# Patient Record
Sex: Male | Born: 1960
Health system: Southern US, Community
[De-identification: ages and names within clinical notes are randomized; demographics above are authoritative.]

## PROBLEM LIST (undated history)

## (undated) DIAGNOSIS — G4730 Sleep apnea, unspecified: Secondary | ICD-10-CM

## (undated) DIAGNOSIS — Z951 Presence of aortocoronary bypass graft: Secondary | ICD-10-CM

## (undated) DIAGNOSIS — I2510 Atherosclerotic heart disease of native coronary artery without angina pectoris: Secondary | ICD-10-CM

## (undated) DIAGNOSIS — E785 Hyperlipidemia, unspecified: Secondary | ICD-10-CM

## (undated) DIAGNOSIS — I10 Essential (primary) hypertension: Secondary | ICD-10-CM

## (undated) DIAGNOSIS — K4090 Unilateral inguinal hernia, without obstruction or gangrene, not specified as recurrent: Secondary | ICD-10-CM

## (undated) DIAGNOSIS — T7840XA Allergy, unspecified, initial encounter: Secondary | ICD-10-CM

## (undated) HISTORY — DX: Allergy, unspecified, initial encounter: T78.40XA

## (undated) HISTORY — DX: Presence of aortocoronary bypass graft: Z95.1

## (undated) HISTORY — DX: Essential (primary) hypertension: I10

## (undated) HISTORY — PX: OTHER SURGICAL HISTORY: SHX169

## (undated) HISTORY — DX: Sleep apnea, unspecified: G47.30

## (undated) HISTORY — DX: Unilateral inguinal hernia, without obstruction or gangrene, not specified as recurrent: K40.90

## (undated) HISTORY — PX: HERNIA REPAIR: SHX51

## (undated) HISTORY — DX: Atherosclerotic heart disease of native coronary artery without angina pectoris: I25.10

## (undated) HISTORY — PX: FRACTURE SURGERY: SHX138

## (undated) HISTORY — DX: Hyperlipidemia, unspecified: E78.5

---

## 1990-11-02 ENCOUNTER — Encounter: Payer: Self-pay | Admitting: Pulmonary Disease

## 1990-12-10 ENCOUNTER — Encounter: Payer: Self-pay | Admitting: Pulmonary Disease

## 1999-02-26 DIAGNOSIS — C4492 Squamous cell carcinoma of skin, unspecified: Secondary | ICD-10-CM

## 1999-02-26 HISTORY — DX: Squamous cell carcinoma of skin, unspecified: C44.92

## 2001-02-02 ENCOUNTER — Ambulatory Visit (HOSPITAL_BASED_OUTPATIENT_CLINIC_OR_DEPARTMENT_OTHER): Admission: RE | Admit: 2001-02-02 | Discharge: 2001-02-02 | Payer: Self-pay | Admitting: Otolaryngology

## 2004-06-12 ENCOUNTER — Encounter: Admission: RE | Admit: 2004-06-12 | Discharge: 2004-09-10 | Payer: Self-pay | Admitting: Family Medicine

## 2007-02-07 ENCOUNTER — Encounter: Payer: Self-pay | Admitting: Vascular Surgery

## 2007-02-07 ENCOUNTER — Inpatient Hospital Stay (HOSPITAL_COMMUNITY): Admission: EM | Admit: 2007-02-07 | Discharge: 2007-02-09 | Payer: Self-pay | Admitting: *Deleted

## 2007-02-07 ENCOUNTER — Ambulatory Visit: Payer: Self-pay | Admitting: Vascular Surgery

## 2007-06-29 ENCOUNTER — Ambulatory Visit: Payer: Self-pay | Admitting: Thoracic Surgery (Cardiothoracic Vascular Surgery)

## 2007-06-29 ENCOUNTER — Encounter: Payer: Self-pay | Admitting: Thoracic Surgery (Cardiothoracic Vascular Surgery)

## 2007-06-29 ENCOUNTER — Inpatient Hospital Stay (HOSPITAL_COMMUNITY): Admission: AD | Admit: 2007-06-29 | Discharge: 2007-07-06 | Payer: Self-pay | Admitting: Cardiovascular Disease

## 2007-06-30 ENCOUNTER — Encounter: Payer: Self-pay | Admitting: Thoracic Surgery (Cardiothoracic Vascular Surgery)

## 2007-07-08 ENCOUNTER — Ambulatory Visit: Payer: Self-pay | Admitting: Thoracic Surgery (Cardiothoracic Vascular Surgery)

## 2007-07-13 ENCOUNTER — Ambulatory Visit: Payer: Self-pay | Admitting: Cardiothoracic Surgery

## 2007-07-23 ENCOUNTER — Encounter (HOSPITAL_COMMUNITY): Admission: RE | Admit: 2007-07-23 | Discharge: 2007-10-07 | Payer: Self-pay | Admitting: Cardiovascular Disease

## 2007-07-31 ENCOUNTER — Ambulatory Visit: Payer: Self-pay | Admitting: Cardiothoracic Surgery

## 2007-07-31 ENCOUNTER — Encounter
Admission: RE | Admit: 2007-07-31 | Discharge: 2007-07-31 | Payer: Self-pay | Admitting: Thoracic Surgery (Cardiothoracic Vascular Surgery)

## 2007-10-08 ENCOUNTER — Encounter (HOSPITAL_COMMUNITY): Admission: RE | Admit: 2007-10-08 | Discharge: 2007-10-23 | Payer: Self-pay | Admitting: Cardiovascular Disease

## 2008-03-28 ENCOUNTER — Encounter: Admission: RE | Admit: 2008-03-28 | Discharge: 2008-03-28 | Payer: Self-pay | Admitting: Cardiovascular Disease

## 2008-11-14 ENCOUNTER — Ambulatory Visit (HOSPITAL_COMMUNITY): Admission: RE | Admit: 2008-11-14 | Discharge: 2008-11-14 | Payer: Self-pay | Admitting: General Surgery

## 2010-09-13 ENCOUNTER — Encounter: Payer: Self-pay | Admitting: Pulmonary Disease

## 2010-09-13 ENCOUNTER — Ambulatory Visit: Payer: Self-pay | Admitting: Cardiovascular Disease

## 2010-09-24 ENCOUNTER — Ambulatory Visit: Payer: Self-pay | Admitting: Pulmonary Disease

## 2010-09-24 DIAGNOSIS — G4733 Obstructive sleep apnea (adult) (pediatric): Secondary | ICD-10-CM | POA: Insufficient documentation

## 2010-09-24 DIAGNOSIS — J309 Allergic rhinitis, unspecified: Secondary | ICD-10-CM | POA: Insufficient documentation

## 2010-09-24 DIAGNOSIS — I10 Essential (primary) hypertension: Secondary | ICD-10-CM | POA: Insufficient documentation

## 2010-09-24 DIAGNOSIS — E785 Hyperlipidemia, unspecified: Secondary | ICD-10-CM | POA: Insufficient documentation

## 2010-09-25 ENCOUNTER — Encounter: Payer: Self-pay | Admitting: Pulmonary Disease

## 2010-09-25 ENCOUNTER — Ambulatory Visit (HOSPITAL_BASED_OUTPATIENT_CLINIC_OR_DEPARTMENT_OTHER)
Admission: RE | Admit: 2010-09-25 | Discharge: 2010-09-25 | Payer: Self-pay | Source: Home / Self Care | Attending: Pulmonary Disease | Admitting: Pulmonary Disease

## 2010-10-15 ENCOUNTER — Ambulatory Visit: Admit: 2010-10-15 | Payer: Self-pay | Admitting: Pulmonary Disease

## 2010-10-18 ENCOUNTER — Ambulatory Visit
Admission: RE | Admit: 2010-10-18 | Discharge: 2010-10-18 | Payer: Self-pay | Source: Home / Self Care | Attending: Pulmonary Disease | Admitting: Pulmonary Disease

## 2010-11-08 NOTE — Assessment & Plan Note (Signed)
Summary: consult for management of osa   Copy to:  Kristeen Miss Primary Provider/Referring Provider:  Theresia Lo  CC:  Sleep Consult.  History of Present Illness: The pt is a 49y/o male who I have been asked to see for management of osa.  He was diagnosed with osa of unknown severity in 2002, and started on cpap with success.  After his bypass surgery, the patient has lost over 135 pounds over the last 18mos.  Since that time, it has been difficult to wear cpap due to poorly fitting mask with leaks.  He currently is wearing about 5 times a month.  His wife states that he is still snoring, but it is unclear if he is still having an abnormal breathing pattern during the night.  When he does wear cpap, he thinks it does help his sleep.  Without cpap, he does feel relatively rested, but will have EDS if he is not being stimulated during the day.  He is going to bed at 11-36mn, and arises at 7am to start his day.  Again, if he sits and gets very quiet, he will get sleepy.  He can doze with tv/movies in the evening if he is uninterested.  He has no issues with driving.  Preventive Screening-Counseling & Management  Alcohol-Tobacco     Smoking Status: never  Current Medications (verified): 1)  Simvastatin 20 Mg Tabs (Simvastatin) .... Take 1 Tablet By Mouth Once A Day 2)  Aspirin 325 Mg Tabs (Aspirin) .... Take 1 Tablet By Mouth Once A Day 3)  Zyrtec Allergy 10 Mg Tabs (Cetirizine Hcl) .... Take 1 Tablet By Mouth Once A Day  Allergies (verified): 1)  ! Ceclor  Past History:  Past Medical History: CAD HYPERTENSION (ICD-401.9) ALLERGIC RHINITIS (ICD-477.9) HYPERLIPIDEMIA (ICD-272.4) OBSTRUCTIVE SLEEP APNEA (ICD-327.23)    Past Surgical History: heart bypass 06/2007 at [facility] Hernia repair Feb 2010  Family History: Reviewed history and no changes required. none per pt  Social History: Reviewed history and no changes required. Patient never smoked.  pt is married and lives with wife and  family. pt works as an Art gallery manager. Smoking Status:  never  Review of Systems  The patient denies shortness of breath with activity, shortness of breath at rest, productive cough, non-productive cough, coughing up blood, chest pain, irregular heartbeats, acid heartburn, indigestion, loss of appetite, weight change, abdominal pain, difficulty swallowing, sore throat, tooth/dental problems, headaches, nasal congestion/difficulty breathing through nose, sneezing, itching, ear ache, anxiety, depression, hand/feet swelling, joint stiffness or pain, rash, change in color of mucus, and fever.    Vital Signs:  Patient profile:   50 year old male Height:      69 inches Weight:      162 pounds BMI:     24.01 O2 Sat:      99 % on Room air Temp:     97.7 degrees F oral Pulse rate:   71 / minute BP sitting:   128 / 84  (left arm) Cuff size:   regular  Vitals Entered By: Arman Filter LPN (September 24, 2010 8:53 AM)  O2 Flow:  Room air CC: Sleep Consult Comments Medications reviewed with patient Arman Filter LPN  September 24, 2010 8:53 AM    Physical Exam  General:  wd male in nad Eyes:  PERRLA and EOMI.   Nose:  deviated septum to left with narrowing. no definite obstruction. Mouth:  normal palate and uvula Neck:  no jvd, tmg, LN Lungs:  clear to auscultation Heart:  rrr,  no mrg Abdomen:  soft and nontender, bs+ Extremities:  no edema on left, mild right ankle edema. No cyanosis, pulses intact distally Neurologic:  alert, not sleepy, moves all 4.   Impression & Recommendations:  Problem # 1:  OBSTRUCTIVE SLEEP APNEA (ICD-327.23) the pt has a h/o osa of unknown severity, but has lost over 135 pounds.  His symptoms are much improved, but he still has snoring with some daytime sleepiness with periods of inactivity.  It is really unclear whether sleep apnea is still an issue for him, and to what degree.  He is willing to try cpap again with updated equipment if that is his only option.   However, he would like to look at other options if he still has clinically significant sleep apnea.  Will set up for a sleep study, and arrange f/u once the results are available.  Medications Added to Medication List This Visit: 1)  Simvastatin 20 Mg Tabs (Simvastatin) .... Take 1 tablet by mouth once a day 2)  Aspirin 325 Mg Tabs (Aspirin) .... Take 1 tablet by mouth once a day 3)  Zyrtec Allergy 10 Mg Tabs (Cetirizine hcl) .... Take 1 tablet by mouth once a day  Other Orders: Consultation Level IV (78295) Sleep Disorder Referral (Sleep Disorder)  Patient Instructions: 1)  will set up for a sleep study to evaluate your degree of sleep apnea at this time. 2)  will arrange followup once results available.

## 2010-11-08 NOTE — Letter (Signed)
Summary: [facility]   Imported By: Sherian Rein 10/02/2010 11:24:52  _____________________________________________________________________  External Attachment:    Type:   Image     Comment:   External Document

## 2010-11-08 NOTE — Assessment & Plan Note (Signed)
Summary: rov for review of recent sleep study   Copy to:  Kristeen Miss Primary Provider/Referring Provider:  Theresia Lo  CC:  Ov to discuss sleep study results. .  History of Present Illness: The pt comes in today for f/u of his recent sleep study.  He was found to have an AHI 18/hr with mild desat to 81%.  I have reviewed the study in detail with him, and answered all of his questions.    Medications Prior to Update: 1)  Simvastatin 20 Mg Tabs (Simvastatin) .... Take 1 Tablet By Mouth Once A Day 2)  Aspirin 325 Mg Tabs (Aspirin) .... Take 1 Tablet By Mouth Once A Day 3)  Zyrtec Allergy 10 Mg Tabs (Cetirizine Hcl) .... Take 1 Tablet By Mouth Once A Day  Allergies (verified): 1)  ! Ceclor  Review of Systems  The patient denies shortness of breath with activity, shortness of breath at rest, productive cough, non-productive cough, coughing up blood, chest pain, irregular heartbeats, acid heartburn, indigestion, loss of appetite, weight change, abdominal pain, difficulty swallowing, sore throat, tooth/dental problems, headaches, nasal congestion/difficulty breathing through nose, sneezing, itching, ear ache, anxiety, depression, hand/feet swelling, joint stiffness or pain, rash, change in color of mucus, and fever.    Vital Signs:  Patient profile:   50 year old male Height:      69 inches Weight:      164.13 pounds BMI:     24.33 O2 Sat:      100 % on Room air Temp:     98.4 degrees F oral Pulse rate:   80 / minute BP sitting:   128 / 78  (left arm) Cuff size:   regular  Vitals Entered By: Arman Filter LPN (October 18, 2010 4:01 PM)  O2 Flow:  Room air CC: Ov to discuss sleep study results.  Comments Medications reviewed with patient Arman Filter LPN  October 18, 2010 4:01 PM    Physical Exam  General:  thin male in nad Nose:  no skin breakdown or pressure necrosis from cpap mask Extremities:  no edema or cyanosis  Neurologic:  alert, does not appear sleepy, moves all  4.   Impression & Recommendations:  Problem # 1:  OBSTRUCTIVE SLEEP APNEA (ICD-327.23) the pt has mild osa by his most recent sleep study, and I have reviewed the various treatment options available for him.  One is to stop cpap and try to stay off back as much as possible (his apnea was primarily in the supine position).  The other options would be to consider surgery, dental appliance, and remaining on cpap.  I really think a dental appliance would be his best option if he wishes to try and come off cpap.  The pt feels he is symptomatic enough to continue aggressive treatment of his osa, and will therefore send him for a dental evaluation.  Other Orders: Est. Patient Level III (02725) Dental Referral (Dentist)  Patient Instructions: 1)  will refer you for a dental evaluation. 2)  please let me know if you would like to consider ENT evaluation as well. 3)  try to sleep on side or abdomen as much as possible.   Immunization History:  Influenza Immunization History:    Influenza:  historical (08/07/2010)

## 2011-01-22 LAB — COMPREHENSIVE METABOLIC PANEL
ALT: 16 U/L (ref 0–53)
AST: 17 U/L (ref 0–37)
Albumin: 3.7 g/dL (ref 3.5–5.2)
Alkaline Phosphatase: 50 U/L (ref 39–117)
BUN: 14 mg/dL (ref 6–23)
CO2: 30 mEq/L (ref 19–32)
Calcium: 9.2 mg/dL (ref 8.4–10.5)
Chloride: 107 mEq/L (ref 96–112)
Creatinine, Ser: 1.15 mg/dL (ref 0.4–1.5)
GFR calc Af Amer: >60 mL/min (ref >60)
GFR calc non Af Amer: >60 mL/min (ref >60)
Glucose, Bld: 93 mg/dL (ref 70–99)
Potassium: 3.9 mEq/L (ref 3.5–5.1)
Sodium: 141 mEq/L (ref 135–145)
Total Bilirubin: 1.2 mg/dL (ref 0.3–1.2)
Total Protein: 5.9 g/dL — ABNORMAL LOW (ref 6.0–8.3)

## 2011-01-22 LAB — DIFFERENTIAL
Basophils Absolute: 0 10*3/uL (ref 0.0–0.1)
Basophils Relative: 1 % (ref 0–1)
Eosinophils Absolute: 0.1 10*3/uL (ref 0.0–0.7)
Eosinophils Relative: 3 % (ref 0–5)
Lymphocytes Relative: 30 % (ref 12–46)
Lymphs Abs: 1.6 10*3/uL (ref 0.7–4.0)
Monocytes Absolute: 0.4 10*3/uL (ref 0.1–1.0)
Monocytes Relative: 8 % (ref 3–12)
Neutro Abs: 3 10*3/uL (ref 1.7–7.7)
Neutrophils Relative %: 58 % (ref 43–77)

## 2011-01-22 LAB — CBC
HCT: 40.4 % (ref 39.0–52.0)
Hemoglobin: 13.7 g/dL (ref 13.0–17.0)
MCHC: 33.9 g/dL (ref 30.0–36.0)
MCV: 92.3 fL (ref 78.0–100.0)
Platelets: 156 10*3/uL (ref 150–400)
RBC: 4.37 MIL/uL (ref 4.22–5.81)
RDW: 13.4 % (ref 11.5–15.5)
WBC: 5.2 10*3/uL (ref 4.0–10.5)

## 2011-01-22 LAB — PROTIME-INR
INR: 1 (ref 0.00–1.49)
Prothrombin Time: 13.5 seconds (ref 11.6–15.2)

## 2011-02-19 NOTE — Op Note (Signed)
NAME:  [name], [name] NO.:  [id number]   MEDICAL RECORD NO.:  [id number]          PATIENT TYPE:  INP   LOCATION:  2302                         FACILITY:  [facility]   PHYSICIAN:  Bedelia Person, M.D.        DATE OF BIRTH:  January 30, 1961   DATE OF PROCEDURE:  07/02/2007  DATE OF DISCHARGE:                               OPERATIVE REPORT   Prior to surgical procedure the patient was quizzed about possible  esophageal or gastric medical conditions.  He denies all such.  The  patient was taken to the operating room and induced with general  anesthesia.  The airway was secured with an oral endotracheal tube.  Gastric contents were suctioned with an oral gastric tube which was then  removed.  The transesophageal probe was heavily lubricated, placed in a  sleeve which was also lubricated and then placed blindly down the  oropharynx palpebral pharynx without resistance.  The remained at the 45  to 50 cm mark throughout the case.  At completion of the case the probe  was removed.  There was no evidence for oral or pharyngeal damage.  During the bypass period, the probe was left in the neutral unflexed  position.  Prebypass examination revealed the left ventricle to be  normal thickness.  Contractility was good.  There were no segmental  defects.  The left atrium was normal size.  The appendage was clean.  The inner atrial septum was intact.  The mitral valve leaflets were  opening and closing appropriately.  They were thin with no masses noted.  There was no calcification of the annulus.  The leaflets appeared to be  coapting within the valvular plane.  Color Doppler revealed no mitral  insufficiency.  The aortic valve had three leaflets.  There was no  calcification noted.  All three leaflets opened and closed  appropriately.  Color Doppler revealed no aortic insufficiency.  Tricuspid valve appeared normal.  Swan-Ganz catheter was noted across  the valve.  Color Doppler did document  trace regurgitant flow again with  the Swan-Ganz catheter.  The patient underwent two-vessel coronary  artery bypass grafting off pump.  At the completion of the procedure,  left ventricle was very slightly more hypokinetic than at the start of  the procedure.  This quickly return to normal after completing the  procedure.  The postprocedure examination revealed no significant  changes from the initial examination __________ _           ______________________________  Bedelia Person, M.D.     LK/MEDQ  D:  07/02/2007  T:  07/03/2007  Job:  [id number]

## 2011-02-19 NOTE — Discharge Summary (Signed)
[name], [name] NO.:  [id number]   MEDICAL RECORD NO.:  [id number]          PATIENT TYPE:  INP   LOCATION:  2027                         FACILITY:  [facility]   PHYSICIAN:  Salvatore Decent. Cornelius Moras, M.D. DATE OF BIRTH:  01-Sep-1961   DATE OF ADMISSION:  06/29/2007  DATE OF DISCHARGE:  07/06/2007                               DISCHARGE SUMMARY   HISTORY OF PRESENT ILLNESS:  The patient is a 50 year old male, referred  to Dr. Elease Hashimoto for evaluation of chest pain.  He was found to have an  abnormal stress Cardiolite study.  He was admitted this hospitalization  for cardiac catheterization.  The patient had had a history of multiple  risk factors including hypertension and obesity.  He also has  gastroesophageal reflux.  Approximately a month prior to evaluation, he  started having episodes of chest pain that were felt to be somewhat  unusual.  They would typically come on after he had eaten after  approximately 1 hour.  It was also associated with tightness-type  sensation as well as shoulder and jaw discomfort.  He noticed over time  that the episodes would occurred during walking, and they were gradually  worsening.  He was recently on an antacid and the pain following meals  improved.  The pain associated with exertion did not.  He was admitted  this hospitalization, as stated, for cardiac catheterization.  The  Cardiolite study was notable for a marked defect in the anterior apical  and septal and septal walls.   MEDICATIONS PRIOR TO ADMISSION:  1. Aspirin 81 mg daily.  2. Hydrochlorothiazide 25 mg daily.  3. Lotensin once daily.  4. Prilosec 20 mg daily.   ALLERGIES:  CECLOR causes photosensitivity, but no true allergies noted.   PAST MEDICAL HISTORY:  1. Hypertension.  2. Reflux.  3. Abnormal Cardiolite study.   Family history, social history, review of symptoms and physical exam,  please see the history and physical done at time of admission.   HOSPITAL  COURSE:  The patient was admitted and did undergo cardiac  catheterization.  Findings included a 100% proximal occlusion the left  anterior descending coronary artery.  Due to this finding, an attempt  was made at percutaneous intervention but the guide wire could not cross  the lesion.  Due to this, cardiac surgical consultation was obtained  with Tressie Stalker, MD who evaluated the patient and studies, and  recommended proceeding with surgical revascularization.  The patient did  require a few days for attempted Plavix washout, but it was deemed that  on July 02, 2007 he was acceptable for proceeding with the surgical  intervention procedure.  On July 02, 2007, he was taken to the  operating room, at which time eh underwent the following procedure:  off-  pump coronary artery bypass grafting x2.  The following grafts placed:  1. Left internal mammary artery to the LAD.  2. Saphenous vein graft to diagonal.   The patient tolerated the procedure well, was taken to the surgical  intensive care unit in stable condition.   POSTOPERATIVE HOSPITAL COURSE:  The  patient was extubated without  difficulty.  He was has remained hemodynamically stable.  All routine  lines, monitors, drainage devices and inotropic support was discontinued  without difficulty.  Initially, he did require some atrial pacing for a  borderline sinus bradycardia, but this has improved and he has  maintained normal sinus rhythm without significant ectopy or  dysrhythmias.  His incisions are all healing well without evidence of  infection.  He is tolerating diet and activity.  Commensurate for level  of postoperative convalescence using standard protocols.  He is  afebrile.  His oxygen has been weaned.  He maintains good saturations on  room air.  He does have a mild postoperative anemia, but his laboratory  values are stable.  Most recent hemoglobin and hematocrit dated  July 06, 2007 are 11.6 and 34.2  respectively.  Electrolytes, BUN  and creatinine are all within normal limits.  His overall status is felt  to be quite stable for discharge on July 06, 2007.  The patient on  discharge stable and improving.   FINAL DIAGNOSIS:  Severe single-vessel coronary artery disease, now  status post surgical revascularization as described.  Other diagnoses  include hypertension, gastroesophageal reflux, hyperlipidemia, history  of obstructive sleep apnea, morbid obesity, mild postoperative anemia.   INSTRUCTIONS:  The patient received written instructions regarding  medications, activity, diet, wound care and followup.   Medications at time of this discharge include:  1. Aspirin 325 mg daily.  2. Toprol XL 25 mg daily.  3. Lasix 40 mg daily for 7 days.  4. K-Dur 20 mEq daily for 7 days.  5. Oxycodone 5 mg 1-2 q.4-6 hours p.r.n. as needed.  6. Lipitor 40 mg daily.  7. Prilosec 20 mg daily.      Rowe Clack, P.A.-C.      Salvatore Decent. Cornelius Moras, M.D.  Electronically Signed    WEG/MEDQ  D:  07/06/2007  T:  07/06/2007  Job:  [id number]   cc:   Salvatore Decent. Cornelius Moras, M.D.  Vesta Mixer, M.D.  Vikki Ports, M.D.

## 2011-02-19 NOTE — Op Note (Signed)
NAME:  [name], [name] NO.:  [id number]   MEDICAL RECORD NO.:  [id number]          PATIENT TYPE:  INP   LOCATION:  2302                         FACILITY:  [facility]   PHYSICIAN:  Salvatore Decent. Cornelius Moras, M.D. DATE OF BIRTH:  05-17-1961   DATE OF PROCEDURE:  07/02/2007  DATE OF DISCHARGE:                               OPERATIVE REPORT   PREOPERATIVE DIAGNOSIS:  Severe single-vessel coronary artery disease.   POSTOPERATIVE DIAGNOSIS:  Severe single-vessel coronary artery disease.   PROCEDURE:  Median sternotomy for off-pump coronary artery bypass  grafting x2 (left internal mammary artery to distal left anterior  descending coronary artery, saphenous vein graft to first diagonal  branch, endoscopic saphenous vein harvest from right thigh)   SURGEON:  Salvatore Decent. Cornelius Moras, M.D.   ASSISTANT:  Salvatore Decent. Dorris Fetch, M.D.   SECOND ASSISTANT:  Mr. Tamsen Roers.   ANESTHESIA:  General.   BRIEF CLINICAL NOTE:  The patient is a 50 year old obese white male with  no previous history of coronary artery disease who presents with  symptoms of substernal chest pain.  Some of the patient's symptoms are  atypical but the patient also has developed pain in the upper chest  radiating to the jaw with physical activity.  A stress test was  performed demonstrating anterior wall and apical ischemia.  The patient  underwent cardiac catheterization by Dr. Delane Ginger and was found to  have occlusion of the left anterior descending coronary artery with high-  grade stenosis of the first diagonal branch.  An attempt at percutaneous  coronary intervention was performed, but the occlusion of the left  anterior descending coronary artery could not be crossed and is  presumably chronic.  Left ventricular function is normal.  A full  consultation note has been dictated previously.  Alternative treatment  strategies have been discussed at length with the patient and his  family.  The relative risks and  benefits of proceeding with surgical  revascularization have been discussed.  The patient understands and  accepts all potential associated risks of surgery and desires to proceed  as described.   OPERATIVE FINDINGS:  1. Normal left ventricular function.  2. Good quality left internal mammary artery and saphenous vein      conduit for grafting.  3. Small caliber distal coronary vessels.   OPERATIVE NOTE:  The patient is brought to the operating room on the  above-mentioned date and central monitoring was established by the  anesthesia service under the care and direction of Dr. Bedelia Person.  Specifically, a Swan-Ganz catheter is placed through the right internal  jugular approach.  A radial arterial line is placed.  Intravenous  antibiotics were administered.  Following induction with general  endotracheal anesthesia, a Foley catheter is placed.  The patient's  chest, abdomen, both groins, and both lower extremities are prepared and  draped in sterile manner.  Baseline transesophageal echocardiogram was  performed by Dr. Gypsy Balsam.  This demonstrates normal left ventricular size  and function.  There are no valvular abnormalities to speak of.   A median sternotomy incision is performed and left internal mammary  artery is dissected from the chest wall and prepared for bypass  grafting.  The left internal mammary artery is good quality conduit.  Simultaneously saphenous vein is obtained from the patient's right thigh  using endoscopic vein harvest technique through a small incision made  just above the right knee.  The saphenous vein is somewhat large caliber  but otherwise good-quality conduit.  After the saphenous vein is been  removed, the small incision in the right thigh is closed in multiple  layers with running absorbable suture.  The patient is heparinized  systemically and the left internal mammary artery transected distally.  It is noted to have excellent flow.   The pericardium  is opened.  The ascending aorta is normal in appearance.  The Guidant Acrobat cardiac stabilization system is utilized to  facilitate off-pump coronary artery bypass surgery.  Both the apical  suction cup and the U-shaped stabilization bar are utilized.  Elastic  vessel loops are used for proximal and distal control and intracoronary  shunts are not utilized.  The patient is placed in steep Trendelenburg  position with the table rotated towards the surgeon's side to facilitate  exposure.  The patient tolerated construction of distal ends anastomoses  extremely well with no hemodynamic compromise.   The following distal coronary anastomoses were performed:  1. The first diagonal branch is grafted with a saphenous vein graft in      end-to-side fashion.  This vessel measures 1.2 mm in diameter and      is a fair quality target vessel for grafting.  2. The distal left anterior descending coronary artery is grafted with      left internal mammary artery in end-to-side fashion.  This vessel      measures 1.4 mm in diameter and is a fair to good quality target      vessel for grafting.   The table was returned to neutral position.  The single proximal  saphenous vein anastomoses is performed directly to the ascending aorta  using the Guidant heart string proximal punch and hemostatic system  without need for any clamp application to the aorta.  After the  completion of the anastomosis, protamine was administered to reverse  anticoagulation.  The proximal and both distal coronary anastomoses are  inspected for hemostasis and appropriate graft orientation.  Epicardial  pacing wires are fixed to the right atrial appendage.  The patient's  chest is irrigated with saline solution and meticulous surgical  hemostasis is ascertained.  The mediastinum and left chest are drained  using three chest tubes exited through separate stab incisions  inferiorly.  The sternum was closed with double-strength  sternal wire  after reapproximating the pericardium and soft tissues anterior to the  aorta loosely.  This soft tissues anterior to the sternum are closed in  multiple layers and the skin is closed with running subcuticular skin  closure.   The patient tolerated the procedure well and was transported to the  surgical intensive care unit in stable condition.  There are no  intraoperative complications.  All sponge, instrument and needle counts  were verified correct at completion of the operation.  No blood products  were administered.      Salvatore Decent. Cornelius Moras, M.D.  Electronically Signed     CHO/MEDQ  D:  07/02/2007  T:  07/03/2007  Job:  [id number]   cc:   Vesta Mixer, M.D.  Vikki Ports, M.D.

## 2011-02-19 NOTE — Assessment & Plan Note (Signed)
OFFICE VISIT   [name], [name]  DOB:  October 13, 1960                                        July 31, 2007  CHART #:  [id number]   HISTORY OF PRESENT ILLNESS:  The patient is a 50 year old male now 1  month status post coronary artery bypass grafting x2 by Dr. Cornelius Moras.  The  patient is a 50 year old obese white male with no previous history of  coronary disease who presented with increasing symptoms of substernal  chest pain.  Stress test was positive for anterior wall and apical  ischemia.  Cardiac catheterization confirmed 100% occlusion to the LAD.  This was not amenable to PTCA due to inability to pass the wire.  He  subsequently underwent surgical revascularization including left  internal mammary artery to the distal left anterior descending coronary  artery, and a saphenous vein graft to the first diagonal branch on  07/02/2007.  He was discharged on 07/06/2007 doing well.  He returns to  the office on today's date, continuing to do quite well.  He has minor  discomforts and is currently not using any pain medicine.  He describes  some symptoms of itching relating to the sternal incision.  The chest  tube sites have been slow to heal.  He has been packing them with wet-to-  dry dressings up until recently.  Now, he covers them with a dry sterile  dressing as they have shown significant healing with good granulation  tissue.  He has started outpatient cardiac rehabilitation.  He is  performing routine activities and ambulation.  He was seen by Dr. Elease Hashimoto  in cardiology followup, and there have been no significant changes since  that visit.   PHYSICAL EXAM:  Blood pressure 146/76, pulse 62 and regular,  respirations 18, oxygen saturation is 99% on room air.  In general, this  is a moderately obese male in no acute distress.  Pulmonary:  Clear  lungs.  Cardiac:  Regular rate and rhythm without murmurs, gallops, or  rubs.  Extremities:  Minimal edema.   Incisions are all healing well  without evidence of infection.   Chest x-ray is reviewed and reveals clear lungs with no abnormal  findings.   ASSESSMENT:  Continuing to do well following surgical revascularization  as described above.  He is instructed to continue his rehabilitation  activities.  He is informed that he can return to driving.  He is  instructed not to fly in an airplane, which he does frequently on  business, until the first portion of next year.  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  Return to the clinic will be on a p.r.n. basis  if he has any difficulties related to his incisions or other surgical  matters.  He will otherwise continue to follow up with Dr. Elease Hashimoto as he  requires for ongoing cardiology care.   The patient was seen and evaluated this visit by Dr. Kathlee Nations Trigt.   Rowe Clack, P.A.-C.   Sherryll Burger  D:  07/31/2007  T:  08/01/2007  Job:  [id number]   cc:   Salvatore Decent. Cornelius Moras, M.D.  Vesta Mixer, M.D.  Vikki Ports, M.D.

## 2011-02-19 NOTE — Cardiovascular Report (Signed)
NAME:  CHER, EGNOR NO.:  [id number]   MEDICAL RECORD NO.:  [id number]          PATIENT TYPE:  OIB   LOCATION:  2928                         FACILITY:  [facility]   PHYSICIAN:  Vesta Mixer, M.D. DATE OF BIRTH:  Feb 12, 1961   DATE OF PROCEDURE:  06/29/2007  DATE OF DISCHARGE:                            CARDIAC CATHETERIZATION   HISTORY OF PRESENT ILLNESS:   Mr. Spragg is a 50 year old gentleman with a history of angina for the  past 6 weeks.  He recently presented to me in the office.  He had a  stress Cardiolite study which revealed a marked septal anterior wall and  apical defect consistent with ischemia.  He is referred for heart  catheterization.   PROCEDURE:  Left heart catheterization with coronary angiography with  PTCA attempt to the LAD.   The right femoral artery was easily cannulated using modified Seldinger  technique.   HEMODYNAMIC RESULTS:  LV pressure is 116/16 with an aortic pressure of  113/67.   ANGIOGRAPHY:  Left main:  Left main is fairly normal.   The left anterior descending artery has 99% subtotal occlusion in the  proximal aspect.  This stenosis is located at the origin of first  diagonal vessel.  There is TIMI grade I flow down the LAD and diagonal  vessel.  The diagonal vessel originates in the middle of the vessel and  is a relatively small vessel.  The mid and distal LAD cannot be seen and  fill only very sluggishly the antegrade flow.   The ramus intermediate vessel is a moderate-sized vessel with minor  luminal irregularities.   The left circumflex artery is a large vessel.  There are minor luminal  irregularities.   The first obtuse marginal artery is relatively small.  The second obtuse  marginal artery is fairly large and normal.   The right coronary artery is a large and dominant vessel.  It is fairly  normal throughout its course.  The posterior descending artery and  posterolateral segment artery are normal.    There are collaterals from the right that fill via the septal branches  into the LAD.   PCI attempt:  We used multiple guiding catheter.  The best fit was a 6-  [place] XB LAD VISTA guiding wire.  We also used multiple wires, but were  unsuccessful in our attempts.  We tried a [employer] wire Luge [employer].  This Ginger Blue gave [place] the most favorable approach but  again was unsuccessful.   After an hour and a half of attempts, we decided to stop the case.   At the end of case, there was no flow down the LAD.  The patient does  have collaterals from the right coronary artery and is stable.  There is  no increase in his chest pain.  We will refer him for coronary artery  bypass grafting.   The patient did receive 600 mg of Plavix at the beginning of the case.  He also received AngioMax which has now been discontinued.  ______________________________  Vesta Mixer, M.D.     PJN/MEDQ  D:  06/29/2007  T:  06/29/2007  Job:  [id number]   cc:   Dr. Cornelius Moras

## 2011-02-19 NOTE — Op Note (Signed)
NAME:  JAMISON, SOWARD NO.:  [id number]   MEDICAL RECORD NO.:  [id number]          PATIENT TYPE:  AMB   LOCATION:  DAY                          FACILITY:  [facility]   PHYSICIAN:  Adolph Pollack, M.D.DATE OF BIRTH:  03-02-1961   DATE OF PROCEDURE:  11/14/2008  DATE OF DISCHARGE:                               OPERATIVE REPORT   PREOPERATIVE DIAGNOSIS:  Right inguinal hernia.   POSTOPERATIVE DIAGNOSIS:  Right inguinal hernia.   PROCEDURE:  Right inguinal hernia repair with mesh.   SURGEON:  Adolph Pollack, M.D.   ANESTHESIA:  General/LMA and Marcaine local.   INDICATIONS:  This 50 year old male noted some discomfort and swelling  in the right groin area.  He has a right inguinal hernia that is  symptomatic and reducible on exam.  He now presents for repair.  The  procedure and the risks were discussed with him preop.  We also talked  about aftercare.   TECHNIQUE:  He was seen in the holding area and the right groin marked  with my initials.  He was then brought to the operating room, placed  supine on the operating room table and he was given general anesthesia  by way of LMA.  The hair on the left groin was clipped and the area was  sterilely prepped and draped.  Marcaine solution was infiltrated  superficially and deep in the right groin.  A right groin incision was  made through the skin and subcutaneous tissue until the external oblique  aponeurosis was exposed.  Local anesthetic was infiltrated deep to the  external oblique aponeurosis.  The external oblique aponeurosis was then  incised through the external ring medially and up toward the anterior  superior iliac spine laterally.  Using blunt dissection, I identified  the internal oblique aponeurosis and muscle superiorly and the shelving  edge of the inguinal ligament inferiorly.  I then encircled the  spermatic cord.  I noted an indirect hernia, as well as a direct hernia.   I separated the  indirect hernia contents from the cord and also  separated the direct hernia sac from the cord.  I reduced the indirect  hernia contents consisting of the extraperitoneal fat through a patulous  internal ring.  I then retracted the cord structures anteriorly.   A piece of 3 x 6 inches polypropylene mesh was brought into the field  and anchored 2 cm medial to the pubic tubercle with 2-0 Prolene suture.  The inferior aspect of the mesh was anchored to the shelving edge of the  inguinal ligament with a running 2-0 Prolene suture up to the level 2 cm  lateral to the internal ring.  A slit was cut in the mesh creating two  tails which were wrapped around the spermatic cord.  The superior aspect  of the mesh was anchored to the internal oblique aponeurosis with  interrupted 2-0 Vicryl sutures.  The two tails of the mesh were crossed  creating a new internal ring and these were anchored to the shelving  edge of the inguinal ligament with a 2-0 Prolene suture.  The tip of a  hemostat was able to be placed through the new aperture.   The lateral aspect of the mesh was then tucked deep to the external  oblique aponeurosis.  The wound was inspected and hemostasis was  adequate.  The ilioinguinal nerve was kept with the spermatic cord  structures.   Following this, the external oblique aponeurosis was closed over the  mesh and spermatic cord with running 3-0 Vicryl suture.  The  subcutaneous tissue was reapproximated with a running 2-0 Vicryl suture.  The skin was closed with 4-0 Monocryl subcuticular stitch.  Steri-Strips  and sterile dressings were applied.  He tolerated the procedure without  any apparent complications and was taken to the recovery room in  satisfactory condition.  The right testicle was in the scrotum.      Adolph Pollack, M.D.  Electronically Signed     TJR/MEDQ  D:  11/14/2008  T:  11/14/2008  Job:  [id number]   cc:   Vikki Ports, M.D.  Fax: [phone]    Vesta Mixer, M.D.  Fax: [phone]

## 2011-02-19 NOTE — Consult Note (Signed)
NAME:  [name], [name] NO.:  [id number]   MEDICAL RECORD NO.:  [id number]          PATIENT TYPE:  OIB   LOCATION:  2928                         FACILITY:  [facility]   PHYSICIAN:  [name], M.D. DATE OF BIRTH:  11/07/1960   DATE OF CONSULTATION:  06/29/2007  DATE OF DISCHARGE:                                 CONSULTATION   CARDIOTHORACIC SURGERY CONSULTATION:   REASON FOR CONSULTATION:  Critical single-vessel coronary artery  disease.   REFERRING PHYSICIAN:  Vesta Petersen, M.D.   HISTORY OF PRESENT ILLNESS:  [name] is a 50 year old obese white  male with no previous history of coronary artery disease, who describes  a 1-54-month history of intermittent episodes of atypical chest pain.  The patient initially developed epigastric burning pain that seemed to  be associated with meals.  He had had some problems with bowel function  earlier in the summer and he thought this was probably GI-related.  However, more recently he developed some chest tightness and shoulder  and jaw pain while walking, and these symptoms have gradually gotten  worse.  He was initially seen by his primary care physician, [name], and he was started on oral Prilosec.  He was referred for a  cardiac evaluation and he underwent a stress Cardiolite exam that was  markedly abnormal and consistent with anterior wall ischemia.  He  subsequently underwent elective cardiac catheterization today by Dr.  Elease Petersen.  He was found to have 100% proximal occlusion of the left  anterior descending coronary artery.  He was loaded with Plavix and an  attempt to cross this lesion was performed, but the guidewire could not  be passed across the lesion.  Cardiac surgical consultation has now been  requested.   REVIEW OF SYSTEMS:  GENERAL:  The patient reports stable appetite.  He  is 5 feet 9 inches tall and weighs in excess of 260 pounds.  CARDIAC:  Notable for intermittent symptoms of  burning substernal and epigastric  pain as well as tightness in the upper chest and jaw.  The tightness in  upper chest and jaw seems to be more related to physical activity,  whereas the burning epigastric pain is more related to meals.  The  patient reports mild exertional shortness of breath, which is stable.  The patient denies severe exertional shortness of breath.  The patient  denies resting shortness of breath.  He reports no PND, orthopnea, or  lower extremity edema.  He has not had any palpitations or syncope.  RESPIRATORY:  Notable for exertional shortness of breath.  The patient  also has obstructive sleep apnea and uses CPAP at night.  He denies  productive cough, hemoptysis, wheezing.  GASTROINTESTINAL:  Notable for  postprandial burning epigastric pain.  The patient denies difficulty  swallowing.  The patient reports no hematochezia, hematemesis, nor  melena.  MUSCULOSKELETAL:  Negative.  GENITOURINARY:  Negative.  HEENT:  Negative.  PSYCHIATRIC:  Negative.   PAST MEDICAL HISTORY:  1. Hypertension.  2. GE reflux disease.  3. Hyperlipidemia.  4. Obstructive sleep apnea.  5. Obesity.  PAST SURGICAL HISTORY:  Excision of fistula in ano.   FAMILY HISTORY:  Noncontributory.   SOCIAL HISTORY:  The patient is married and lives with his wife and one  of their five children here in [place].  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  He is a nonsmoker and he denies alcohol  consumption.   MEDICATIONS PRIOR TO ADMISSION:  1. Aspirin 81 mg daily.  2. Hydrochlorothiazide 25 mg daily.  3. Lotensin 10 mg daily.  4. Prilosec 20 mg daily  5. Lipitor 40 mg daily.   DRUG ALLERGIES:  CECLOR causes photosensitivity.   PHYSICAL EXAM:  The patient is a well-appearing, obese white male who  appears his stated age, in no acute distress.  He is in sinus rhythm and blood pressure is 135/83.  HEENT:  Unrevealing.  NECK:  Supple.  There is no cervical nor  supraclavicular  lymphadenopathy.  There is no jugular venous distention.  No carotid  bruits are noted.  Auscultation of the chest demonstrates clear breath sounds which are  symmetrical bilaterally.  No wheezes or rhonchi are noted.  CARDIOVASCULAR:  Regular rate and rhythm.  No murmurs, rubs or gallops  are noted.  ABDOMEN:  Soft, nontender.  Bowel sounds are present.  EXTREMITIES:  Warm and well-perfused.  There is no lower extremity  edema.  Distal pulses are palpable in the dorsalis pedis position.  There is no sign of significant venous insufficiency.  SKIN:  Clean, dry, and healthy-appearing throughout.Marland Kitchen  RECTAL:  Deferred.  GENITOURINARY:  Deferred.  NEUROLOGIC:  Grossly nonfocal and symmetrical throughout.   DIAGNOSTIC TESTS:  Cardiac catheterization performed by Dr. Elease Petersen is  reviewed.  This demonstrates 100% occlusion of the mid left anterior  descending coronary artery.  The distal vessel fills via left-to-left  collaterals and appears to a be reasonably good target vessel.  There is  subtotal occlusion of the large first diagonal branch as well and  filling of this is decreased after attempted percutaneous coronary  intervention.  There is no significant left circumflex or right coronary  artery disease.  Left ventricular function appears normal.   IMPRESSION:  Severe, critical left anterior descending coronary artery  stenosis with progressive symptoms of chest pain consistent with angina  pectoris as well as other additional symptoms of atypical chest pain  that could be cardiac-related or may well also represent  gastroesophageal reflux symptoms.  I believe the patient would benefit  from surgical revascularization.   PLAN:  I have discussed options at length with [name] and his  family.  The relative risks and benefits of surgery have been contrasted  with continued medical therapy.  All of their questions have been  addressed.  We tentatively plan to  proceed with surgery later this week  in an effort to allow the effects of Plavix to dissipate from his  system.  However, I am concerned regarding the potential compromise of  the large first diagonal branch and if the patient has truly symptoms  compatible with unstable angina uncontrolled with intravenous  nitroglycerin, we could proceed with surgery sooner if need be.  I have  discussed these matters with Mr. Leabo.  They understand  and accept all potential associated risks of surgery including but not  limited to risk of death, stroke, myocardial infarction, congestive  heart failure, respiratory failure, pneumonia, bleeding requiring blood  transfusion, arrhythmia, infection, and recurrent coronary artery  disease.  All of their questions have been  addressed.      [name], M.D.  Electronically Signed     CHO/MEDQ  D:  06/29/2007  T:  06/30/2007  Job:  [id number]   cc:   [name], M.D.  Vikki Ports, M.D.

## 2011-02-19 NOTE — H&P (Signed)
NAME:  [name], [name] NO.:  [id number]   MEDICAL RECORD NO.:  [id number]          PATIENT TYPE:  OIB   LOCATION:  NA                           FACILITY:  [facility]   PHYSICIAN:  Vesta Mixer, M.D. DATE OF BIRTH:  11-Mar-1961   DATE OF ADMISSION:  DATE OF DISCHARGE:                              HISTORY & PHYSICAL   Mr. Malacai Grantz is a 50 year old gentleman who we were asked to  see for episodes of chest pain.  He was found to have an abnormal stress  Cardiolite study.  He is now admitted for heart catheterization.   Nida Boatman is a 50 year old gentleman with a history of hypertension, obesity  and reflux disease.  About a month or so ago he started having episodes  of chest pain.  These episodes of chest pain were somewhat unusual.  They would typically come on after he had eaten an hour so before.  He  also noted that he had chest tightness and shoulder and jaw pain during  walking.  He noticed that  over time the episodes of chest discomfort  while walking have gradually worsened.  He was recently on an antacid,  and the pain following meals improved.  The pain associated with  exertion has not improved.   He was sent here for consultation earlier this week.  A stress  Cardiolite study performed yesterday revealed marked defect in the  anterior apical and septal walls.  He is now referred for heart  catheterization.   He denies any syncope or presyncope.  He has had some occasional  palpitations.   CURRENT MEDICATIONS:  1. Aspirin 81 mg a day.  2. HCTZ once a day.  3. Lotensin once a day.  4. Prilosec 20 mg a day.   ALLERGIES:  CECLOR (which causes photosensitivity).   PAST MEDICAL HISTORY.:  1. Hypertension.  2. Rreflux disease.  3. Abnormal Cardiolite study.   SOCIAL HISTORY:  The patient works for [employer].  He has been  trying to walk everyday.  He does not smoke or drink alcohol.   FAMILY HISTORY:  His father is 36 years old and has a  history of  arrhythmias and hypertension.  His mother is 9 years old and has a  history of hypertension.   REVIEW OF SYSTEMS:  Is reviewed and is essentially negative, except as  noted in HPI.   PHYSICIAL EXAMINATION:  He is a middle-aged gentleman in no acute  distress.  He is alert and oriented x3, and his mood and affect are  normal.  His weight is 267, blood pressure is 130/72, with a heart rate  of 76.  HEENT: Exam reveals 2+ carotids.  He has no bruits, no JVD, no  thyromegaly.  LUNGS:  Clear to auscultation.  HEART: Regular rate; S1-S2.  ABDOMINAL EXAM:  Reveals good bowel sounds, nontender.  EXTREMITIES:  He  has no clubbing, cyanosis or edema.  NEUROLOGIC:  Exam is nonfocal.   DIAGNOSTIC TESTING:  His stress Cardiolite study reveals a large defect  in the apical anterior and septal walls.  He had  some idioventricular  rhythm during the stress test, but had it fairly early on in exercise.  His left ventricular systolic function reveals hypokinesis of the  anterior wall and apex.   Nida Boatman presents with a markedly abnormal stress Cardiolite study and  symptoms that are consistent with angina.  I have recommended that we  proceed with heart catheterization.  We have discussed the risks,  benefits and options of heart catheterization.  He understands and  agrees to proceed.  We had him start his aspirin yesterday, and have  given him a prescription for nitroglycerin.  We will have him take  Lipitor 40 mg a day until that time.  We talked about preloading him  with Plavix, but decided not to until we see his anatomy.  All his other  medical problems remain stable.           ______________________________  Vesta Mixer, M.D.     PJN/MEDQ  D:  06/26/2007  T:  06/27/2007  Job:  [id number]   cc:   Vikki Ports, M.D.

## 2011-02-22 NOTE — H&P (Signed)
NAME:  [name], [name]              ACCOUNT NO.:  [id number]   MEDICAL RECORD NO.:  [id number]          PATIENT TYPE:  INP   LOCATION:  1606                         FACILITY:  [facility]   PHYSICIAN:  Melissa L. Ladona Ridgel, MD  DATE OF BIRTH:  November 03, 1960   DATE OF ADMISSION:  02/07/2007  DATE OF DISCHARGE:                              HISTORY & PHYSICAL   CHIEF COMPLAINT:  Fever and night pain.   PRIMARY CARE PHYSICIAN:  Dr. Lanell Persons.   HISTORY OF PRESENT ILLNESS:  The patient is a 50 year old, white male  who developed acute onset of fever, chills and left side pain. During  the night, the left side pain was campy in origin located along the left  lower abdomen. The following morning, the patient noted left leg pain  with redness. He [facility] to the [facility] and was sent to the ER  for further evaluation as he indicated he had been traveling to [place]  and [place]. Dopplers were ordered which were negative. [facility] hospitalists  were asked to admit for further evaluation and cellulitis.   REVIEW OF SYSTEMS:  He has had no sick contacts, he does not have any  pets. He recently traveled to [place] and [place]. He had mild abdominal  cramping with fevers as noted above. Otherwise he has had no weight loss  or gain. He has had no trauma or injury to his lower extremities. All  other review of systems are negative.   SOCIAL HISTORY:  He works as an Acupuncturist. He denies tobacco  or ethanol use.   FAMILY HISTORY:  Mom and dad had CAD. Otherwise their medical illnesses  are unknown.   PAST MEDICAL HISTORY:  Hypertension and sleep apnea on CPAP.   PAST SURGICAL HISTORY:  He had a fistula repaired in the rectal area.   ALLERGIES:  CECLOR which caused a rash. This may have been associated  with sunlight exposure otherwise he has had no problems with penicillin  and amoxin.   MEDICATIONS:  Lotensin and hydrochlorothiazide. His doses are unknown.  His wife will provide [place] with  that information when she returns home.  His pharmacy is [facility].   PHYSICAL EXAMINATION:  VITAL SIGNS:  Temperature was 101.0, blood  pressure 136/76, pulse was 80, respirations 95%.  GENERAL:  He is in no acute distress.  HEENT:  He is normocephalic, atraumatic. Pupils equal round and reactive  to light. Extraocular muscles are intact. He has a mild tongue coating  of white material. TMs are thickened with some serous otitis on the  right.  NECK:  Supple, there is no JVD.  CHEST:  Decreased with no rhonchi, rales or wheezes.  CARDIOVASCULAR:  Regular rate and rhythm. Positive S1, S2, no S3, S4. No  murmurs, rubs or gallops.  ABDOMEN:  Obese, mildly tender left lower quadrant and to the central  abdomen. He has no guarding or rebound. Bowel sounds are also present.  There are some positive lymph nodes in the left groin.  EXTREMITIES:  Show circumferential cellulitis of the left shin and calf.  Some areas of local  fungal infection are noted between toes 3-4 and 4-5  on the left foot and toes 2-3 to 3-4 on the right foot.  NEUROLOGIC:  He is awake, alert, and oriented. Cranial nerves II-XII are  intact with a power of 5/5. DTRs are 2+.   A D-dimer is slightly elevated at 0.53, his white count is 13.5,  hemoglobin of 13.4. Hematocrit 39.4 with platelets of 181. The cultures  are pending x2. Sodium is 133, potassium 3.1, chloride 102, CO2 24, BUN  10, creatinine is 1.0 with glucose of 115.   ASSESSMENT/PLAN:  This is a 50 year old, white male with a past medical  history for sleep apnea and hypertension who presented to the [facility] this morning with left lower quadrant pain and a rash to the  left lower extremity. Dopplers are negative for DVT.   1. Cellulitis of the left leg. The portals for entry is likely a      fungal rash between his toes. Because the patient has had trouble      with cephalosporins, I will consider penicillin derivatives for      treatment of what  likely represents a strep infection. Will      continue the Vancomycin if the erythema spreads. Will consider      adding clindamycin to cover the toxin. Please note that the patient      did have vancomycin in the emergency room and not ceftriaxone and      therefore I will use penicillin G for his coverage. Will need to      followup his blood cultures to assure there is no bacteremia.  2. Hypertension. His wife will need to call in the doses from his home      medications. Because of the hypokalemia, I will go ahead and      replete that with oral potassium x1.  3. Sleep apnea. Respiratory can titrate his CPAP according to      protocol. I would like to add some nasal saline and Nasonex for his      chronic otitis.  4. Endocrine. Will check a hemoglobin A1c.  5. Fungal tinea pedis.  Will provide local antifungal cream to the      feet.      Melissa L. Ladona Ridgel, MD  Electronically Signed     MLT/MEDQ  D:  02/09/2007  T:  02/09/2007  Job:  [id number]   cc:   Vikki Ports, M.D.  Fax: [phone]

## 2011-03-07 ENCOUNTER — Encounter (HOSPITAL_COMMUNITY)
Admission: RE | Admit: 2011-03-07 | Discharge: 2011-03-07 | Disposition: A | Discharge status: Home / Self Care | Payer: BC Managed Care – PPO | Source: Ambulatory Visit | Attending: General Surgery | Admitting: General Surgery

## 2011-03-07 ENCOUNTER — Ambulatory Visit (HOSPITAL_COMMUNITY)
Admission: RE | Admit: 2011-03-07 | Discharge: 2011-03-07 | Disposition: A | Discharge status: Home / Self Care | Payer: BC Managed Care – PPO | Source: Ambulatory Visit | Attending: General Surgery | Admitting: General Surgery

## 2011-03-07 ENCOUNTER — Other Ambulatory Visit (HOSPITAL_COMMUNITY): Payer: BC Managed Care – PPO

## 2011-03-07 ENCOUNTER — Other Ambulatory Visit (HOSPITAL_COMMUNITY): Payer: Self-pay | Admitting: General Surgery

## 2011-03-07 DIAGNOSIS — Z01811 Encounter for preprocedural respiratory examination: Secondary | ICD-10-CM

## 2011-03-07 DIAGNOSIS — Z01818 Encounter for other preprocedural examination: Secondary | ICD-10-CM | POA: Insufficient documentation

## 2011-03-07 DIAGNOSIS — Z01812 Encounter for preprocedural laboratory examination: Secondary | ICD-10-CM | POA: Insufficient documentation

## 2011-03-07 DIAGNOSIS — Z01810 Encounter for preprocedural cardiovascular examination: Secondary | ICD-10-CM | POA: Insufficient documentation

## 2011-03-07 LAB — CBC
HCT: 36.9 % — ABNORMAL LOW (ref 39.0–52.0)
Hemoglobin: 12.7 g/dL — ABNORMAL LOW (ref 13.0–17.0)
MCH: 30.3 pg (ref 26.0–34.0)
MCHC: 34.4 g/dL (ref 30.0–36.0)
MCV: 88.1 fL (ref 78.0–100.0)
Platelets: 173 10*3/uL (ref 150–400)
RBC: 4.19 MIL/uL — ABNORMAL LOW (ref 4.22–5.81)
RDW: 13.1 % (ref 11.5–15.5)
WBC: 5.6 10*3/uL (ref 4.0–10.5)

## 2011-03-07 LAB — DIFFERENTIAL
Basophils Absolute: 0 10*3/uL (ref 0.0–0.1)
Basophils Relative: 1 % (ref 0–1)
Eosinophils Absolute: 0.1 10*3/uL (ref 0.0–0.7)
Eosinophils Relative: 1 % (ref 0–5)
Lymphocytes Relative: 23 % (ref 12–46)
Lymphs Abs: 1.3 10*3/uL (ref 0.7–4.0)
Monocytes Absolute: 0.5 10*3/uL (ref 0.1–1.0)
Monocytes Relative: 9 % (ref 3–12)
Neutro Abs: 3.7 10*3/uL (ref 1.7–7.7)
Neutrophils Relative %: 67 % (ref 43–77)

## 2011-03-07 LAB — BASIC METABOLIC PANEL WITH GFR
BUN: 14 mg/dL (ref 6–23)
CO2: 31 meq/L (ref 19–32)
Calcium: 8.6 mg/dL (ref 8.4–10.5)
Chloride: 104 meq/L (ref 96–112)
Creatinine, Ser: 1.12 mg/dL (ref 0.4–1.5)
GFR calc Af Amer: >60 mL/min (ref >60)
GFR calc non Af Amer: >60 mL/min (ref >60)
Glucose, Bld: 129 mg/dL — ABNORMAL HIGH (ref 70–99)
Potassium: 4.1 meq/L (ref 3.5–5.1)
Sodium: 141 meq/L (ref 135–145)

## 2011-03-07 LAB — SURGICAL PCR SCREEN
MRSA, PCR: NEGATIVE
Staphylococcus aureus: POSITIVE — AB

## 2011-03-12 ENCOUNTER — Ambulatory Visit (HOSPITAL_COMMUNITY)
Admission: RE | Admit: 2011-03-12 | Discharge: 2011-03-12 | Disposition: A | Discharge status: Home / Self Care | Payer: BC Managed Care – PPO | Source: Ambulatory Visit | Attending: General Surgery | Admitting: General Surgery

## 2011-03-12 DIAGNOSIS — K4090 Unilateral inguinal hernia, without obstruction or gangrene, not specified as recurrent: Secondary | ICD-10-CM | POA: Insufficient documentation

## 2011-03-12 DIAGNOSIS — Z951 Presence of aortocoronary bypass graft: Secondary | ICD-10-CM | POA: Insufficient documentation

## 2011-03-12 DIAGNOSIS — G4733 Obstructive sleep apnea (adult) (pediatric): Secondary | ICD-10-CM | POA: Insufficient documentation

## 2011-03-12 DIAGNOSIS — E785 Hyperlipidemia, unspecified: Secondary | ICD-10-CM | POA: Insufficient documentation

## 2011-03-12 DIAGNOSIS — I2510 Atherosclerotic heart disease of native coronary artery without angina pectoris: Secondary | ICD-10-CM | POA: Insufficient documentation

## 2011-03-12 DIAGNOSIS — Z01812 Encounter for preprocedural laboratory examination: Secondary | ICD-10-CM | POA: Insufficient documentation

## 2011-03-12 DIAGNOSIS — Z01810 Encounter for preprocedural cardiovascular examination: Secondary | ICD-10-CM | POA: Insufficient documentation

## 2011-03-12 LAB — PROTIME-INR
INR: 1 (ref 0.00–1.49)
Prothrombin Time: 13.4 seconds (ref 11.6–15.2)

## 2011-03-12 LAB — COMPREHENSIVE METABOLIC PANEL
ALT: 15 U/L (ref 0–53)
AST: 18 U/L (ref 0–37)
Albumin: 3.7 g/dL (ref 3.5–5.2)
Alkaline Phosphatase: 64 U/L (ref 39–117)
BUN: 15 mg/dL (ref 6–23)
CO2: 29 mEq/L (ref 19–32)
Calcium: 9.1 mg/dL (ref 8.4–10.5)
Chloride: 102 mEq/L (ref 96–112)
Creatinine, Ser: 1.01 mg/dL (ref 0.4–1.5)
GFR calc Af Amer: >60 mL/min (ref >60)
GFR calc non Af Amer: >60 mL/min (ref >60)
Glucose, Bld: 90 mg/dL (ref 70–99)
Potassium: 4 mEq/L (ref 3.5–5.1)
Sodium: 139 mEq/L (ref 135–145)
Total Bilirubin: 1 mg/dL (ref 0.3–1.2)
Total Protein: 6 g/dL (ref 6.0–8.3)

## 2011-03-13 NOTE — Op Note (Signed)
[name], [name] NO.:  [id number]  MEDICAL RECORD NO.:  [id number]  LOCATION:  SDSC                         FACILITY:  [facility]  PHYSICIAN:  Adolph Pollack, M.D.DATE OF BIRTH:  Mar 25, 1961  DATE OF PROCEDURE:  03/12/2011 DATE OF DISCHARGE:                              OPERATIVE REPORT   PREOPERATIVE DIAGNOSIS:  Left inguinal hernia.  POSTOPERATIVE DIAGNOSIS:  Left inguinal hernia (indirect).  PROCEDURE:  Left inguinal hernia repair with mesh.  SURGEON:  Adolph Pollack, MD  ANESTHESIA:  General/LMA with local (1.3% bupivacaine lysosomal).  INDICATIONS:  Mr. Cozzens is a 50 year old male who is having increasing pain and some swelling in the left groin since around Christmas time. He has a left inguinal hernia by exam and now presents for repair.  We discussed the procedure risks and aftercare preoperatively.  TECHNIQUE:  He was seen in the holding area and the left groin marked with my initials.  He was then brought to the operating room, placed supine on the operating room table and a general anesthetic was administered by way of LMA.  The hair in the left groin was clipped and left groin was sterilely prepped and draped.  Local anesthetic was infiltrated superficially and deep into the left groin.  I made a left groin incision dividing the skin and subcutaneous tissue down to the level of the external oblique aponeurosis.  Local anesthetic was infiltrated deep to the external oblique aponeurosis.  I then made an incision in the external oblique aponeurosis through the external ring medially and up to the anterior superior iliac spine laterally.  Using blunt dissection, I identified the shelving edge of the inguinal ligament inferiorly and internal oblique aponeurosis superiorly.  I identified the ilioinguinal nerve and retracted anteriorly out of the plane of dissection.  I then isolated the spermatic cord and created a window around  it. Indirect sac was noted and a patulous internal ring were noted consistent with an indirect hernia.  I separated the sac and some extraperitoneal fat from the spermatic cord and reduced it back through the patulous internal ring.  Following this, a piece of 3 x 6 polypropylene mesh was brought into the field.  The spermatic cord was retracted anteriorly.  The mesh was then anchored 2 cm medial to the pubic tubercle with 2-0 Prolene suture.  The inferior aspect of the mesh was anchored to the shelving edge of the inguinal ligament with a running 2-0 Prolene suture up to level 2 cm lateral to the internal ring and then it was tied down.  A slit was cut into the mesh creating 2 tails which were wrapped around the spermatic cord.  The superior aspect of the mesh was anchored to the internal oblique aponeurosis with interrupted 2-0 Vicryl sutures.  The two tails of mesh were crossed creating a new internal ring and these were anchored to the shelving edge of the inguinal ligament with single 2-0 Prolene suture.  The tip of the hemostat could be placed to the new aperture.  Following this a lateral aspect of the mesh was tucked deep to the external oblique aponeurosis.  Local anesthetic was infiltrated into the external oblique aponeurosis.  Hemostasis was adequate.  The external oblique aponeurosis was then closed over the mesh and cord with a running 3-0 Vicryl suture.  Subcutaneous tissue was closed with a running 3-0 Vicryl suture.  Skin was closed with 4-0 Monocryl subcuticular stitch.  Steri-Strips and sterile dressings were applied. Left testicle was in its normal position in his scrotum.  He tolerated the procedure without any apparent complications and was taken to recovery in satisfactory condition.     Adolph Pollack, M.D.     Kari Baars  D:  03/12/2011  T:  03/13/2011  Job:  [id number]  cc:   Kandyce Rud, MD  Electronically Signed by Avel Peace M.D. on  03/13/2011 07:59:03 AM

## 2011-04-08 ENCOUNTER — Telehealth: Payer: Self-pay | Admitting: Cardiovascular Disease

## 2011-04-08 ENCOUNTER — Encounter (INDEPENDENT_AMBULATORY_CARE_PROVIDER_SITE_OTHER): Payer: Self-pay | Admitting: General Surgery

## 2011-04-08 ENCOUNTER — Ambulatory Visit (INDEPENDENT_AMBULATORY_CARE_PROVIDER_SITE_OTHER): Payer: BC Managed Care – PPO | Admitting: General Surgery

## 2011-04-08 DIAGNOSIS — E785 Hyperlipidemia, unspecified: Secondary | ICD-10-CM

## 2011-04-08 DIAGNOSIS — K4090 Unilateral inguinal hernia, without obstruction or gangrene, not specified as recurrent: Secondary | ICD-10-CM

## 2011-04-08 NOTE — Telephone Encounter (Signed)
Returned call to pt regarding labs/ov. Labs ordered and scheduled for 7/6 @ 840am. Pt verbalizes understanding that they are fasting labs. Pt continues to take Lipitor 20mg  daily.

## 2011-04-08 NOTE — Progress Notes (Signed)
He/she presents for postop followup after open left inguinal hernia repair with mesh.  Post op pain is improving.  Did not have pain for 48 hours, then had burning pain in testicle for two weeks.  No difficulty voiding or having BMs.  Swelling is decreasing.  P.E.  GU:  Incision clean/dry/intact, swelling is minimal, repair is solid.  Assessment:  Doing well post hernia repair.  Plan:  Continue light activities for 6 weeks postop then slowly start to resume normal activities.  Avoid strenous abdominal exercises for a total of 8 weeks from surgery.  Avoid activities that cause significant discomfort for the long term.  Return visit as needed.

## 2011-04-08 NOTE — Telephone Encounter (Signed)
Please call patient regarding 6 mo f/u and possible labwork. No orders in chart for fasting labs.Dr.Nahser does not have an appointment opening until end of August. Is it OK to wait?

## 2011-04-12 ENCOUNTER — Other Ambulatory Visit (INDEPENDENT_AMBULATORY_CARE_PROVIDER_SITE_OTHER): Payer: BC Managed Care – PPO | Admitting: *Deleted

## 2011-04-12 ENCOUNTER — Telehealth: Payer: Self-pay | Admitting: *Deleted

## 2011-04-12 DIAGNOSIS — E785 Hyperlipidemia, unspecified: Secondary | ICD-10-CM

## 2011-04-12 LAB — HEPATIC FUNCTION PANEL
ALT: 20 U/L (ref 0–53)
AST: 19 U/L (ref 0–37)
Albumin: 3.9 g/dL (ref 3.5–5.2)
Alkaline Phosphatase: 65 U/L (ref 39–117)
Bilirubin, Direct: 0.2 mg/dL (ref 0.0–0.3)
Total Bilirubin: 1 mg/dL (ref 0.3–1.2)
Total Protein: 6.3 g/dL (ref 6.0–8.3)

## 2011-04-12 LAB — LIPID PANEL
Cholesterol: 117 mg/dL (ref 0–200)
HDL: 66.5 mg/dL (ref >39.00)
LDL Cholesterol: 43 mg/dL (ref 0–99)
Total CHOL/HDL Ratio: 2
Triglycerides: 40 mg/dL (ref 0.0–149.0)
VLDL: 8 mg/dL (ref 0.0–40.0)

## 2011-04-12 NOTE — Telephone Encounter (Signed)
Message copied by Antony Odea on Fri Apr 12, 2011  4:35 PM ------      Message from: Vesta Mixer      Created: Fri Apr 12, 2011  3:46 PM       Nl

## 2011-04-12 NOTE — Telephone Encounter (Signed)
Patient called with lab results. Pt verbalized understanding. Jodette Cristie Mckinney RN  

## 2011-07-18 LAB — CBC
HCT: 31.7 — ABNORMAL LOW
HCT: 31.7 — ABNORMAL LOW
HCT: 33.6 — ABNORMAL LOW
HCT: 34.2 — ABNORMAL LOW
HCT: 36.3 — ABNORMAL LOW
HCT: 36.8 — ABNORMAL LOW
HCT: 38.3 — ABNORMAL LOW
Hemoglobin: 10.7 — ABNORMAL LOW
Hemoglobin: 10.8 — ABNORMAL LOW
Hemoglobin: 11.4 — ABNORMAL LOW
Hemoglobin: 11.6 — ABNORMAL LOW
Hemoglobin: 12.3 — ABNORMAL LOW
Hemoglobin: 12.4 — ABNORMAL LOW
Hemoglobin: 12.9 — ABNORMAL LOW
MCHC: 33.5
MCHC: 33.6
MCHC: 33.8
MCHC: 33.8
MCHC: 33.9
MCHC: 34.1
MCHC: 34.1
MCV: 88.9
MCV: 89.3
MCV: 89.4
MCV: 89.9
MCV: 90
MCV: 90.2
MCV: 90.7
Platelets: 193
Platelets: 199
Platelets: 211
Platelets: 212
Platelets: 214
Platelets: 244
Platelets: 331
RBC: 3.52 — ABNORMAL LOW
RBC: 3.55 — ABNORMAL LOW
RBC: 3.77 — ABNORMAL LOW
RBC: 3.78 — ABNORMAL LOW
RBC: 4.06 — ABNORMAL LOW
RBC: 4.09 — ABNORMAL LOW
RBC: 4.26
RDW: 12.6
RDW: 12.9
RDW: 12.9
RDW: 12.9
RDW: 13
RDW: 13.4
RDW: 13.5
WBC: 12 — ABNORMAL HIGH
WBC: 12.1 — ABNORMAL HIGH
WBC: 12.2 — ABNORMAL HIGH
WBC: 17.7 — ABNORMAL HIGH
WBC: 8.7
WBC: 8.7
WBC: 8.8

## 2011-07-18 LAB — POCT I-STAT 3, ART BLOOD GAS (G3+)
Acid-base deficit: 1
Acid-base deficit: 2
Bicarbonate: 23
Bicarbonate: 23.1
Bicarbonate: 24.7 — ABNORMAL HIGH
O2 Saturation: 99
O2 Saturation: 99
O2 Saturation: 99
Operator id: 252761
Operator id: 252761
Operator id: 274841
Patient temperature: 35.7
Patient temperature: 36.9
Patient temperature: 37.2
TCO2: 24
TCO2: 24
TCO2: 26
pCO2 arterial: 36.6
pCO2 arterial: 37
pCO2 arterial: 38.9
pH, Arterial: 7.378
pH, Arterial: 7.405
pH, Arterial: 7.432
pO2, Arterial: 125 — ABNORMAL HIGH
pO2, Arterial: 130 — ABNORMAL HIGH
pO2, Arterial: 146 — ABNORMAL HIGH

## 2011-07-18 LAB — POCT I-STAT 4, (NA,K, GLUC, HGB,HCT)
Glucose, Bld: 100 — ABNORMAL HIGH
Glucose, Bld: 102 — ABNORMAL HIGH
Glucose, Bld: 107 — ABNORMAL HIGH
Glucose, Bld: 113 — ABNORMAL HIGH
HCT: 32 — ABNORMAL LOW
HCT: 33 — ABNORMAL LOW
HCT: 33 — ABNORMAL LOW
HCT: 36 — ABNORMAL LOW
Hemoglobin: 10.9 — ABNORMAL LOW
Hemoglobin: 11.2 — ABNORMAL LOW
Hemoglobin: 11.2 — ABNORMAL LOW
Hemoglobin: 12.2 — ABNORMAL LOW
Operator id: 274841
Operator id: 3406
Operator id: 3406
Operator id: 3406
Potassium: 3.8
Potassium: 4.2
Potassium: 4.3
Potassium: 4.6
Sodium: 136
Sodium: 137
Sodium: 137
Sodium: 137

## 2011-07-18 LAB — I-STAT EC8
Acid-base deficit: 4 — ABNORMAL HIGH
BUN: 7
Bicarbonate: 20.6
Chloride: 105
Glucose, Bld: 116 — ABNORMAL HIGH
HCT: 38 — ABNORMAL LOW
Hemoglobin: 12.9 — ABNORMAL LOW
Operator id: 252761
Potassium: 4.2
Sodium: 138
TCO2: 22
pCO2 arterial: 37.3
pH, Arterial: 7.352

## 2011-07-18 LAB — BLOOD GAS, ARTERIAL
Acid-Base Excess: 1.7
Bicarbonate: 25.2 — ABNORMAL HIGH
O2 Saturation: 99.2
Patient temperature: 98.6
TCO2: 26.3
pCO2 arterial: 36.2
pH, Arterial: 7.457 — ABNORMAL HIGH
pO2, Arterial: 117 — ABNORMAL HIGH

## 2011-07-18 LAB — BASIC METABOLIC PANEL
BUN: 13
BUN: 14
BUN: 8
BUN: 9
CO2: 22
CO2: 26
CO2: 28
CO2: 30
Calcium: 6.7 — ABNORMAL LOW
Calcium: 8 — ABNORMAL LOW
Calcium: 8.5
Calcium: 8.5
Chloride: 105
Chloride: 106
Chloride: 106
Chloride: 109
Creatinine, Ser: 0.83
Creatinine, Ser: 1.07
Creatinine, Ser: 1.17
Creatinine, Ser: 1.17
GFR calc Af Amer: >60
GFR calc Af Amer: >60
GFR calc Af Amer: >60
GFR calc Af Amer: >60
GFR calc non Af Amer: >60
GFR calc non Af Amer: >60
GFR calc non Af Amer: >60
GFR calc non Af Amer: >60
Glucose, Bld: 104 — ABNORMAL HIGH
Glucose, Bld: 117 — ABNORMAL HIGH
Glucose, Bld: 96
Glucose, Bld: 98
Potassium: 3.7
Potassium: 3.8
Potassium: 3.9
Potassium: 4.5
Sodium: 134 — ABNORMAL LOW
Sodium: 136
Sodium: 138
Sodium: 139

## 2011-07-18 LAB — URINALYSIS, ROUTINE W REFLEX MICROSCOPIC
Bilirubin Urine: NEGATIVE
Glucose, UA: NEGATIVE
Ketones, ur: NEGATIVE
Leukocytes, UA: NEGATIVE
Nitrite: NEGATIVE
Protein, ur: NEGATIVE
Specific Gravity, Urine: 1.015
Urobilinogen, UA: 0.2
pH: 6

## 2011-07-18 LAB — CARDIAC PANEL(CRET KIN+CKTOT+MB+TROPI)
CK, MB: 0.9
CK, MB: 1.1
CK, MB: 1.1
Relative Index: INVALID
Relative Index: INVALID
Relative Index: INVALID
Total CK: 46
Total CK: 53
Total CK: 61
Troponin I: 0.04
Troponin I: 0.04
Troponin I: 0.06

## 2011-07-18 LAB — CREATININE, SERUM
Creatinine, Ser: 1.08
GFR calc Af Amer: >60
GFR calc non Af Amer: >60

## 2011-07-18 LAB — HEMOGLOBIN A1C
Hgb A1c MFr Bld: 5.7
Mean Plasma Glucose: 126

## 2011-07-18 LAB — MAGNESIUM
Magnesium: 2.2
Magnesium: 2.8 — ABNORMAL HIGH

## 2011-07-18 LAB — LIPID PANEL
Cholesterol: 109
HDL: 30 — ABNORMAL LOW
LDL Cholesterol: 61
Total CHOL/HDL Ratio: 3.6
Triglycerides: 88
VLDL: 18

## 2011-07-18 LAB — COMPREHENSIVE METABOLIC PANEL
ALT: 25
AST: 16
Albumin: 3 — ABNORMAL LOW
Alkaline Phosphatase: 61
BUN: 8
CO2: 30
Calcium: 8.5
Chloride: 103
Creatinine, Ser: 1.28
GFR calc Af Amer: >60
GFR calc non Af Amer: >60
Glucose, Bld: 96
Potassium: 3.5
Sodium: 139
Total Bilirubin: 0.8
Total Protein: 5.6 — ABNORMAL LOW

## 2011-07-18 LAB — PROTIME-INR
INR: 1
INR: 1.1
Prothrombin Time: 13
Prothrombin Time: 14.7

## 2011-07-18 LAB — CK TOTAL AND CKMB (NOT AT ARMC)
CK, MB: 9.9 — ABNORMAL HIGH
Relative Index: 4.1 — ABNORMAL HIGH
Total CK: 242 — ABNORMAL HIGH

## 2011-07-18 LAB — APTT
aPTT: 28
aPTT: 29

## 2011-07-18 LAB — BLEEDING TIME: Bleeding Time: 9 — ABNORMAL HIGH

## 2011-07-18 LAB — POCT I-STAT GLUCOSE
Glucose, Bld: 97
Operator id: 3406

## 2011-07-18 LAB — ABO/RH: ABO/RH(D): A POS

## 2011-07-18 LAB — URINE MICROSCOPIC-ADD ON

## 2011-07-18 LAB — TYPE AND SCREEN
ABO/RH(D): A POS
Antibody Screen: NEGATIVE

## 2011-09-26 ENCOUNTER — Telehealth: Payer: Self-pay | Admitting: Cardiovascular Disease

## 2011-09-26 DIAGNOSIS — E785 Hyperlipidemia, unspecified: Secondary | ICD-10-CM

## 2011-09-26 NOTE — Telephone Encounter (Signed)
Pt calling to see when next appt due?

## 2011-09-26 NOTE — Telephone Encounter (Signed)
App set for ov/labs

## 2011-11-11 ENCOUNTER — Ambulatory Visit (INDEPENDENT_AMBULATORY_CARE_PROVIDER_SITE_OTHER): Payer: BC Managed Care – PPO | Admitting: Cardiovascular Disease

## 2011-11-11 ENCOUNTER — Encounter: Payer: Self-pay | Admitting: Cardiovascular Disease

## 2011-11-11 ENCOUNTER — Other Ambulatory Visit (INDEPENDENT_AMBULATORY_CARE_PROVIDER_SITE_OTHER): Payer: BC Managed Care – PPO | Admitting: *Deleted

## 2011-11-11 VITALS — BP 152/89 | HR 55 | Ht 69.0 in | Wt 164.8 lb

## 2011-11-11 DIAGNOSIS — I2510 Atherosclerotic heart disease of native coronary artery without angina pectoris: Secondary | ICD-10-CM

## 2011-11-11 DIAGNOSIS — E785 Hyperlipidemia, unspecified: Secondary | ICD-10-CM

## 2011-11-11 LAB — LIPID PANEL
Cholesterol: 123 mg/dL (ref 0–200)
HDL: 68 mg/dL (ref >39.00)
LDL Cholesterol: 48 mg/dL (ref 0–99)
Total CHOL/HDL Ratio: 2
Triglycerides: 34 mg/dL (ref 0.0–149.0)
VLDL: 6.8 mg/dL (ref 0.0–40.0)

## 2011-11-11 LAB — HEPATIC FUNCTION PANEL
ALT: 18 U/L (ref 0–53)
AST: 21 U/L (ref 0–37)
Albumin: 4 g/dL (ref 3.5–5.2)
Alkaline Phosphatase: 67 U/L (ref 39–117)
Bilirubin, Direct: 0.2 mg/dL (ref 0.0–0.3)
Total Bilirubin: 0.9 mg/dL (ref 0.3–1.2)
Total Protein: 6.7 g/dL (ref 6.0–8.3)

## 2011-11-11 LAB — BASIC METABOLIC PANEL
BUN: 20 mg/dL (ref 6–23)
CO2: 28 mEq/L (ref 19–32)
Calcium: 9.1 mg/dL (ref 8.4–10.5)
Chloride: 105 mEq/L (ref 96–112)
Creatinine, Ser: 1.1 mg/dL (ref 0.4–1.5)
GFR: 73.12 mL/min (ref >60.00)
Glucose, Bld: 88 mg/dL (ref 70–99)
Potassium: 3.9 mEq/L (ref 3.5–5.1)
Sodium: 140 mEq/L (ref 135–145)

## 2011-11-11 NOTE — Assessment & Plan Note (Signed)
[name] is doing well. We'll continue with the same medications. Quite pleased that he is not having any symptoms.

## 2011-11-11 NOTE — Assessment & Plan Note (Signed)
We will continue with his current dose of Lipitor. We discussed some of the side effects of Lipitor that are in the news these days. Include hyperglycemia and loss of memory. So far, he is not having any of these symptoms. I told him that I thought the risks of not taking Lipitor exceeded the potential side effects. He seems to be doing well. I'll see him again in one year for followup visit, EKG, and fasting labs.

## 2011-11-11 NOTE — Patient Instructions (Addendum)
Your physician wants you to follow-up in: 1 year  You will receive a reminder letter in the mail two months in advance. If you don't receive a letter, please call our office to schedule the follow-up appointment.  Your physician recommends that you return for lab work in: 1 year/ fasting labs

## 2011-11-11 NOTE — Progress Notes (Signed)
    [name] Date of Birth  Nov 27, 1960 [facility]     [place] [facility]  [address]    [address]   [address] [place], [place]  [address]    [place], [place]  [address] [phone]  Fax  [phone]  [phone]  Fax [phone]  Problem list: 1.  Coronary artery disease-status post CABG - Sept. 2008 2.  Morbid obesity-status post 140 pound weight loss 3. Hyperlipidemia 4. Hypertension  History of Present Illness:  51 year old gentleman with a history of coronary artery disease. He has a history of obesity but has lost quite a bit of weight.  He's done well since I last saw him. He's not had any episodes of chest pain or shortness of breath.  He is exercising regularly.    Current Outpatient Prescriptions on File Prior to Visit  Medication Sig Dispense Refill  . aspirin 325 MG tablet Take 325 mg by mouth daily.        Marland Kitchen atorvastatin (LIPITOR) 20 MG tablet Take 20 mg by mouth daily.          Allergies  Allergen Reactions  . Cefaclor     REACTION: rash    Past Medical History  Diagnosis Date  . Left inguinal hernia   . Right inguinal hernia   . Hypertension   . Hyperlipidemia   . Asthma   . Allergy     Past Surgical History  Procedure Date  . Double coronary artery bypass   . Hernia repair     left and right inguinal    History  Smoking status  . Never Smoker   Smokeless tobacco  . Not on file    History  Alcohol Use No    Family History  Problem Relation Age of Onset  . Hypertension Mother   . Hyperlipidemia Mother   . Hypertension Father   . Hyperlipidemia Father     Reviw of Systems:  Reviewed in the HPI.  All other systems are negative.  Physical Exam: Blood pressure 152/89, pulse 55, height 5\' 9"  (1.753 m), weight 164 lb 12.8 oz (74.753 kg). General: Well developed, well nourished, in no acute distress.  Head: Normocephalic, atraumatic, sclera non-icteric, mucus membranes are moist,   Neck: Supple.  Negative for carotid bruits. JVD not elevated.  Lungs: Clear bilaterally to auscultation without wheezes, rales, or rhonchi. Breathing is unlabored.  Heart: RRR with S1 S2. No murmurs, rubs, or gallops appreciated.  Abdomen: Soft, non-tender, non-distended with normoactive bowel sounds. No hepatomegaly. No rebound/guarding. No obvious abdominal masses.  Msk:  Strength and tone appear normal for age.  Extremities: No clubbing or cyanosis. No edema.  Distal pedal pulses are 2+ and equal bilaterally.  Neuro: Alert and oriented X 3. Moves all extremities spontaneously.  Psych:  Responds to questions appropriately with a normal affect.  ECG: Sinus brady,  no ST or T wave changes. Assessment / Plan:

## 2012-05-14 ENCOUNTER — Other Ambulatory Visit: Payer: Self-pay | Admitting: *Deleted

## 2012-05-14 MED ORDER — ATORVASTATIN CALCIUM 20 MG PO TABS: 20.0000 mg | ORAL_TABLET | Freq: Every day | ORAL | Status: DC

## 2012-05-14 NOTE — Telephone Encounter (Signed)
Fax Received. Refill Completed. Goku Harb Chowoe (R.M.A)   

## 2012-09-05 ENCOUNTER — Inpatient Hospital Stay (HOSPITAL_COMMUNITY)
Admission: EM | Admit: 2012-09-05 | Discharge: 2012-09-11 | DRG: 732 | Disposition: A | Discharge status: Rehabilitation Facility | Payer: BC Managed Care – PPO | Attending: General Surgery | Admitting: General Surgery

## 2012-09-05 ENCOUNTER — Emergency Department (HOSPITAL_COMMUNITY): Payer: BC Managed Care – PPO

## 2012-09-05 ENCOUNTER — Encounter (HOSPITAL_COMMUNITY): Payer: Self-pay | Admitting: Emergency Medicine

## 2012-09-05 DIAGNOSIS — G4733 Obstructive sleep apnea (adult) (pediatric): Secondary | ICD-10-CM | POA: Diagnosis present

## 2012-09-05 DIAGNOSIS — S32401A Unspecified fracture of right acetabulum, initial encounter for closed fracture: Secondary | ICD-10-CM | POA: Diagnosis present

## 2012-09-05 DIAGNOSIS — S32409A Unspecified fracture of unspecified acetabulum, initial encounter for closed fracture: Secondary | ICD-10-CM

## 2012-09-05 DIAGNOSIS — S32009A Unspecified fracture of unspecified lumbar vertebra, initial encounter for closed fracture: Secondary | ICD-10-CM

## 2012-09-05 DIAGNOSIS — S270XXA Traumatic pneumothorax, initial encounter: Secondary | ICD-10-CM

## 2012-09-05 DIAGNOSIS — Z7982 Long term (current) use of aspirin: Secondary | ICD-10-CM

## 2012-09-05 DIAGNOSIS — J939 Pneumothorax, unspecified: Secondary | ICD-10-CM | POA: Diagnosis present

## 2012-09-05 DIAGNOSIS — Z951 Presence of aortocoronary bypass graft: Secondary | ICD-10-CM

## 2012-09-05 DIAGNOSIS — E785 Hyperlipidemia, unspecified: Secondary | ICD-10-CM | POA: Diagnosis present

## 2012-09-05 DIAGNOSIS — D62 Acute posthemorrhagic anemia: Secondary | ICD-10-CM | POA: Diagnosis not present

## 2012-09-05 DIAGNOSIS — Z79899 Other long term (current) drug therapy: Secondary | ICD-10-CM

## 2012-09-05 DIAGNOSIS — S73004A Unspecified dislocation of right hip, initial encounter: Secondary | ICD-10-CM | POA: Diagnosis present

## 2012-09-05 DIAGNOSIS — J982 Interstitial emphysema: Secondary | ICD-10-CM

## 2012-09-05 DIAGNOSIS — T797XXA Traumatic subcutaneous emphysema, initial encounter: Secondary | ICD-10-CM | POA: Diagnosis present

## 2012-09-05 DIAGNOSIS — J45909 Unspecified asthma, uncomplicated: Secondary | ICD-10-CM | POA: Diagnosis present

## 2012-09-05 DIAGNOSIS — W19XXXA Unspecified fall, initial encounter: Secondary | ICD-10-CM | POA: Diagnosis present

## 2012-09-05 DIAGNOSIS — I10 Essential (primary) hypertension: Secondary | ICD-10-CM | POA: Diagnosis present

## 2012-09-05 DIAGNOSIS — I2510 Atherosclerotic heart disease of native coronary artery without angina pectoris: Secondary | ICD-10-CM | POA: Diagnosis present

## 2012-09-05 DIAGNOSIS — W138XXA Fall from, out of or through other building or structure, initial encounter: Secondary | ICD-10-CM | POA: Diagnosis present

## 2012-09-05 LAB — URINALYSIS, ROUTINE W REFLEX MICROSCOPIC
Bilirubin Urine: NEGATIVE
Glucose, UA: NEGATIVE mg/dL
Ketones, ur: 15 mg/dL — AB
Leukocytes, UA: NEGATIVE
Nitrite: NEGATIVE
Protein, ur: NEGATIVE mg/dL
Specific Gravity, Urine: 1.033 — ABNORMAL HIGH (ref 1.005–1.030)
Urobilinogen, UA: 0.2 mg/dL (ref 0.0–1.0)
pH: 5.5 (ref 5.0–8.0)

## 2012-09-05 LAB — COMPREHENSIVE METABOLIC PANEL
ALT: 24 U/L (ref 0–53)
AST: 34 U/L (ref 0–37)
Albumin: 3.5 g/dL (ref 3.5–5.2)
Alkaline Phosphatase: 63 U/L (ref 39–117)
BUN: 22 mg/dL (ref 6–23)
CO2: 22 mEq/L (ref 19–32)
Calcium: 8.5 mg/dL (ref 8.4–10.5)
Chloride: 102 mEq/L (ref 96–112)
Creatinine, Ser: 0.89 mg/dL (ref 0.50–1.35)
GFR calc Af Amer: >90 mL/min (ref >90)
GFR calc non Af Amer: >90 mL/min (ref >90)
Glucose, Bld: 116 mg/dL — ABNORMAL HIGH (ref 70–99)
Potassium: 3.3 mEq/L — ABNORMAL LOW (ref 3.5–5.1)
Sodium: 137 mEq/L (ref 135–145)
Total Bilirubin: 0.6 mg/dL (ref 0.3–1.2)
Total Protein: 5.9 g/dL — ABNORMAL LOW (ref 6.0–8.3)

## 2012-09-05 LAB — CBC WITH DIFFERENTIAL/PLATELET
Basophils Absolute: 0 10*3/uL (ref 0.0–0.1)
Basophils Relative: 0 % (ref 0–1)
Eosinophils Absolute: 0 10*3/uL (ref 0.0–0.7)
Eosinophils Relative: 0 % (ref 0–5)
HCT: 36.6 % — ABNORMAL LOW (ref 39.0–52.0)
Hemoglobin: 12.7 g/dL — ABNORMAL LOW (ref 13.0–17.0)
Lymphocytes Relative: 5 % — ABNORMAL LOW (ref 12–46)
Lymphs Abs: 1.2 10*3/uL (ref 0.7–4.0)
MCH: 30.4 pg (ref 26.0–34.0)
MCHC: 34.7 g/dL (ref 30.0–36.0)
MCV: 87.6 fL (ref 78.0–100.0)
Monocytes Absolute: 1.6 10*3/uL — ABNORMAL HIGH (ref 0.1–1.0)
Monocytes Relative: 7 % (ref 3–12)
Neutro Abs: 20.4 10*3/uL — ABNORMAL HIGH (ref 1.7–7.7)
Neutrophils Relative %: 88 % — ABNORMAL HIGH (ref 43–77)
Platelets: 198 10*3/uL (ref 150–400)
RBC: 4.18 MIL/uL — ABNORMAL LOW (ref 4.22–5.81)
RDW: 12.7 % (ref 11.5–15.5)
WBC: 23.2 10*3/uL — ABNORMAL HIGH (ref 4.0–10.5)

## 2012-09-05 LAB — CG4 I-STAT (LACTIC ACID): Lactic Acid, Venous: 2.71 mmol/L — ABNORMAL HIGH (ref 0.5–2.2)

## 2012-09-05 LAB — URINE MICROSCOPIC-ADD ON

## 2012-09-05 LAB — MRSA PCR SCREENING: MRSA by PCR: NEGATIVE

## 2012-09-05 LAB — LIPASE, BLOOD: Lipase: 34 U/L (ref 11–59)

## 2012-09-05 MED ORDER — OXYCODONE HCL 5 MG PO TABS: 5.0000 mg | ORAL_TABLET | ORAL | Status: DC | PRN

## 2012-09-05 MED ORDER — DOCUSATE SODIUM 100 MG PO CAPS
100.0000 mg | ORAL_CAPSULE | Freq: Two times a day (BID) | ORAL | Status: DC
  Administered 2012-09-06 – 2012-09-08 (×4): 100 mg via ORAL
  Filled 2012-09-05 (×8): qty 1

## 2012-09-05 MED ORDER — ONDANSETRON HCL 4 MG PO TABS
4.0000 mg | ORAL_TABLET | Freq: Four times a day (QID) | ORAL | Status: DC | PRN
  Administered 2012-09-10 (×2): 4 mg via ORAL
  Filled 2012-09-05 (×2): qty 1

## 2012-09-05 MED ORDER — PROPOFOL 10 MG/ML IV BOLUS
0.5000 mg/kg | Freq: Once | INTRAVENOUS | Status: AC
  Administered 2012-09-05: 37.4 mg via INTRAVENOUS
  Filled 2012-09-05: qty 20

## 2012-09-05 MED ORDER — PROPOFOL 10 MG/ML IV BOLUS: 0.5000 mg/kg | Freq: Once | INTRAVENOUS | Status: DC

## 2012-09-05 MED ORDER — MIDAZOLAM HCL 2 MG/2ML IJ SOLN
INTRAMUSCULAR | Status: AC
  Filled 2012-09-05: qty 2

## 2012-09-05 MED ORDER — NALOXONE HCL 0.4 MG/ML IJ SOLN: 0.4000 mg | INTRAMUSCULAR | Status: DC | PRN

## 2012-09-05 MED ORDER — DIPHENHYDRAMINE HCL 50 MG/ML IJ SOLN: 12.5000 mg | Freq: Four times a day (QID) | INTRAMUSCULAR | Status: DC | PRN

## 2012-09-05 MED ORDER — HYDROMORPHONE 0.3 MG/ML IV SOLN
INTRAVENOUS | Status: DC
  Administered 2012-09-07: 02:00:00 via INTRAVENOUS
  Administered 2012-09-07: 0.4 mg via INTRAVENOUS
  Administered 2012-09-07: 0.99 mg via INTRAVENOUS
  Administered 2012-09-07: 0.399 mg via INTRAVENOUS
  Administered 2012-09-07: 0.2 mg via INTRAVENOUS
  Administered 2012-09-08: 0.4 mg via INTRAVENOUS
  Administered 2012-09-08: 0.6 mg via INTRAVENOUS
  Administered 2012-09-08: 14:00:00 via INTRAVENOUS
  Administered 2012-09-08: 0.8 mg via INTRAVENOUS
  Administered 2012-09-09: 1.19 mg via INTRAVENOUS
  Administered 2012-09-09: 0.399 mg via INTRAVENOUS
  Administered 2012-09-09: 0.999 mg via INTRAVENOUS
  Administered 2012-09-09: 13:00:00 via INTRAVENOUS
  Filled 2012-09-05 (×2): qty 25

## 2012-09-05 MED ORDER — DIPHENHYDRAMINE HCL 12.5 MG/5ML PO ELIX
12.5000 mg | ORAL_SOLUTION | Freq: Four times a day (QID) | ORAL | Status: DC | PRN
  Filled 2012-09-05: qty 5

## 2012-09-05 MED ORDER — HYDROMORPHONE HCL PF 1 MG/ML IJ SOLN
1.0000 mg | Freq: Once | INTRAMUSCULAR | Status: AC
  Administered 2012-09-05: 1 mg via INTRAVENOUS
  Filled 2012-09-05: qty 1

## 2012-09-05 MED ORDER — ONDANSETRON HCL 4 MG/2ML IJ SOLN
4.0000 mg | Freq: Once | INTRAMUSCULAR | Status: AC
  Administered 2012-09-05: 4 mg via INTRAVENOUS

## 2012-09-05 MED ORDER — ONDANSETRON HCL 4 MG/2ML IJ SOLN: 4.0000 mg | Freq: Four times a day (QID) | INTRAMUSCULAR | Status: DC | PRN

## 2012-09-05 MED ORDER — PANTOPRAZOLE SODIUM 40 MG PO TBEC
40.0000 mg | DELAYED_RELEASE_TABLET | Freq: Every day | ORAL | Status: DC
  Administered 2012-09-05: 40 mg via ORAL
  Filled 2012-09-05: qty 1

## 2012-09-05 MED ORDER — MIDAZOLAM HCL 2 MG/2ML IJ SOLN
4.0000 mg | Freq: Once | INTRAMUSCULAR | Status: AC
Start: 2012-09-05 — End: 2012-09-05
  Administered 2012-09-05: 4 mg via INTRAVENOUS

## 2012-09-05 MED ORDER — ATORVASTATIN CALCIUM 20 MG PO TABS
20.0000 mg | ORAL_TABLET | Freq: Every day | ORAL | Status: DC
  Administered 2012-09-05 – 2012-09-10 (×6): 20 mg via ORAL
  Filled 2012-09-05 (×7): qty 1

## 2012-09-05 MED ORDER — TRAMADOL HCL 50 MG PO TABS
50.0000 mg | ORAL_TABLET | Freq: Four times a day (QID) | ORAL | Status: DC | PRN
  Administered 2012-09-05 – 2012-09-06 (×5): 50 mg via ORAL
  Filled 2012-09-05 (×6): qty 1

## 2012-09-05 MED ORDER — SODIUM CHLORIDE 0.9 % IJ SOLN: 9.0000 mL | INTRAMUSCULAR | Status: DC | PRN

## 2012-09-05 MED ORDER — PANTOPRAZOLE SODIUM 40 MG IV SOLR
40.0000 mg | Freq: Every day | INTRAVENOUS | Status: DC
  Administered 2012-09-06: 40 mg via INTRAVENOUS
  Filled 2012-09-05 (×4): qty 40

## 2012-09-05 MED ORDER — KCL IN DEXTROSE-NACL 20-5-0.45 MEQ/L-%-% IV SOLN
INTRAVENOUS | Status: DC
  Administered 2012-09-05 – 2012-09-08 (×4): via INTRAVENOUS
  Filled 2012-09-05 (×9): qty 1000

## 2012-09-05 MED ORDER — OXYCODONE HCL 5 MG PO TABS
10.0000 mg | ORAL_TABLET | ORAL | Status: DC | PRN
  Filled 2012-09-05: qty 2

## 2012-09-05 MED ORDER — ACETAMINOPHEN 325 MG PO TABS: 650.0000 mg | ORAL_TABLET | ORAL | Status: DC | PRN

## 2012-09-05 MED ORDER — ONDANSETRON HCL 4 MG/2ML IJ SOLN
INTRAMUSCULAR | Status: AC
  Administered 2012-09-05: 4 mg via INTRAVENOUS
  Filled 2012-09-05: qty 2

## 2012-09-05 MED ORDER — IOHEXOL 300 MG/ML  SOLN
100.0000 mL | Freq: Once | INTRAMUSCULAR | Status: AC | PRN
  Administered 2012-09-05: 100 mL via INTRAVENOUS

## 2012-09-05 MED ORDER — SODIUM CHLORIDE 0.9 % IV SOLN
Freq: Once | INTRAVENOUS | Status: AC
  Administered 2012-09-05: 15:00:00 via INTRAVENOUS

## 2012-09-05 MED ORDER — SODIUM CHLORIDE 0.9 % IV BOLUS (SEPSIS)
1000.0000 mL | Freq: Once | INTRAVENOUS | Status: AC
  Administered 2012-09-05: 1000 mL via INTRAVENOUS

## 2012-09-05 MED ORDER — ONDANSETRON HCL 4 MG/2ML IJ SOLN
4.0000 mg | Freq: Four times a day (QID) | INTRAMUSCULAR | Status: DC | PRN
  Administered 2012-09-07: 4 mg via INTRAVENOUS
  Filled 2012-09-05: qty 2

## 2012-09-05 NOTE — ED Notes (Signed)
Pt fell from roof, landing on wood deck. Pt. Denies LOC. Complaining of LUQ,Left Hip Lower back and right leg pain. EMS states he as SOB on seen. EMS gave 200 mcg Fentanyl .

## 2012-09-05 NOTE — Consult Note (Signed)
Reason for Consult:right hip fracture/ dislocation Referring Physician: Janee Morn, trauma MD  [name] is an 51 y.o. male.  HPI: 51 yo male s/p fall 9 feet while cleaning gutters.  C/o immediate right hip pain. Unable to stand after the fall.  Patient transported to [facility] ED for eval and treatment.  Past Medical History  Diagnosis Date  . Left inguinal hernia   . Right inguinal hernia   . Hypertension   . Hyperlipidemia   . Asthma   . Allergy   . Postsurgical aortocoronary bypass status   . CAD (coronary artery disease)     Past Surgical History  Procedure Date  . Double coronary artery bypass   . Hernia repair     left and right inguinal    Family History  Problem Relation Age of Onset  . Hypertension Mother   . Hyperlipidemia Mother   . Hypertension Father   . Hyperlipidemia Father     Social History:  reports that he has never smoked. He does not have any smokeless tobacco history on file. He reports that he does not drink alcohol or use illicit drugs.  Allergies:  Allergies  Allergen Reactions  . Cefaclor     REACTION: rash    Medications: baby aspirin  Results for orders placed during the hospital encounter of 09/05/12 (from the past 48 hour(s))  CBC WITH DIFFERENTIAL     Status: Abnormal   Collection Time   09/05/12  1:50 PM      Component Value Range Comment   WBC 23.2 (*) 4.0 - 10.5 K/uL    RBC 4.18 (*) 4.22 - 5.81 MIL/uL    Hemoglobin 12.7 (*) 13.0 - 17.0 g/dL    HCT 16.1 (*) 09.6 - 52.0 %    MCV 87.6  78.0 - 100.0 fL    MCH 30.4  26.0 - 34.0 pg    MCHC 34.7  30.0 - 36.0 g/dL    RDW 04.5  40.9 - 81.1 %    Platelets 198  150 - 400 K/uL    Neutrophils Relative 88 (*) 43 - 77 %    Lymphocytes Relative 5 (*) 12 - 46 %    Monocytes Relative 7  3 - 12 %    Eosinophils Relative 0  0 - 5 %    Basophils Relative 0  0 - 1 %    Neutro Abs 20.4 (*) 1.7 - 7.7 K/uL    Lymphs Abs 1.2  0.7 - 4.0 K/uL    Monocytes Absolute 1.6 (*) 0.1 - 1.0 K/uL    Eosinophils Absolute 0.0  0.0 - 0.7 K/uL    Basophils Absolute 0.0  0.0 - 0.1 K/uL    Smear Review MORPHOLOGY UNREMARKABLE     COMPREHENSIVE METABOLIC PANEL     Status: Abnormal   Collection Time   09/05/12  1:50 PM      Component Value Range Comment   Sodium 137  135 - 145 mEq/L    Potassium 3.3 (*) 3.5 - 5.1 mEq/L    Chloride 102  96 - 112 mEq/L    CO2 22  19 - 32 mEq/L    Glucose, Bld 116 (*) 70 - 99 mg/dL    BUN 22  6 - 23 mg/dL    Creatinine, Ser 9.14  0.50 - 1.35 mg/dL    Calcium 8.5  8.4 - 78.2 mg/dL    Total Protein 5.9 (*) 6.0 - 8.3 g/dL    Albumin 3.5  3.5 -  5.2 g/dL    AST 34  0 - 37 U/L    ALT 24  0 - 53 U/L    Alkaline Phosphatase 63  39 - 117 U/L    Total Bilirubin 0.6  0.3 - 1.2 mg/dL    GFR calc non Af Amer >90  >90 mL/min    GFR calc Af Amer >90  >90 mL/min   LIPASE, BLOOD     Status: Normal   Collection Time   09/05/12  1:50 PM      Component Value Range Comment   Lipase 34  11 - 59 U/L   CG4 I-STAT (LACTIC ACID)     Status: Abnormal   Collection Time   09/05/12  2:05 PM      Component Value Range Comment   Lactic Acid, Venous 2.71 (*) 0.5 - 2.2 mmol/L     Ct Head Wo Contrast  09/05/2012  *RADIOLOGY REPORT*  Clinical Data:  Larey Seat from a roof approximately 8 feet onto a wooden deck.  CT HEAD WITHOUT CONTRAST CT CERVICAL SPINE WITHOUT CONTRAST  Technique:  Multidetector CT imaging of the head and cervical spine was performed following the standard protocol without intravenous contrast.  Multiplanar CT image reconstructions of the cervical spine were also generated.  Comparison:  None.  CT HEAD  Findings: Ventricular system normal in size and appearance for age. No mass lesion.  No midline shift.  No acute hemorrhage or hematoma.  No extra-axial fluid collections.  No evidence of acute infarction.  No focal brain parenchymal abnormalities.  No skull fractures or other focal osseous abnormalities involving the skull.  Visualized paranasal sinuses, mastoid air cells,  and middle ear cavities well-aerated.  Note is made of bony nasal septal deviation to the left.  IMPRESSION: Normal examination.  CT CERVICAL SPINE  Findings: No fractures identified involving the cervical spine. Sagittal reconstructed images demonstrate anatomic alignment. There is partial fusion of the posterior elements at C2-3 on the right which is likely congenital.  Facet joints intact throughout. No spinal stenosis.  Well-preserved disc spaces.  Coronal reformatted images demonstrate an intact craniocervical junction, intact C1-C2 articulation, intact dens, and intact lateral masses. No significant bony foraminal stenoses.  IMPRESSION:  1.  No cervical spine fractures identified. 2.  Partial fusion of the posterior elements of C2 and C3 on the right which is likely congenital.   Original Report Authenticated By: Hulan Saas, M.D.    Ct Cervical Spine Wo Contrast  09/05/2012  *RADIOLOGY REPORT*  Clinical Data:  Larey Seat from a roof approximately 8 feet onto a wooden deck.  CT HEAD WITHOUT CONTRAST CT CERVICAL SPINE WITHOUT CONTRAST  Technique:  Multidetector CT imaging of the head and cervical spine was performed following the standard protocol without intravenous contrast.  Multiplanar CT image reconstructions of the cervical spine were also generated.  Comparison:  None.  CT HEAD  Findings: Ventricular system normal in size and appearance for age. No mass lesion.  No midline shift.  No acute hemorrhage or hematoma.  No extra-axial fluid collections.  No evidence of acute infarction.  No focal brain parenchymal abnormalities.  No skull fractures or other focal osseous abnormalities involving the skull.  Visualized paranasal sinuses, mastoid air cells, and middle ear cavities well-aerated.  Note is made of bony nasal septal deviation to the left.  IMPRESSION: Normal examination.  CT CERVICAL SPINE  Findings: No fractures identified involving the cervical spine. Sagittal reconstructed images demonstrate  anatomic alignment. There is partial fusion of  the posterior elements at C2-3 on the right which is likely congenital.  Facet joints intact throughout. No spinal stenosis.  Well-preserved disc spaces.  Coronal reformatted images demonstrate an intact craniocervical junction, intact C1-C2 articulation, intact dens, and intact lateral masses. No significant bony foraminal stenoses.  IMPRESSION:  1.  No cervical spine fractures identified. 2.  Partial fusion of the posterior elements of C2 and C3 on the right which is likely congenital.   Original Report Authenticated By: Hulan Saas, M.D.     ROS Blood pressure 151/72, pulse 86, resp. rate 18, SpO2 100.00%. Physical Exam Healthy appearing male in obvious distress with the right hip flexed up. Unable to range the right LE due to pain.  L LE with painless ROM NVI distally bilateral LE.  Bilateral UE with painless ROM, no deformity, no tenderness  Assessment/Plan: Right hip fracture dislocation Plan closed reduction and possible acetabular ORIF later(Hand[place]) CT scan post reduction ordered  Christabel Camire,STEVEN R 09/05/2012, 3:55 PM

## 2012-09-05 NOTE — ED Notes (Signed)
Pt has multiple abrasions and bruising to lower back and left flank, left upper arm bruising noted. XR in room at this time for portable chest after chest tube placement

## 2012-09-05 NOTE — ED Notes (Signed)
Lactic acid results shown to Dr. Dione Booze

## 2012-09-05 NOTE — ED Provider Notes (Signed)
History     CSN: [id number]  Arrival date & time 09/05/12  1259   First MD Initiated Contact with Patient 09/05/12 1300      Chief Complaint  Patient presents with  . Fall    (Consider location/radiation/quality/duration/timing/severity/associated sxs/prior treatment) HPI  52 year old male presents for evaluation of a fall.  Pt reports he was cleaning leaves off the roof when he fell off and landed on wooden floor.  Unsure if he hits his head or LOC, and were able to craw back into the house.  He then called his wife who call EMS to bring him to hospital.  Incident happened 1 hr ago.  Currently c/o lower chest, upper abdomen, right hip, left abdomen, left hip pain.  Pain is severe, 9/10, worsening with movement.  Denies headache, neck pain, increase sob, numbness or tingling sensation.  No alcohol use.  Pt received Fentanyl per EMS PTA.  Does not take any blood thinner medication.    Past Medical History  Diagnosis Date  . Left inguinal hernia   . Right inguinal hernia   . Hypertension   . Hyperlipidemia   . Asthma   . Allergy   . Postsurgical aortocoronary bypass status   . CAD (coronary artery disease)     Past Surgical History  Procedure Date  . Double coronary artery bypass   . Hernia repair     left and right inguinal    Family History  Problem Relation Age of Onset  . Hypertension Mother   . Hyperlipidemia Mother   . Hypertension Father   . Hyperlipidemia Father     History  Substance Use Topics  . Smoking status: Never Smoker   . Smokeless tobacco: Not on file  . Alcohol Use: No      Review of Systems  All other systems reviewed and are negative.    Allergies  Cefaclor  Home Medications   Current Outpatient Rx  Name  Route  Sig  Dispense  Refill  . ACETAMINOPHEN 500 MG PO TABS   Oral   Take 500 mg by mouth every 6 (six) hours as needed. For pain/fever         . ASPIRIN 325 MG PO TABS   Oral   Take 325 mg by mouth daily.           . ATORVASTATIN CALCIUM 20 MG PO TABS   Oral   Take 1 tablet (20 mg total) by mouth daily.   90 tablet   3   . CETIRIZINE HCL 10 MG PO TABS   Oral   Take 10 mg by mouth daily.         Marland Kitchen MELATONIN 3 MG PO TABS   Oral   Take 1 tablet by mouth at bedtime as needed. For sleep           SpO2 99%  Physical Exam  Nursing note and vitals reviewed. Constitutional: He is oriented to person, place, and time. He appears well-developed and well-nourished. He appears distressed (uncomfortable appearing, skin is pale).  HENT:  Head: Normocephalic and atraumatic.  Right Ear: External ear normal.  Left Ear: External ear normal.  Nose: Nose normal.  Mouth/Throat: Oropharynx is clear and moist. No oropharyngeal exudate.       No hemotympanum, no septal hematoma, no midface tenderness, no malocclusion  Eyes: Conjunctivae normal and EOM are normal. Pupils are equal, round, and reactive to light.  Neck: Normal range of motion. Neck supple.  Cardiovascular: Normal  rate and regular rhythm.  Exam reveals no gallop and no friction rub.   No murmur heard. Pulmonary/Chest: Effort normal and breath sounds normal. No respiratory distress. He has no wheezes. He exhibits tenderness (tenderness to Left lower ribs on palpation, without crepitus or deformity noted.  ).       Decreased but presence breath sound to L lung as compare to R.   Abdominal: Soft. He exhibits no distension and no mass. There is tenderness (moderate tenderness to Left upper abdomen and left lateral abdomen, with bruising noted to L flank.  No rebound tenderness). There is guarding. There is no rebound.       Superficial skin abrasion noted to perineum with skin tear.  Normal rectal tone, no frank rectal bleeding noted.    Musculoskeletal: He exhibits tenderness (moderate tenderness to R posterior hip and inferior aspect of proximal R femur region without obvious deformity noted. Tenderness to posterior aspect of proximal R femur ).  He exhibits no edema.       Cervical back: Normal.       Thoracic back: Normal.       Lumbar back: Normal.  Neurological: He is alert and oriented to person, place, and time.  Skin: There is pallor.    ED Course  Procedures (including critical care time)  Results for orders placed during the hospital encounter of 09/05/12  CBC WITH DIFFERENTIAL      Component Value Range   WBC 23.2 (*) 4.0 - 10.5 K/uL   RBC 4.18 (*) 4.22 - 5.81 MIL/uL   Hemoglobin 12.7 (*) 13.0 - 17.0 g/dL   HCT 16.1 (*) 09.6 - 04.5 %   MCV 87.6  78.0 - 100.0 fL   MCH 30.4  26.0 - 34.0 pg   MCHC 34.7  30.0 - 36.0 g/dL   RDW 40.9  81.1 - 91.4 %   Platelets 198  150 - 400 K/uL   Neutrophils Relative 88 (*) 43 - 77 %   Lymphocytes Relative 5 (*) 12 - 46 %   Monocytes Relative 7  3 - 12 %   Eosinophils Relative 0  0 - 5 %   Basophils Relative 0  0 - 1 %   Neutro Abs 20.4 (*) 1.7 - 7.7 K/uL   Lymphs Abs 1.2  0.7 - 4.0 K/uL   Monocytes Absolute 1.6 (*) 0.1 - 1.0 K/uL   Eosinophils Absolute 0.0  0.0 - 0.7 K/uL   Basophils Absolute 0.0  0.0 - 0.1 K/uL   Smear Review MORPHOLOGY UNREMARKABLE    COMPREHENSIVE METABOLIC PANEL      Component Value Range   Sodium 137  135 - 145 mEq/L   Potassium 3.3 (*) 3.5 - 5.1 mEq/L   Chloride 102  96 - 112 mEq/L   CO2 22  19 - 32 mEq/L   Glucose, Bld 116 (*) 70 - 99 mg/dL   BUN 22  6 - 23 mg/dL   Creatinine, Ser 7.82  0.50 - 1.35 mg/dL   Calcium 8.5  8.4 - 95.6 mg/dL   Total Protein 5.9 (*) 6.0 - 8.3 g/dL   Albumin 3.5  3.5 - 5.2 g/dL   AST 34  0 - 37 U/L   ALT 24  0 - 53 U/L   Alkaline Phosphatase 63  39 - 117 U/L   Total Bilirubin 0.6  0.3 - 1.2 mg/dL   GFR calc non Af Amer >90  >90 mL/min   GFR calc Af Amer >90  >  90 mL/min  LIPASE, BLOOD      Component Value Range   Lipase 34  11 - 59 U/L  CG4 I-STAT (LACTIC ACID)      Component Value Range   Lactic Acid, Venous 2.71 (*) 0.5 - 2.2 mmol/L   Ct Head Wo Contrast  09/05/2012  *RADIOLOGY REPORT*  Clinical Data:  Larey Seat  from a roof approximately 8 feet onto a wooden deck.  CT HEAD WITHOUT CONTRAST CT CERVICAL SPINE WITHOUT CONTRAST  Technique:  Multidetector CT imaging of the head and cervical spine was performed following the standard protocol without intravenous contrast.  Multiplanar CT image reconstructions of the cervical spine were also generated.  Comparison:  None.  CT HEAD  Findings: Ventricular system normal in size and appearance for age. No mass lesion.  No midline shift.  No acute hemorrhage or hematoma.  No extra-axial fluid collections.  No evidence of acute infarction.  No focal brain parenchymal abnormalities.  No skull fractures or other focal osseous abnormalities involving the skull.  Visualized paranasal sinuses, mastoid air cells, and middle ear cavities well-aerated.  Note is made of bony nasal septal deviation to the left.  IMPRESSION: Normal examination.  CT CERVICAL SPINE  Findings: No fractures identified involving the cervical spine. Sagittal reconstructed images demonstrate anatomic alignment. There is partial fusion of the posterior elements at C2-3 on the right which is likely congenital.  Facet joints intact throughout. No spinal stenosis.  Well-preserved disc spaces.  Coronal reformatted images demonstrate an intact craniocervical junction, intact C1-C2 articulation, intact dens, and intact lateral masses. No significant bony foraminal stenoses.  IMPRESSION:  1.  No cervical spine fractures identified. 2.  Partial fusion of the posterior elements of C2 and C3 on the right which is likely congenital.   Original Report Authenticated By: Hulan Saas, M.D.    Ct Chest W Contrast  09/05/2012  *RADIOLOGY REPORT*  Clinical Data:  Larey Seat off of a roof approximately 8 feet onto a wooden deck.  CT CHEST, ABDOMEN AND PELVIS WITH CONTRAST  Technique:  Multidetector CT imaging of the chest, abdomen and pelvis was performed following the standard protocol during bolus administration of intravenous contrast.   Contrast: OMNIPAQUE IOHEXOL 300 MG/ML  SOLN  Comparison:   None.  CT CHEST  Findings:  No evidence of mediastinal hematoma.  No hemopericardium or hemomediastinum. Small amount of pneumomediastinum, with the gas distributed along the mid and distal esophagus.  Large left pneumothorax, tethered in places to the pleura by scar. The pneumothorax is on the order of 50-60% or so.  Airspace opacities deep in the left lower lobe consistent with contusion. Right lung clear without evidence of contusion.  No right pneumothorax.  Very small left hemothorax.  Prior sternotomy for CABG.  Heart normal in size.  Focal bulge of the right ventricle anteriorly at the level of the outflow tract may be due to prior pericardial resection.  Bone window images demonstrate no visible fractures involving the bony thorax.  IMPRESSION:  1.  Large left pneumothorax on the order of 50-60% or so.  Very small associated left hemothorax. 2.  Contusion involving the left lower lobe. 3.  Pneumomediastinum.  The distribution of the gas bubbles along the mid and distal esophagus raises the question of esophageal injury.  CT ABDOMEN AND PELVIS  Findings:  Large hematoma in the subcutaneous tissues of the left flank measuring approximately 10.9 x 4.1 x 10.0 cm, with overlying ecchymosis in the subcutaneous tissues of the left flank.  Fractures involving the left transverse processes of L2, L3, and L4.  Posterior dislocation of the right hip joint, with an associated displaced fracture involving the posterior wall of the acetabulum; there are intra-articular bone fragments as well. Sacroiliac joints and symphysis pubis intact without evidence of the diastasis.  No other fractures.  Normal appearing liver, spleen, pancreas, adrenal glands, and right kidney.  Approximate 1.5 cm simple cyst arising from the mid left kidney; no significant abnormalities involving the left kidney. Gallbladder unremarkable by CT.  No biliary ductal dilation.  Mild  bilateral iliac atherosclerosis.  No significant lymphadenopathy.  Normal-appearing stomach and small bowel.  Descending and sigmoid colon diverticulosis without evidence of acute diverticulitis. Cecum extends low into the pelvis, and normal appearing short appendix in the right mid pelvis.  Urinary bladder unremarkable.  Small amount of retroperitoneal hemorrhage in the right side of the low pelvis. Normal-appearing prostate gland and seminal vesicles.  Note is made note made of what is either fluid or possibly undescended testes in both inguinal canals.  IMPRESSION:  1.  No evidence of acute traumatic injury to the abdominal or pelvic viscera. 2.  Posterior dislocation of the right hip joint with an associated fracture involving the posterior wall of the acetabulum which is displaced.  There is an associated hip joint hemarthrosis and there are fracture fragments within the joint. 3.  Fractures involving the left transverse processes of L2, L3, and L4. 4.  Large hematoma in the subcutaneous tissues of the left flank, measured above.  There is overlying ecchymosis. 5.  Fluid in the inguinal canals bilaterally versus undescended testes. 6.  Scattered descending and sigmoid colon diverticula without evidence of acute diverticulitis. 7.  Small amount of retroperitoneal hemorrhage in the right side of the low pelvis.  Critical Value/emergent results were called by telephone at the time of interpretation on 09/05/2012 at 1555 hours to Dr. Preston Fleeting of the emergency department, who verbally acknowledged these results.   Original Report Authenticated By: Hulan Saas, M.D.    Ct Cervical Spine Wo Contrast  09/05/2012  *RADIOLOGY REPORT*  Clinical Data:  Larey Seat from a roof approximately 8 feet onto a wooden deck.  CT HEAD WITHOUT CONTRAST CT CERVICAL SPINE WITHOUT CONTRAST  Technique:  Multidetector CT imaging of the head and cervical spine was performed following the standard protocol without intravenous contrast.   Multiplanar CT image reconstructions of the cervical spine were also generated.  Comparison:  None.  CT HEAD  Findings: Ventricular system normal in size and appearance for age. No mass lesion.  No midline shift.  No acute hemorrhage or hematoma.  No extra-axial fluid collections.  No evidence of acute infarction.  No focal brain parenchymal abnormalities.  No skull fractures or other focal osseous abnormalities involving the skull.  Visualized paranasal sinuses, mastoid air cells, and middle ear cavities well-aerated.  Note is made of bony nasal septal deviation to the left.  IMPRESSION: Normal examination.  CT CERVICAL SPINE  Findings: No fractures identified involving the cervical spine. Sagittal reconstructed images demonstrate anatomic alignment. There is partial fusion of the posterior elements at C2-3 on the right which is likely congenital.  Facet joints intact throughout. No spinal stenosis.  Well-preserved disc spaces.  Coronal reformatted images demonstrate an intact craniocervical junction, intact C1-C2 articulation, intact dens, and intact lateral masses. No significant bony foraminal stenoses.  IMPRESSION:  1.  No cervical spine fractures identified. 2.  Partial fusion of the posterior elements of C2 and C3 on the right  which is likely congenital.   Original Report Authenticated By: Hulan Saas, M.D.    Ct Abdomen Pelvis W Contrast  09/05/2012  *RADIOLOGY REPORT*  Clinical Data:  Larey Seat off of a roof approximately 8 feet onto a wooden deck.  CT CHEST, ABDOMEN AND PELVIS WITH CONTRAST  Technique:  Multidetector CT imaging of the chest, abdomen and pelvis was performed following the standard protocol during bolus administration of intravenous contrast.  Contrast: OMNIPAQUE IOHEXOL 300 MG/ML  SOLN  Comparison:   None.  CT CHEST  Findings:  No evidence of mediastinal hematoma.  No hemopericardium or hemomediastinum. Small amount of pneumomediastinum, with the gas distributed along the mid and  distal esophagus.  Large left pneumothorax, tethered in places to the pleura by scar. The pneumothorax is on the order of 50-60% or so.  Airspace opacities deep in the left lower lobe consistent with contusion. Right lung clear without evidence of contusion.  No right pneumothorax.  Very small left hemothorax.  Prior sternotomy for CABG.  Heart normal in size.  Focal bulge of the right ventricle anteriorly at the level of the outflow tract may be due to prior pericardial resection.  Bone window images demonstrate no visible fractures involving the bony thorax.  IMPRESSION:  1.  Large left pneumothorax on the order of 50-60% or so.  Very small associated left hemothorax. 2.  Contusion involving the left lower lobe. 3.  Pneumomediastinum.  The distribution of the gas bubbles along the mid and distal esophagus raises the question of esophageal injury.  CT ABDOMEN AND PELVIS  Findings:  Large hematoma in the subcutaneous tissues of the left flank measuring approximately 10.9 x 4.1 x 10.0 cm, with overlying ecchymosis in the subcutaneous tissues of the left flank. Fractures involving the left transverse processes of L2, L3, and L4.  Posterior dislocation of the right hip joint, with an associated displaced fracture involving the posterior wall of the acetabulum; there are intra-articular bone fragments as well. Sacroiliac joints and symphysis pubis intact without evidence of the diastasis.  No other fractures.  Normal appearing liver, spleen, pancreas, adrenal glands, and right kidney.  Approximate 1.5 cm simple cyst arising from the mid left kidney; no significant abnormalities involving the left kidney. Gallbladder unremarkable by CT.  No biliary ductal dilation.  Mild bilateral iliac atherosclerosis.  No significant lymphadenopathy.  Normal-appearing stomach and small bowel.  Descending and sigmoid colon diverticulosis without evidence of acute diverticulitis. Cecum extends low into the pelvis, and normal appearing  short appendix in the right mid pelvis.  Urinary bladder unremarkable.  Small amount of retroperitoneal hemorrhage in the right side of the low pelvis. Normal-appearing prostate gland and seminal vesicles.  Note is made note made of what is either fluid or possibly undescended testes in both inguinal canals.  IMPRESSION:  1.  No evidence of acute traumatic injury to the abdominal or pelvic viscera. 2.  Posterior dislocation of the right hip joint with an associated fracture involving the posterior wall of the acetabulum which is displaced.  There is an associated hip joint hemarthrosis and there are fracture fragments within the joint. 3.  Fractures involving the left transverse processes of L2, L3, and L4. 4.  Large hematoma in the subcutaneous tissues of the left flank, measured above.  There is overlying ecchymosis. 5.  Fluid in the inguinal canals bilaterally versus undescended testes. 6.  Scattered descending and sigmoid colon diverticula without evidence of acute diverticulitis. 7.  Small amount of retroperitoneal hemorrhage in the  right side of the low pelvis.  Critical Value/emergent results were called by telephone at the time of interpretation on 09/05/2012 at 1555 hours to Dr. Preston Fleeting of the emergency department, who verbally acknowledged these results.   Original Report Authenticated By: Hulan Saas, M.D.      1. Large left pneumothorax 2. Pneumomediastinum 3. R hip posterior dislocation 4. Left transverse processes fx of L2,L3,L4 5.  Left flank hematoma 6. Retroperitoneal hemorrhage   MDM  Pt presents for evaluation of a fall, off 8 foot roof.  Pt is moderately tender to his L flank, and also to R pelvis region.  INcreasing pain to R hip with movement.  Pt appears pale, and having an acute abdomen concerning for splenic injury, and possible pelvis fx.  My attending was made aware and promptly evaluate pt.  Pt is made a level 2 trauma.  Pt will have pan-scan, labs, and pain medication, IVF  started.  Will continue to monitor.  He is currently stable.  Portable cxr ordered.  Due to severity of the situation, my attending recommend CT scan stat for further management.    2:02 PM Although pt has no midline spine tenderness, he may have a distracting injury, c-collar placed.  Pain medication given.  IVF established.  CT scans ordered.  3:10 PM Preliminary reading of CT and xray by Dr. Preston Fleeting revealed a L pneumothorax, fx of spinal processes in L spine, and R hip posterior dislocation. Pt is made aware of the result.  Pt is currently pain controlled.  Trauma and ortho has been consulted.    4:09 PM Pt currently having a chest tube placed here in ED by PCCM.  Ortho has seen pt and recommend reduction of R hip with conscious sedation.    CRITICAL CARE Performed by: Fayrene Helper   Total critical care time: 30 min  Critical care time was exclusive of separately billable procedures and treating other patients.  Critical care was necessary to treat or prevent imminent or life-threatening deterioration.  Critical care was time spent personally by me on the following activities: development of treatment plan with patient and/or surrogate as well as nursing, discussions with consultants, evaluation of patient's response to treatment, examination of patient, obtaining history from patient or surrogate, ordering and performing treatments and interventions, ordering and review of laboratory studies, ordering and review of radiographic studies, pulse oximetry and re-evaluation of patient's condition.  4:12 PM Pt will be admitted for further management of his condition.    BP 151/72  Pulse 86  Resp 18  SpO2 100%  I have reviewed nursing notes and vital signs. I personally reviewed the imaging tests through PACS system  I reviewed available ER/hospitalization records thought the EMR       Fayrene Helper, PA-C 09/05/12 1620

## 2012-09-05 NOTE — ED Notes (Addendum)
Pt back from CT and on monitor. Family at bed side. Pt last ate at 0900. water prior to EMS arrival.

## 2012-09-05 NOTE — ED Notes (Signed)
Pt tolerated chest tube placement and reduction of right hip well. Family at bed side and pt reports pain 2:10

## 2012-09-05 NOTE — ED Provider Notes (Addendum)
51 year old male fell approximately 8 feet off a roof onto a wooden deck. He is complaining of pain in his right hip, left lateral abdomen and left chest. He was able to walk into the house does not remember walking. Pain is severe and he rates a 9/10. He denies any head or neck injury. On exam, there is no obvious head or neck injury. Lungs are clear and heart has regular rate and rhythm. There is moderate tenderness in the left lower rib cage and left upper abdomen and moderate to severe tenderness in the right lateral pelvis appeared to severe pain on range of motion of the right hip. There is no tenderness along the spine. Neurovascular exam is intact. Level II trauma is activated and he is being sent for pan-scan CT scans. Portable chest and pelvis films are not obtained since those areas will be imaged by CT scan and he appears clinically stable at this point.  Dione Booze, MD 09/05/12 1353   Date: 09/05/2012  Rate: 77  Rhythm: normal sinus rhythm  QRS Axis: right  Intervals: normal  ST/T Wave abnormalities: normal  Conduction Disutrbances:none  Narrative Interpretation: Right axis deviation, left atrial hypertrophy. No prior ECG available for comparison.  Old EKG Reviewed: none available  CT scan shows multiple injuries including left pneumothorax with pneumomediastinum, fracture or transverse vertebrae of multiple lumbar vertebrae, dislocation of the right hip with fracture of the acetabulum. Dr. Janee Morn of trauma surgery has been consult it and has come to see the patient and his inserting a chest tube. Dr. Ranell Patrick of orthopedics is counseled that it has come to see the patient and has requested that procedural sedation be used to try and relocate his hip in the ED.  Procedural sedation Performed by: [email or web address] Consent: Verbal consent obtained. Risks and benefits: risks, benefits and alternatives were discussed Required items: required blood products, implants, devices, and special  equipment available Patient identity confirmed: arm band and provided demographic data Time out: Immediately prior to procedure a "time out" was called to verify the correct patient, procedure, equipment, support staff and site/side marked as required.  Sedation type: moderate (conscious) sedation NPO time confirmed and considedered  Sedatives: PROPOFOL  Physician Time at Bedside: 30 minutes  Dr. Ranell Patrick has successfully reduced the hip dislocation.  Vitals: Vital signs were monitored during sedation. Cardiac Monitor, pulse oximeter Patient tolerance: Patient tolerated the procedure well with no immediate complications. Comments: Pt with uneventful recovered. Returned to pre-procedural sedation baseline  CRITICAL CARE Performed by: Dione Booze   Total critical care time: 90 minutes  Critical care time was exclusive of separately billable procedures and treating other patients.  Critical care was necessary to treat or prevent imminent or life-threatening deterioration.  Critical care was time spent personally by me on the following activities: development of treatment plan with patient and/or surrogate as well as nursing, discussions with consultants, evaluation of patient's response to treatment, examination of patient, obtaining history from patient or surrogate, ordering and performing treatments and interventions, ordering and review of laboratory studies, ordering and review of radiographic studies, pulse oximetry and re-evaluation of patient's condition.    Medical screening examination/treatment/procedure(s) were conducted as a shared visit with non-physician practitioner(s) and myself.  I personally evaluated the patient during the encounter   Dione Booze, MD 09/05/12 1649

## 2012-09-05 NOTE — Progress Notes (Signed)
Chaplain responded to Level 2 Trauma in Pod A room 1..patient fell off roof.  Found patient complaining of discomfort and pain in his leg and asking for someone to help him.  Wife at bedside holding patient's hand while nurse worked on him. Chaplain provided ministry of presence and stayed until patient appeared a lot more comfortable after receiving pain medication.  Will follow up as needed.

## 2012-09-05 NOTE — ED Notes (Signed)
Family at beside. Family given emotional support. 

## 2012-09-05 NOTE — H&P (Signed)
[name] is an 51 y.o. male.   Chief Complaint: Right hip pain and back pain after fall HPI: Patient was up on his roof cleaning leaves when he fell. He fell into some bushes on his side. He was brought to the [facility] emergency department. He was made a level II trauma on arrival. Evaluation by the emergency department physician revealed 50% left pneumothorax, lumbar transverse process fractures, and right hip dislocation. I was asked to see him from a trauma standpoint.  Past Medical History  Diagnosis Date  . Left inguinal hernia   . Right inguinal hernia   . Hypertension   . Hyperlipidemia   . Asthma   . Allergy   . Postsurgical aortocoronary bypass status   . CAD (coronary artery disease)     Past Surgical History  Procedure Date  . Double coronary artery bypass   . Hernia repair     left and right inguinal    Family History  Problem Relation Age of Onset  . Hypertension Mother   . Hyperlipidemia Mother   . Hypertension Father   . Hyperlipidemia Father    Social History:  reports that he has never smoked. He does not have any smokeless tobacco history on file. He reports that he does not drink alcohol or use illicit drugs.  Allergies:  Allergies  Allergen Reactions  . Cefaclor     REACTION: rash     (Not in a hospital admission)  Results for orders placed during the hospital encounter of 09/05/12 (from the past 48 hour(s))  CBC WITH DIFFERENTIAL     Status: Abnormal   Collection Time   09/05/12  1:50 PM      Component Value Range Comment   WBC 23.2 (*) 4.0 - 10.5 K/uL    RBC 4.18 (*) 4.22 - 5.81 MIL/uL    Hemoglobin 12.7 (*) 13.0 - 17.0 g/dL    HCT 16.1 (*) 09.6 - 52.0 %    MCV 87.6  78.0 - 100.0 fL    MCH 30.4  26.0 - 34.0 pg    MCHC 34.7  30.0 - 36.0 g/dL    RDW 04.5  40.9 - 81.1 %    Platelets 198  150 - 400 K/uL    Neutrophils Relative 88 (*) 43 - 77 %    Lymphocytes Relative 5 (*) 12 - 46 %    Monocytes Relative 7  3 - 12 %    Eosinophils  Relative 0  0 - 5 %    Basophils Relative 0  0 - 1 %    Neutro Abs 20.4 (*) 1.7 - 7.7 K/uL    Lymphs Abs 1.2  0.7 - 4.0 K/uL    Monocytes Absolute 1.6 (*) 0.1 - 1.0 K/uL    Eosinophils Absolute 0.0  0.0 - 0.7 K/uL    Basophils Absolute 0.0  0.0 - 0.1 K/uL    Smear Review MORPHOLOGY UNREMARKABLE     COMPREHENSIVE METABOLIC PANEL     Status: Abnormal   Collection Time   09/05/12  1:50 PM      Component Value Range Comment   Sodium 137  135 - 145 mEq/L    Potassium 3.3 (*) 3.5 - 5.1 mEq/L    Chloride 102  96 - 112 mEq/L    CO2 22  19 - 32 mEq/L    Glucose, Bld 116 (*) 70 - 99 mg/dL    BUN 22  6 - 23 mg/dL    Creatinine,  Ser 0.89  0.50 - 1.35 mg/dL    Calcium 8.5  8.4 - 41.3 mg/dL    Total Protein 5.9 (*) 6.0 - 8.3 g/dL    Albumin 3.5  3.5 - 5.2 g/dL    AST 34  0 - 37 U/L    ALT 24  0 - 53 U/L    Alkaline Phosphatase 63  39 - 117 U/L    Total Bilirubin 0.6  0.3 - 1.2 mg/dL    GFR calc non Af Amer >90  >90 mL/min    GFR calc Af Amer >90  >90 mL/min   LIPASE, BLOOD     Status: Normal   Collection Time   09/05/12  1:50 PM      Component Value Range Comment   Lipase 34  11 - 59 U/L   CG4 I-STAT (LACTIC ACID)     Status: Abnormal   Collection Time   09/05/12  2:05 PM      Component Value Range Comment   Lactic Acid, Venous 2.71 (*) 0.5 - 2.2 mmol/L   URINALYSIS, ROUTINE W REFLEX MICROSCOPIC     Status: Abnormal   Collection Time   09/05/12  4:11 PM      Component Value Range Comment   Color, Urine YELLOW  YELLOW    APPearance CLEAR  CLEAR    Specific Gravity, Urine 1.033 (*) 1.005 - 1.030    pH 5.5  5.0 - 8.0    Glucose, UA NEGATIVE  NEGATIVE mg/dL    Hgb urine dipstick LARGE (*) NEGATIVE    Bilirubin Urine NEGATIVE  NEGATIVE    Ketones, ur 15 (*) NEGATIVE mg/dL    Protein, ur NEGATIVE  NEGATIVE mg/dL    Urobilinogen, UA 0.2  0.0 - 1.0 mg/dL    Nitrite NEGATIVE  NEGATIVE    Leukocytes, UA NEGATIVE  NEGATIVE   URINE MICROSCOPIC-ADD ON     Status: Normal   Collection Time     09/05/12  4:11 PM      Component Value Range Comment   Squamous Epithelial / LPF RARE  RARE    WBC, UA 0-2  <3 WBC/hpf    RBC / HPF 11-20  <3 RBC/hpf    Ct Head Wo Contrast  09/05/2012  *RADIOLOGY REPORT*  Clinical Data:  Larey Seat from a roof approximately 8 feet onto a wooden deck.  CT HEAD WITHOUT CONTRAST CT CERVICAL SPINE WITHOUT CONTRAST  Technique:  Multidetector CT imaging of the head and cervical spine was performed following the standard protocol without intravenous contrast.  Multiplanar CT image reconstructions of the cervical spine were also generated.  Comparison:  None.  CT HEAD  Findings: Ventricular system normal in size and appearance for age. No mass lesion.  No midline shift.  No acute hemorrhage or hematoma.  No extra-axial fluid collections.  No evidence of acute infarction.  No focal brain parenchymal abnormalities.  No skull fractures or other focal osseous abnormalities involving the skull.  Visualized paranasal sinuses, mastoid air cells, and middle ear cavities well-aerated.  Note is made of bony nasal septal deviation to the left.  IMPRESSION: Normal examination.  CT CERVICAL SPINE  Findings: No fractures identified involving the cervical spine. Sagittal reconstructed images demonstrate anatomic alignment. There is partial fusion of the posterior elements at C2-3 on the right which is likely congenital.  Facet joints intact throughout. No spinal stenosis.  Well-preserved disc spaces.  Coronal reformatted images demonstrate an intact craniocervical junction, intact C1-C2 articulation, intact dens, and  intact lateral masses. No significant bony foraminal stenoses.  IMPRESSION:  1.  No cervical spine fractures identified. 2.  Partial fusion of the posterior elements of C2 and C3 on the right which is likely congenital.   Original Report Authenticated By: Hulan Saas, M.D.    Ct Chest W Contrast  09/05/2012  *RADIOLOGY REPORT*  Clinical Data:  Larey Seat off of a roof approximately 8  feet onto a wooden deck.  CT CHEST, ABDOMEN AND PELVIS WITH CONTRAST  Technique:  Multidetector CT imaging of the chest, abdomen and pelvis was performed following the standard protocol during bolus administration of intravenous contrast.  Contrast: OMNIPAQUE IOHEXOL 300 MG/ML  SOLN  Comparison:   None.  CT CHEST  Findings:  No evidence of mediastinal hematoma.  No hemopericardium or hemomediastinum. Small amount of pneumomediastinum, with the gas distributed along the mid and distal esophagus.  Large left pneumothorax, tethered in places to the pleura by scar. The pneumothorax is on the order of 50-60% or so.  Airspace opacities deep in the left lower lobe consistent with contusion. Right lung clear without evidence of contusion.  No right pneumothorax.  Very small left hemothorax.  Prior sternotomy for CABG.  Heart normal in size.  Focal bulge of the right ventricle anteriorly at the level of the outflow tract may be due to prior pericardial resection.  Bone window images demonstrate no visible fractures involving the bony thorax.  IMPRESSION:  1.  Large left pneumothorax on the order of 50-60% or so.  Very small associated left hemothorax. 2.  Contusion involving the left lower lobe. 3.  Pneumomediastinum.  The distribution of the gas bubbles along the mid and distal esophagus raises the question of esophageal injury.  CT ABDOMEN AND PELVIS  Findings:  Large hematoma in the subcutaneous tissues of the left flank measuring approximately 10.9 x 4.1 x 10.0 cm, with overlying ecchymosis in the subcutaneous tissues of the left flank. Fractures involving the left transverse processes of L2, L3, and L4.  Posterior dislocation of the right hip joint, with an associated displaced fracture involving the posterior wall of the acetabulum; there are intra-articular bone fragments as well. Sacroiliac joints and symphysis pubis intact without evidence of the diastasis.  No other fractures.  Normal appearing liver, spleen,  pancreas, adrenal glands, and right kidney.  Approximate 1.5 cm simple cyst arising from the mid left kidney; no significant abnormalities involving the left kidney. Gallbladder unremarkable by CT.  No biliary ductal dilation.  Mild bilateral iliac atherosclerosis.  No significant lymphadenopathy.  Normal-appearing stomach and small bowel.  Descending and sigmoid colon diverticulosis without evidence of acute diverticulitis. Cecum extends low into the pelvis, and normal appearing short appendix in the right mid pelvis.  Urinary bladder unremarkable.  Small amount of retroperitoneal hemorrhage in the right side of the low pelvis. Normal-appearing prostate gland and seminal vesicles.  Note is made note made of what is either fluid or possibly undescended testes in both inguinal canals.  IMPRESSION:  1.  No evidence of acute traumatic injury to the abdominal or pelvic viscera. 2.  Posterior dislocation of the right hip joint with an associated fracture involving the posterior wall of the acetabulum which is displaced.  There is an associated hip joint hemarthrosis and there are fracture fragments within the joint. 3.  Fractures involving the left transverse processes of L2, L3, and L4. 4.  Large hematoma in the subcutaneous tissues of the left flank, measured above.  There is overlying ecchymosis. 5.  Fluid in the inguinal canals bilaterally versus undescended testes. 6.  Scattered descending and sigmoid colon diverticula without evidence of acute diverticulitis. 7.  Small amount of retroperitoneal hemorrhage in the right side of the low pelvis.  Critical Value/emergent results were called by telephone at the time of interpretation on 09/05/2012 at 1555 hours to Dr. Preston Fleeting of the emergency department, who verbally acknowledged these results.   Original Report Authenticated By: Hulan Saas, M.D.    Ct Cervical Spine Wo Contrast  09/05/2012  *RADIOLOGY REPORT*  Clinical Data:  Larey Seat from a roof approximately 8 feet  onto a wooden deck.  CT HEAD WITHOUT CONTRAST CT CERVICAL SPINE WITHOUT CONTRAST  Technique:  Multidetector CT imaging of the head and cervical spine was performed following the standard protocol without intravenous contrast.  Multiplanar CT image reconstructions of the cervical spine were also generated.  Comparison:  None.  CT HEAD  Findings: Ventricular system normal in size and appearance for age. No mass lesion.  No midline shift.  No acute hemorrhage or hematoma.  No extra-axial fluid collections.  No evidence of acute infarction.  No focal brain parenchymal abnormalities.  No skull fractures or other focal osseous abnormalities involving the skull.  Visualized paranasal sinuses, mastoid air cells, and middle ear cavities well-aerated.  Note is made of bony nasal septal deviation to the left.  IMPRESSION: Normal examination.  CT CERVICAL SPINE  Findings: No fractures identified involving the cervical spine. Sagittal reconstructed images demonstrate anatomic alignment. There is partial fusion of the posterior elements at C2-3 on the right which is likely congenital.  Facet joints intact throughout. No spinal stenosis.  Well-preserved disc spaces.  Coronal reformatted images demonstrate an intact craniocervical junction, intact C1-C2 articulation, intact dens, and intact lateral masses. No significant bony foraminal stenoses.  IMPRESSION:  1.  No cervical spine fractures identified. 2.  Partial fusion of the posterior elements of C2 and C3 on the right which is likely congenital.   Original Report Authenticated By: Hulan Saas, M.D.    Ct Abdomen Pelvis W Contrast  09/05/2012  *RADIOLOGY REPORT*  Clinical Data:  Larey Seat off of a roof approximately 8 feet onto a wooden deck.  CT CHEST, ABDOMEN AND PELVIS WITH CONTRAST  Technique:  Multidetector CT imaging of the chest, abdomen and pelvis was performed following the standard protocol during bolus administration of intravenous contrast.  Contrast:  OMNIPAQUE IOHEXOL 300 MG/ML  SOLN  Comparison:   None.  CT CHEST  Findings:  No evidence of mediastinal hematoma.  No hemopericardium or hemomediastinum. Small amount of pneumomediastinum, with the gas distributed along the mid and distal esophagus.  Large left pneumothorax, tethered in places to the pleura by scar. The pneumothorax is on the order of 50-60% or so.  Airspace opacities deep in the left lower lobe consistent with contusion. Right lung clear without evidence of contusion.  No right pneumothorax.  Very small left hemothorax.  Prior sternotomy for CABG.  Heart normal in size.  Focal bulge of the right ventricle anteriorly at the level of the outflow tract may be due to prior pericardial resection.  Bone window images demonstrate no visible fractures involving the bony thorax.  IMPRESSION:  1.  Large left pneumothorax on the order of 50-60% or so.  Very small associated left hemothorax. 2.  Contusion involving the left lower lobe. 3.  Pneumomediastinum.  The distribution of the gas bubbles along the mid and distal esophagus raises the question of esophageal injury.  CT ABDOMEN AND  PELVIS  Findings:  Large hematoma in the subcutaneous tissues of the left flank measuring approximately 10.9 x 4.1 x 10.0 cm, with overlying ecchymosis in the subcutaneous tissues of the left flank. Fractures involving the left transverse processes of L2, L3, and L4.  Posterior dislocation of the right hip joint, with an associated displaced fracture involving the posterior wall of the acetabulum; there are intra-articular bone fragments as well. Sacroiliac joints and symphysis pubis intact without evidence of the diastasis.  No other fractures.  Normal appearing liver, spleen, pancreas, adrenal glands, and right kidney.  Approximate 1.5 cm simple cyst arising from the mid left kidney; no significant abnormalities involving the left kidney. Gallbladder unremarkable by CT.  No biliary ductal dilation.  Mild bilateral iliac  atherosclerosis.  No significant lymphadenopathy.  Normal-appearing stomach and small bowel.  Descending and sigmoid colon diverticulosis without evidence of acute diverticulitis. Cecum extends low into the pelvis, and normal appearing short appendix in the right mid pelvis.  Urinary bladder unremarkable.  Small amount of retroperitoneal hemorrhage in the right side of the low pelvis. Normal-appearing prostate gland and seminal vesicles.  Note is made note made of what is either fluid or possibly undescended testes in both inguinal canals.  IMPRESSION:  1.  No evidence of acute traumatic injury to the abdominal or pelvic viscera. 2.  Posterior dislocation of the right hip joint with an associated fracture involving the posterior wall of the acetabulum which is displaced.  There is an associated hip joint hemarthrosis and there are fracture fragments within the joint. 3.  Fractures involving the left transverse processes of L2, L3, and L4. 4.  Large hematoma in the subcutaneous tissues of the left flank, measured above.  There is overlying ecchymosis. 5.  Fluid in the inguinal canals bilaterally versus undescended testes. 6.  Scattered descending and sigmoid colon diverticula without evidence of acute diverticulitis. 7.  Small amount of retroperitoneal hemorrhage in the right side of the low pelvis.  Critical Value/emergent results were called by telephone at the time of interpretation on 09/05/2012 at 1555 hours to Dr. Preston Fleeting of the emergency department, who verbally acknowledged these results.   Original Report Authenticated By: Hulan Saas, M.D.    Dg Pelvis Portable  09/05/2012  *RADIOLOGY REPORT*  Clinical Data: Post reduction right hip dislocation.  PORTABLE AP PELVIS 09/05/2012 1644 hours:  Comparison: CT pelvis earlier same date 1452 hours.  Findings: Anatomic alignment of the right hip joint after reduction.  Right acetabular fracture again demonstrated.  No other visible pelvic fractures.   IMPRESSION: Anatomic alignment of the right hip joint post reduction.   Original Report Authenticated By: Hulan Saas, M.D.     Review of Systems  Constitutional: Negative.   HENT: Negative.   Eyes: Negative.   Respiratory:       Some pain with deep inspiration left chest  Cardiovascular: Negative.   Gastrointestinal: Negative.   Genitourinary: Negative.   Musculoskeletal:       Significant pain right hip  Skin:       abrasions  Neurological: Negative.   Endo/Heme/Allergies: Negative.     Blood pressure 125/64, pulse 63, resp. rate 13, SpO2 100.00%. Physical Exam  Constitutional: He is oriented to person, place, and time. He appears well-developed and well-nourished.  HENT:  Head: Normocephalic and atraumatic.  Nose: Nose normal.  Mouth/Throat: Oropharynx is clear and moist.  Eyes: EOM are normal. Pupils are equal, round, and reactive to light. No scleral icterus.  Neck: Normal range of  motion. Neck supple. No tracheal deviation present.       No posterior midline tenderness, no pain with active range of motion  Cardiovascular: Normal rate, regular rhythm, normal heart sounds and intact distal pulses.   Respiratory: Effort normal and breath sounds normal. No respiratory distress. He has no wheezes. He has no rales.  GI: Soft. Bowel sounds are normal. He exhibits no distension. There is no tenderness. There is no rebound and no guarding.       Tenderness left side of lumbar vertebrae  Musculoskeletal:       Right lower extremity foreshortened and medially rotated  Neurological: He is alert and oriented to person, place, and time. No sensory deficit. GCS eye subscore is 4. GCS verbal subscore is 5. GCS motor subscore is 6.  Skin: Skin is warm and dry. He is not diaphoretic.     Assessment/Plan Status post fall with left pneumothorax, pneumomediastinum,Right hip dislocation with posterior acetabular fracture, L2-4 left transverse process fractures, left flank hematoma. Left  chest tube was placed in the emergency department.C-spine cleared. He was seen urgently by orthopedics, Dr. Ranell Patrick proceed with hip reduction in the emergency department. Will admit to intensive care unit. Check chest x-ray. Plan was discussed in detail the patient and his wife.  Jaklyn Alen E 09/05/2012, 5:40 PM

## 2012-09-05 NOTE — ED Notes (Signed)
Family updated as to patient's status.

## 2012-09-05 NOTE — Procedures (Signed)
Chest Tube Insertion Procedure Note  Indications:  Clinically significant Pneumothorax  Pre-operative Diagnosis: Left pneumothorax  Post-operative Diagnosis: Left pneumothorax  Procedure Details  Informed consent was obtained for the procedure, including sedation.  Risks of lung perforation, hemorrhage, arrhythmia, and adverse drug reaction were discussed.   After sterile skin prep, using standard technique, a 20 French tube was placed in the left Anterior axillary line at the nipple level. Large rush of air.  Findings: 10 cc of blood             Complications:  None; patient tolerated the procedure well.         Disposition: Emergency department with admission orders         Condition: stable  Gary Gelinas, MD, MPH, FACS Pager: [phone]

## 2012-09-05 NOTE — Progress Notes (Signed)
Orthopedic Tech Progress Note Patient Details:  [name] 1961/06/03 [id number]  Ortho Devices Type of Ortho Device: Knee Immobilizer Ortho Device/Splint Location: RIGHT KNEE IMMOBILIZER Ortho Device/Splint Interventions: Application   [name] 09/05/2012, 5:17 PM

## 2012-09-06 ENCOUNTER — Inpatient Hospital Stay (HOSPITAL_COMMUNITY): Payer: BC Managed Care – PPO

## 2012-09-06 DIAGNOSIS — S73004A Unspecified dislocation of right hip, initial encounter: Secondary | ICD-10-CM | POA: Diagnosis present

## 2012-09-06 DIAGNOSIS — J982 Interstitial emphysema: Secondary | ICD-10-CM | POA: Diagnosis present

## 2012-09-06 DIAGNOSIS — J939 Pneumothorax, unspecified: Secondary | ICD-10-CM | POA: Diagnosis present

## 2012-09-06 DIAGNOSIS — S32401A Unspecified fracture of right acetabulum, initial encounter for closed fracture: Secondary | ICD-10-CM | POA: Diagnosis present

## 2012-09-06 DIAGNOSIS — S32009A Unspecified fracture of unspecified lumbar vertebra, initial encounter for closed fracture: Secondary | ICD-10-CM | POA: Diagnosis present

## 2012-09-06 LAB — BASIC METABOLIC PANEL
BUN: 15 mg/dL (ref 6–23)
CO2: 27 mEq/L (ref 19–32)
Calcium: 7.4 mg/dL — ABNORMAL LOW (ref 8.4–10.5)
Chloride: 102 mEq/L (ref 96–112)
Creatinine, Ser: 0.97 mg/dL (ref 0.50–1.35)
GFR calc Af Amer: >90 mL/min (ref >90)
GFR calc non Af Amer: >90 mL/min (ref >90)
Glucose, Bld: 119 mg/dL — ABNORMAL HIGH (ref 70–99)
Potassium: 3.9 mEq/L (ref 3.5–5.1)
Sodium: 134 mEq/L — ABNORMAL LOW (ref 135–145)

## 2012-09-06 LAB — CBC
HCT: 25.9 % — ABNORMAL LOW (ref 39.0–52.0)
Hemoglobin: 9.2 g/dL — ABNORMAL LOW (ref 13.0–17.0)
MCH: 31.2 pg (ref 26.0–34.0)
MCHC: 35.5 g/dL (ref 30.0–36.0)
MCV: 87.8 fL (ref 78.0–100.0)
Platelets: 133 10*3/uL — ABNORMAL LOW (ref 150–400)
RBC: 2.95 MIL/uL — ABNORMAL LOW (ref 4.22–5.81)
RDW: 13 % (ref 11.5–15.5)
WBC: 9.2 10*3/uL (ref 4.0–10.5)

## 2012-09-06 MED ORDER — ENOXAPARIN SODIUM 40 MG/0.4ML ~~LOC~~ SOLN
40.0000 mg | SUBCUTANEOUS | Status: DC
  Administered 2012-09-06: 40 mg via SUBCUTANEOUS
  Filled 2012-09-06 (×2): qty 0.4

## 2012-09-06 NOTE — Progress Notes (Signed)
Orthopedics Progress Note  Subjective: My chest hurts the most  Objective:  Filed Vitals:   09/06/12 0712  BP:   Pulse:   Temp: 98.8 F (37.1 C)  Resp:     General: Awake and alert  Musculoskeletal: Right LE with knee immobilizer on. Leg lengths equal, foot alignment normal. Neurovascularly intact  Lab Results  Component Value Date   WBC 9.2 09/06/2012   HGB 9.2* 09/06/2012   HCT 25.9* 09/06/2012   MCV 87.8 09/06/2012   PLT 133* 09/06/2012       Component Value Date/Time   NA 134* 09/06/2012 0445   K 3.9 09/06/2012 0445   CL 102 09/06/2012 0445   CO2 27 09/06/2012 0445   GLUCOSE 119* 09/06/2012 0445   BUN 15 09/06/2012 0445   CREATININE 0.97 09/06/2012 0445   CALCIUM 7.4* 09/06/2012 0445   GFRNONAA >90 09/06/2012 0445   GFRAA >90 09/06/2012 0445    Lab Results  Component Value Date   INR 1.00 03/12/2011   INR 1.0 11/14/2008   INR 1.1 07/02/2007    Assessment/Plan:  s/p hip fracture dislocation after fall 9 feet Post reduction CT scan shows bone fragment in the joint and large posterior wall blow-out fracture.  Patient will require definitive reconstructive surgery for the hip.  I have placed a call to Dr [name], ortho trauma. Hip is reduced.  DVT prophylaxis - SCDs  Almedia Balls. Ranell Patrick, MD 09/06/2012 7:28 AM

## 2012-09-06 NOTE — Progress Notes (Addendum)
Received patient on unit from ICU, AOX4. Patient rated his pain level at the time of arrival on unit as 3/10. His stated pain goal is 5. BP 125/79, HR 59, RR20. O2sat at room air and at rest is 100%.

## 2012-09-06 NOTE — Progress Notes (Signed)
Placed pt on nasal cpap per home use.  Autotitration mode 7-20cm per pt comfort.  Pt is tolerating well at this time.  Hr 66, sats 95%.  RN aware.

## 2012-09-06 NOTE — Progress Notes (Signed)
Subjective: Some pain  Objective: Vital signs in last 24 hours: Temp:  [98.8 F (37.1 C)-99.3 F (37.4 C)] 98.8 F (37.1 C) (12/01 0712) Pulse Rate:  [60-95] 62  (12/01 0800) Resp:  [11-23] 22  (12/01 0800) BP: (109-164)/(45-80) 124/53 mmHg (12/01 0800) SpO2:  [97 %-100 %] 100 % (12/01 0800)    Intake/Output from previous day: 11/30 0701 - 12/01 0700 In: 950 [I.V.:950] Out: 1025 [Urine:1025] Intake/Output this shift: Total I/O In: 395 [P.O.:320; I.V.:75] Out: -   Resp: clear to auscultation bilaterally Chest wall: no tenderness, right sided chest wall tenderness, left sided chest wall tenderness Cardio: regular rate and rhythm, S1, S2 normal, no murmur, click, rub or gallop GI: soft, non-tender; bowel sounds normal; no masses,  no organomegaly No air leak with chest tube left Lab Results:   Basename 09/06/12 0445 09/05/12 1350  WBC 9.2 23.2*  HGB 9.2* 12.7*  HCT 25.9* 36.6*  PLT 133* 198   BMET  Basename 09/06/12 0445 09/05/12 1350  NA 134* 137  K 3.9 3.3*  CL 102 102  CO2 27 22  GLUCOSE 119* 116*  BUN 15 22  CREATININE 0.97 0.89  CALCIUM 7.4* 8.5   PT/INR No results found for this basename: LABPROT:2,INR:2 in the last 72 hours ABG No results found for this basename: PHART:2,PCO2:2,PO2:2,HCO3:2 in the last 72 hours  Studies/Results: Ct Head Wo Contrast  09/05/2012  *RADIOLOGY REPORT*  Clinical Data:  Larey Seat from a roof approximately 8 feet onto a wooden deck.  CT HEAD WITHOUT CONTRAST CT CERVICAL SPINE WITHOUT CONTRAST  Technique:  Multidetector CT imaging of the head and cervical spine was performed following the standard protocol without intravenous contrast.  Multiplanar CT image reconstructions of the cervical spine were also generated.  Comparison:  None.  CT HEAD  Findings: Ventricular system normal in size and appearance for age. No mass lesion.  No midline shift.  No acute hemorrhage or hematoma.  No extra-axial fluid collections.  No evidence of  acute infarction.  No focal brain parenchymal abnormalities.  No skull fractures or other focal osseous abnormalities involving the skull.  Visualized paranasal sinuses, mastoid air cells, and middle ear cavities well-aerated.  Note is made of bony nasal septal deviation to the left.  IMPRESSION: Normal examination.  CT CERVICAL SPINE  Findings: No fractures identified involving the cervical spine. Sagittal reconstructed images demonstrate anatomic alignment. There is partial fusion of the posterior elements at C2-3 on the right which is likely congenital.  Facet joints intact throughout. No spinal stenosis.  Well-preserved disc spaces.  Coronal reformatted images demonstrate an intact craniocervical junction, intact C1-C2 articulation, intact dens, and intact lateral masses. No significant bony foraminal stenoses.  IMPRESSION:  1.  No cervical spine fractures identified. 2.  Partial fusion of the posterior elements of C2 and C3 on the right which is likely congenital.   Original Report Authenticated By: Hulan Saas, M.D.    Ct Chest W Contrast  09/05/2012  *RADIOLOGY REPORT*  Clinical Data:  Larey Seat off of a roof approximately 8 feet onto a wooden deck.  CT CHEST, ABDOMEN AND PELVIS WITH CONTRAST  Technique:  Multidetector CT imaging of the chest, abdomen and pelvis was performed following the standard protocol during bolus administration of intravenous contrast.  Contrast: OMNIPAQUE IOHEXOL 300 MG/ML  SOLN  Comparison:   None.  CT CHEST  Findings:  No evidence of mediastinal hematoma.  No hemopericardium or hemomediastinum. Small amount of pneumomediastinum, with the gas distributed along the mid  and distal esophagus.  Large left pneumothorax, tethered in places to the pleura by scar. The pneumothorax is on the order of 50-60% or so.  Airspace opacities deep in the left lower lobe consistent with contusion. Right lung clear without evidence of contusion.  No right pneumothorax.  Very small left  hemothorax.  Prior sternotomy for CABG.  Heart normal in size.  Focal bulge of the right ventricle anteriorly at the level of the outflow tract may be due to prior pericardial resection.  Bone window images demonstrate no visible fractures involving the bony thorax.  IMPRESSION:  1.  Large left pneumothorax on the order of 50-60% or so.  Very small associated left hemothorax. 2.  Contusion involving the left lower lobe. 3.  Pneumomediastinum.  The distribution of the gas bubbles along the mid and distal esophagus raises the question of esophageal injury.  CT ABDOMEN AND PELVIS  Findings:  Large hematoma in the subcutaneous tissues of the left flank measuring approximately 10.9 x 4.1 x 10.0 cm, with overlying ecchymosis in the subcutaneous tissues of the left flank. Fractures involving the left transverse processes of L2, L3, and L4.  Posterior dislocation of the right hip joint, with an associated displaced fracture involving the posterior wall of the acetabulum; there are intra-articular bone fragments as well. Sacroiliac joints and symphysis pubis intact without evidence of the diastasis.  No other fractures.  Normal appearing liver, spleen, pancreas, adrenal glands, and right kidney.  Approximate 1.5 cm simple cyst arising from the mid left kidney; no significant abnormalities involving the left kidney. Gallbladder unremarkable by CT.  No biliary ductal dilation.  Mild bilateral iliac atherosclerosis.  No significant lymphadenopathy.  Normal-appearing stomach and small bowel.  Descending and sigmoid colon diverticulosis without evidence of acute diverticulitis. Cecum extends low into the pelvis, and normal appearing short appendix in the right mid pelvis.  Urinary bladder unremarkable.  Small amount of retroperitoneal hemorrhage in the right side of the low pelvis. Normal-appearing prostate gland and seminal vesicles.  Note is made note made of what is either fluid or possibly undescended testes in both inguinal  canals.  IMPRESSION:  1.  No evidence of acute traumatic injury to the abdominal or pelvic viscera. 2.  Posterior dislocation of the right hip joint with an associated fracture involving the posterior wall of the acetabulum which is displaced.  There is an associated hip joint hemarthrosis and there are fracture fragments within the joint. 3.  Fractures involving the left transverse processes of L2, L3, and L4. 4.  Large hematoma in the subcutaneous tissues of the left flank, measured above.  There is overlying ecchymosis. 5.  Fluid in the inguinal canals bilaterally versus undescended testes. 6.  Scattered descending and sigmoid colon diverticula without evidence of acute diverticulitis. 7.  Small amount of retroperitoneal hemorrhage in the right side of the low pelvis.  Critical Value/emergent results were called by telephone at the time of interpretation on 09/05/2012 at 1555 hours to Dr. Preston Fleeting of the emergency department, who verbally acknowledged these results.   Original Report Authenticated By: Hulan Saas, M.D.    Ct Cervical Spine Wo Contrast  09/05/2012  *RADIOLOGY REPORT*  Clinical Data:  Larey Seat from a roof approximately 8 feet onto a wooden deck.  CT HEAD WITHOUT CONTRAST CT CERVICAL SPINE WITHOUT CONTRAST  Technique:  Multidetector CT imaging of the head and cervical spine was performed following the standard protocol without intravenous contrast.  Multiplanar CT image reconstructions of the cervical spine were also  generated.  Comparison:  None.  CT HEAD  Findings: Ventricular system normal in size and appearance for age. No mass lesion.  No midline shift.  No acute hemorrhage or hematoma.  No extra-axial fluid collections.  No evidence of acute infarction.  No focal brain parenchymal abnormalities.  No skull fractures or other focal osseous abnormalities involving the skull.  Visualized paranasal sinuses, mastoid air cells, and middle ear cavities well-aerated.  Note is made of bony nasal septal  deviation to the left.  IMPRESSION: Normal examination.  CT CERVICAL SPINE  Findings: No fractures identified involving the cervical spine. Sagittal reconstructed images demonstrate anatomic alignment. There is partial fusion of the posterior elements at C2-3 on the right which is likely congenital.  Facet joints intact throughout. No spinal stenosis.  Well-preserved disc spaces.  Coronal reformatted images demonstrate an intact craniocervical junction, intact C1-C2 articulation, intact dens, and intact lateral masses. No significant bony foraminal stenoses.  IMPRESSION:  1.  No cervical spine fractures identified. 2.  Partial fusion of the posterior elements of C2 and C3 on the right which is likely congenital.   Original Report Authenticated By: Hulan Saas, M.D.    Ct Pelvis Wo Contrast  09/05/2012  *RADIOLOGY REPORT*  Clinical Data: History of right hip fracture dislocation with reduction in emergency department.  The patient fell today  CT PELVIS WITHOUT CONTRAST  Technique:  Multidetector CT imaging of the pelvis was performed following the standard protocol without intravenous contrast.  Comparison: CT chest abdomen and pelvis 09/05/2012  Findings: The right femoral head has been reduced, and is now positioned within the right acetabulum.  There is a fracture of the posterior wall of the right acetabulum. The dominant curvilinear fracture fragment measures approximately 4.2 x 0.9 cm and is displaced posterolaterally up to approximately 2.2 cm.  Inferior to the dominant bony fragment there are 2 to 3 much smaller bony fragments laterally displaced.  Immediately adjacent to the posterior aspect of the acetabulum is a 7 x 4 mm bony fragment.  Within the anterior/lateral aspect of the hip joint is an 11 x 3 mm bony fragment.  This is best seen on coronal image number 52 and sagittal image number 43.  The imaged portion of the proximal right femur appears intact.  No donor sites or fracture lines are  visualized.  There is soft tissue fullness posterior to the right femoral head, likely reflecting bleeding related to the fracture dislocation.  No additional pelvic fracture is identified.  There is no diastasis.  The urinary bladder contains a urine-contrast level.  No evidence of bladder injury.  Again seen is some retroperitoneal hemorrhage in the right side of the inferior pelvis.  This is without significant change compared to the study performed earlier today.  Prostate gland within normal limits.  No dilated bowel loops.  IMPRESSION:  1.  Acute, displaced, mildly comminuted fracture of the posterior wall of the right acetabulum. 2.  There is a small sliver of bone within the right hip joint (coronal image number 52 and sagittal image 43). 3.  The right femoral head is located.  No evidence of acute bony injury to the proximal right femur. 4.  Small retroperitoneal hematoma in the right aspect of the posterior pelvis, without significant change.   Original Report Authenticated By: Britta Mccreedy, M.D.    Ct Abdomen Pelvis W Contrast  09/05/2012  *RADIOLOGY REPORT*  Clinical Data:  Larey Seat off of a roof approximately 8 feet onto a wooden deck.  CT CHEST, ABDOMEN AND PELVIS WITH CONTRAST  Technique:  Multidetector CT imaging of the chest, abdomen and pelvis was performed following the standard protocol during bolus administration of intravenous contrast.  Contrast: OMNIPAQUE IOHEXOL 300 MG/ML  SOLN  Comparison:   None.  CT CHEST  Findings:  No evidence of mediastinal hematoma.  No hemopericardium or hemomediastinum. Small amount of pneumomediastinum, with the gas distributed along the mid and distal esophagus.  Large left pneumothorax, tethered in places to the pleura by scar. The pneumothorax is on the order of 50-60% or so.  Airspace opacities deep in the left lower lobe consistent with contusion. Right lung clear without evidence of contusion.  No right pneumothorax.  Very small left hemothorax.  Prior  sternotomy for CABG.  Heart normal in size.  Focal bulge of the right ventricle anteriorly at the level of the outflow tract may be due to prior pericardial resection.  Bone window images demonstrate no visible fractures involving the bony thorax.  IMPRESSION:  1.  Large left pneumothorax on the order of 50-60% or so.  Very small associated left hemothorax. 2.  Contusion involving the left lower lobe. 3.  Pneumomediastinum.  The distribution of the gas bubbles along the mid and distal esophagus raises the question of esophageal injury.  CT ABDOMEN AND PELVIS  Findings:  Large hematoma in the subcutaneous tissues of the left flank measuring approximately 10.9 x 4.1 x 10.0 cm, with overlying ecchymosis in the subcutaneous tissues of the left flank. Fractures involving the left transverse processes of L2, L3, and L4.  Posterior dislocation of the right hip joint, with an associated displaced fracture involving the posterior wall of the acetabulum; there are intra-articular bone fragments as well. Sacroiliac joints and symphysis pubis intact without evidence of the diastasis.  No other fractures.  Normal appearing liver, spleen, pancreas, adrenal glands, and right kidney.  Approximate 1.5 cm simple cyst arising from the mid left kidney; no significant abnormalities involving the left kidney. Gallbladder unremarkable by CT.  No biliary ductal dilation.  Mild bilateral iliac atherosclerosis.  No significant lymphadenopathy.  Normal-appearing stomach and small bowel.  Descending and sigmoid colon diverticulosis without evidence of acute diverticulitis. Cecum extends low into the pelvis, and normal appearing short appendix in the right mid pelvis.  Urinary bladder unremarkable.  Small amount of retroperitoneal hemorrhage in the right side of the low pelvis. Normal-appearing prostate gland and seminal vesicles.  Note is made note made of what is either fluid or possibly undescended testes in both inguinal canals.   IMPRESSION:  1.  No evidence of acute traumatic injury to the abdominal or pelvic viscera. 2.  Posterior dislocation of the right hip joint with an associated fracture involving the posterior wall of the acetabulum which is displaced.  There is an associated hip joint hemarthrosis and there are fracture fragments within the joint. 3.  Fractures involving the left transverse processes of L2, L3, and L4. 4.  Large hematoma in the subcutaneous tissues of the left flank, measured above.  There is overlying ecchymosis. 5.  Fluid in the inguinal canals bilaterally versus undescended testes. 6.  Scattered descending and sigmoid colon diverticula without evidence of acute diverticulitis. 7.  Small amount of retroperitoneal hemorrhage in the right side of the low pelvis.  Critical Value/emergent results were called by telephone at the time of interpretation on 09/05/2012 at 1555 hours to Dr. Preston Fleeting of the emergency department, who verbally acknowledged these results.   Original Report Authenticated By: Maisie Fus  Lyman Bishop, M.D.    Dg Pelvis Portable  09/05/2012  *RADIOLOGY REPORT*  Clinical Data: Post reduction right hip dislocation.  PORTABLE AP PELVIS 09/05/2012 1644 hours:  Comparison: CT pelvis earlier same date 1452 hours.  Findings: Anatomic alignment of the right hip joint after reduction.  Right acetabular fracture again demonstrated.  No other visible pelvic fractures.  IMPRESSION: Anatomic alignment of the right hip joint post reduction.   Original Report Authenticated By: Hulan Saas, M.D.    Dg Chest Portable 1 View  09/05/2012  *RADIOLOGY REPORT*  Clinical Data: Traumatic left pneumothorax and status post chest tube placement.  PORTABLE CHEST - 1 VIEW  Comparison: CT of the chest earlier today.  Findings: A single left chest tube has been placed.  The lung has completely re-expanded with no further pneumothorax visualized.  No pulmonary consolidation or pleural effusion identified.  The heart size is  within normal limits.  IMPRESSION: Complete re-expansion of left lung after chest tube placement.   Original Report Authenticated By: Irish Lack, M.D.     Anti-infectives: Anti-infectives    None      Assessment/Plan: Active Problems:  Pneumothorax on left  Pneumomediastinum  Right acetabular fracture  Hip dislocation, right  Lumbar transverse process fracture Stable PTX  Chest tube in place  CXR shows stable expanded lung no air leak Rip acetabular fracture and dislocation   Per Handy  Hip in place To floor  DVT prophylaxsis Advance diet  LOS: 1 day    Lamiracle Chaidez A. 09/06/2012

## 2012-09-07 ENCOUNTER — Inpatient Hospital Stay (HOSPITAL_COMMUNITY): Payer: BC Managed Care – PPO

## 2012-09-07 DIAGNOSIS — D62 Acute posthemorrhagic anemia: Secondary | ICD-10-CM

## 2012-09-07 DIAGNOSIS — J45909 Unspecified asthma, uncomplicated: Secondary | ICD-10-CM | POA: Insufficient documentation

## 2012-09-07 DIAGNOSIS — W19XXXA Unspecified fall, initial encounter: Secondary | ICD-10-CM | POA: Diagnosis present

## 2012-09-07 LAB — BASIC METABOLIC PANEL
BUN: 8 mg/dL (ref 6–23)
CO2: 30 mEq/L (ref 19–32)
Calcium: 7.9 mg/dL — ABNORMAL LOW (ref 8.4–10.5)
Chloride: 104 mEq/L (ref 96–112)
Creatinine, Ser: 0.98 mg/dL (ref 0.50–1.35)
GFR calc Af Amer: >90 mL/min (ref >90)
GFR calc non Af Amer: >90 mL/min (ref >90)
Glucose, Bld: 107 mg/dL — ABNORMAL HIGH (ref 70–99)
Potassium: 4.5 mEq/L (ref 3.5–5.1)
Sodium: 137 mEq/L (ref 135–145)

## 2012-09-07 LAB — CBC
HCT: 24 % — ABNORMAL LOW (ref 39.0–52.0)
Hemoglobin: 8.3 g/dL — ABNORMAL LOW (ref 13.0–17.0)
MCH: 30.9 pg (ref 26.0–34.0)
MCHC: 34.6 g/dL (ref 30.0–36.0)
MCV: 89.2 fL (ref 78.0–100.0)
Platelets: 122 10*3/uL — ABNORMAL LOW (ref 150–400)
RBC: 2.69 MIL/uL — ABNORMAL LOW (ref 4.22–5.81)
RDW: 13.1 % (ref 11.5–15.5)
WBC: 7.1 10*3/uL (ref 4.0–10.5)

## 2012-09-07 LAB — PROTIME-INR
INR: 1.09 (ref 0.00–1.49)
Prothrombin Time: 14 seconds (ref 11.6–15.2)

## 2012-09-07 LAB — APTT: aPTT: 32 seconds (ref 24–37)

## 2012-09-07 MED ORDER — VANCOMYCIN HCL IN DEXTROSE 1-5 GM/200ML-% IV SOLN
1000.0000 mg | Freq: Once | INTRAVENOUS | Status: AC
  Administered 2012-09-08: 1000 mg via INTRAVENOUS
  Filled 2012-09-07: qty 200

## 2012-09-07 NOTE — Progress Notes (Signed)
Orthopedics Progress Note  Subjective: I am ready for surgery tomorrow.  Objective:  Filed Vitals:   09/07/12 1300  BP: 136/59  Pulse: 65  Temp: 98.9 F (37.2 C)  Resp: 18    General: Awake and alert  Musculoskeletal: Right leg in bucks traction Neurovascularly intact  Lab Results  Component Value Date   WBC 9.2 09/06/2012   HGB 9.2* 09/06/2012   HCT 25.9* 09/06/2012   MCV 87.8 09/06/2012   PLT 133* 09/06/2012       Component Value Date/Time   NA 134* 09/06/2012 0445   K 3.9 09/06/2012 0445   CL 102 09/06/2012 0445   CO2 27 09/06/2012 0445   GLUCOSE 119* 09/06/2012 0445   BUN 15 09/06/2012 0445   CREATININE 0.97 09/06/2012 0445   CALCIUM 7.4* 09/06/2012 0445   GFRNONAA >90 09/06/2012 0445   GFRAA >90 09/06/2012 0445    Lab Results  Component Value Date   INR 1.00 03/12/2011   INR 1.0 11/14/2008   INR 1.1 07/02/2007    Assessment/Plan: S/p fracture dislocation of the right hip Awaiting definitive fixation tomorrow per Dr [name]. Thank you.  Patient in good spirits. Dr [name] to assume his care at this point.  Almedia Balls. Ranell Patrick, MD 09/07/2012 3:14 PM

## 2012-09-07 NOTE — Consult Note (Signed)
Orthopaedic Trauma Service Consult  Reason for Consult: Right acetabulum fracture Referring Physician: Beverely Low, M.D.   HPI: Patient is a 51 year old Caucasian male who was up on a roof cleaning the gutters on 09/05/2012 when he fell through some lattice work. Patient does not recall exactly how he landed but he had a immediate onset of pain in his right hip and was unable to get up. Patient was brought to [facility] for evaluation where he was found to have a right hip fracture dislocation as well as a left pneumothorax and some lumbar transverse process fractures. Patient was seen initially by Dr. Ranell Patrick for his right hip dislocation. Closed reduction was performed and postreduction CT was obtained. This demonstrated a large posterior wall fragment to the right acetabulum. Given the complexity of the injury it was felt that the trauma service and orthopedic traumatologist was needed for a definitive treatment. Currently the patient is in 5 N. room 9. He is fairly comfortable at current time. Denies any numbness or tingling. His right leg is comfortable. He is not in any traction on but that has been in a knee immobilizer to prevent him from getting into the provocative position to re\re dislocate his hip. Patient denies any chest pain other than left chest wall tenderness. Denies any shortness of breath. No palpitations or other cardiovascular concerns. Denies any abdominal pain. Does note some low back pain presumably from his transverse process fractures.  The patient has essentially been on bedrest since admission. His much. Pain in his right hip is exacerbated with movement and is relieved with rest and pain medication. Again it is primarily located about his right hip. He does also complain of some left shoulder discomfort as well. But is able to move his left arm. And is without any neurologic disturbances to the left upper extremity.   Past Medical History  Diagnosis Date  . Left  inguinal hernia   . Right inguinal hernia   . Hypertension   . Hyperlipidemia   . Asthma   . Allergy   . Postsurgical aortocoronary bypass status   . CAD (coronary artery disease)     Past Surgical History  Procedure Date  . Double coronary artery bypass   . Hernia repair     left and right inguinal    Family History  Problem Relation Age of Onset  . Hypertension Mother   . Hyperlipidemia Mother   . Hypertension Father   . Hyperlipidemia Father     Social History:  reports that he has never smoked. He does not have any smokeless tobacco history on file. He reports that he does not drink alcohol or use illicit drugs.  Allergies:  Allergies  Allergen Reactions  . Cefaclor     REACTION: rash    Medications:  Prior to Admission:  Prescriptions prior to admission  Medication Sig Dispense Refill  . acetaminophen (TYLENOL) 500 MG tablet Take 500 mg by mouth every 6 (six) hours as needed. For pain/fever      . aspirin 325 MG tablet Take 325 mg by mouth daily.        Marland Kitchen atorvastatin (LIPITOR) 20 MG tablet Take 1 tablet (20 mg total) by mouth daily.  90 tablet  3  . cetirizine (ZYRTEC) 10 MG tablet Take 10 mg by mouth daily.      . Melatonin 3 MG TABS Take 1 tablet by mouth at bedtime as needed. For sleep       Scheduled:   .  atorvastatin  20 mg Oral q1800  . docusate sodium  100 mg Oral BID  . enoxaparin  40 mg Subcutaneous Q24H  . HYDROmorphone PCA 0.3 mg/mL   Intravenous Q4H  . [DISCONTINUED] pantoprazole  40 mg Oral Daily  . [DISCONTINUED] pantoprazole (PROTONIX) IV  40 mg Intravenous Daily   Continuous:   . dextrose 5 % and 0.45 % NaCl with KCl 20 mEq/L 75 mL/hr at 09/06/12 2303   WUJ:[id number], ondansetron (ZOFRAN) IV, ondansetron (ZOFRAN) IV, ondansetron, [DISCONTINUED] diphenhydrAMINE, [DISCONTINUED] diphenhydrAMINE, [DISCONTINUED] naloxone, [DISCONTINUED] oxyCODONE, [DISCONTINUED] oxyCODONE, [DISCONTINUED] sodium chloride, [DISCONTINUED] traMADol  Results  for orders placed during the hospital encounter of 09/05/12 (from the past 48 hour(s))  CBC WITH DIFFERENTIAL     Status: Abnormal   Collection Time   09/05/12  1:50 PM      Component Value Range Comment   WBC 23.2 (*) 4.0 - 10.5 K/uL    RBC 4.18 (*) 4.22 - 5.81 MIL/uL    Hemoglobin 12.7 (*) 13.0 - 17.0 g/dL    HCT 13.0 (*) 86.5 - 52.0 %    MCV 87.6  78.0 - 100.0 fL    MCH 30.4  26.0 - 34.0 pg    MCHC 34.7  30.0 - 36.0 g/dL    RDW 78.4  69.6 - 29.5 %    Platelets 198  150 - 400 K/uL    Neutrophils Relative 88 (*) 43 - 77 %    Lymphocytes Relative 5 (*) 12 - 46 %    Monocytes Relative 7  3 - 12 %    Eosinophils Relative 0  0 - 5 %    Basophils Relative 0  0 - 1 %    Neutro Abs 20.4 (*) 1.7 - 7.7 K/uL    Lymphs Abs 1.2  0.7 - 4.0 K/uL    Monocytes Absolute 1.6 (*) 0.1 - 1.0 K/uL    Eosinophils Absolute 0.0  0.0 - 0.7 K/uL    Basophils Absolute 0.0  0.0 - 0.1 K/uL    Smear Review MORPHOLOGY UNREMARKABLE     COMPREHENSIVE METABOLIC PANEL     Status: Abnormal   Collection Time   09/05/12  1:50 PM      Component Value Range Comment   Sodium 137  135 - 145 mEq/L    Potassium 3.3 (*) 3.5 - 5.1 mEq/L    Chloride 102  96 - 112 mEq/L    CO2 22  19 - 32 mEq/L    Glucose, Bld 116 (*) 70 - 99 mg/dL    BUN 22  6 - 23 mg/dL    Creatinine, Ser 2.84  0.50 - 1.35 mg/dL    Calcium 8.5  8.4 - 13.2 mg/dL    Total Protein 5.9 (*) 6.0 - 8.3 g/dL    Albumin 3.5  3.5 - 5.2 g/dL    AST 34  0 - 37 U/L    ALT 24  0 - 53 U/L    Alkaline Phosphatase 63  39 - 117 U/L    Total Bilirubin 0.6  0.3 - 1.2 mg/dL    GFR calc non Af Amer >90  >90 mL/min    GFR calc Af Amer >90  >90 mL/min   LIPASE, BLOOD     Status: Normal   Collection Time   09/05/12  1:50 PM      Component Value Range Comment   Lipase 34  11 - 59 U/L   CG4 I-STAT (LACTIC ACID)     Status:  Abnormal   Collection Time   09/05/12  2:05 PM      Component Value Range Comment   Lactic Acid, Venous 2.71 (*) 0.5 - 2.2 mmol/L   URINALYSIS,  ROUTINE W REFLEX MICROSCOPIC     Status: Abnormal   Collection Time   09/05/12  4:11 PM      Component Value Range Comment   Color, Urine YELLOW  YELLOW    APPearance CLEAR  CLEAR    Specific Gravity, Urine 1.033 (*) 1.005 - 1.030    pH 5.5  5.0 - 8.0    Glucose, UA NEGATIVE  NEGATIVE mg/dL    Hgb urine dipstick LARGE (*) NEGATIVE    Bilirubin Urine NEGATIVE  NEGATIVE    Ketones, ur 15 (*) NEGATIVE mg/dL    Protein, ur NEGATIVE  NEGATIVE mg/dL    Urobilinogen, UA 0.2  0.0 - 1.0 mg/dL    Nitrite NEGATIVE  NEGATIVE    Leukocytes, UA NEGATIVE  NEGATIVE   URINE MICROSCOPIC-ADD ON     Status: Normal   Collection Time   09/05/12  4:11 PM      Component Value Range Comment   Squamous Epithelial / LPF RARE  RARE    WBC, UA 0-2  <3 WBC/hpf    RBC / HPF 11-20  <3 RBC/hpf   MRSA PCR SCREENING     Status: Normal   Collection Time   09/05/12  7:31 PM      Component Value Range Comment   MRSA by PCR NEGATIVE  NEGATIVE   CBC     Status: Abnormal   Collection Time   09/06/12  4:45 AM      Component Value Range Comment   WBC 9.2  4.0 - 10.5 K/uL    RBC 2.95 (*) 4.22 - 5.81 MIL/uL    Hemoglobin 9.2 (*) 13.0 - 17.0 g/dL DELTA CHECK NOTED   HCT 25.9 (*) 39.0 - 52.0 %    MCV 87.8  78.0 - 100.0 fL    MCH 31.2  26.0 - 34.0 pg    MCHC 35.5  30.0 - 36.0 g/dL    RDW 16.1  09.6 - 04.5 %    Platelets 133 (*) 150 - 400 K/uL DELTA CHECK NOTED  BASIC METABOLIC PANEL     Status: Abnormal   Collection Time   09/06/12  4:45 AM      Component Value Range Comment   Sodium 134 (*) 135 - 145 mEq/L    Potassium 3.9  3.5 - 5.1 mEq/L    Chloride 102  96 - 112 mEq/L    CO2 27  19 - 32 mEq/L    Glucose, Bld 119 (*) 70 - 99 mg/dL    BUN 15  6 - 23 mg/dL    Creatinine, Ser 4.09  0.50 - 1.35 mg/dL    Calcium 7.4 (*) 8.4 - 10.5 mg/dL    GFR calc non Af Amer >90  >90 mL/min    GFR calc Af Amer >90  >90 mL/min     Ct Head Wo Contrast  09/05/2012  *RADIOLOGY REPORT*  Clinical Data:  Larey Seat from a roof  approximately 8 feet onto a wooden deck.  CT HEAD WITHOUT CONTRAST CT CERVICAL SPINE WITHOUT CONTRAST  Technique:  Multidetector CT imaging of the head and cervical spine was performed following the standard protocol without intravenous contrast.  Multiplanar CT image reconstructions of the cervical spine were also generated.  Comparison:  None.  CT HEAD  Findings:  Ventricular system normal in size and appearance for age. No mass lesion.  No midline shift.  No acute hemorrhage or hematoma.  No extra-axial fluid collections.  No evidence of acute infarction.  No focal brain parenchymal abnormalities.  No skull fractures or other focal osseous abnormalities involving the skull.  Visualized paranasal sinuses, mastoid air cells, and middle ear cavities well-aerated.  Note is made of bony nasal septal deviation to the left.  IMPRESSION: Normal examination.  CT CERVICAL SPINE  Findings: No fractures identified involving the cervical spine. Sagittal reconstructed images demonstrate anatomic alignment. There is partial fusion of the posterior elements at C2-3 on the right which is likely congenital.  Facet joints intact throughout. No spinal stenosis.  Well-preserved disc spaces.  Coronal reformatted images demonstrate an intact craniocervical junction, intact C1-C2 articulation, intact dens, and intact lateral masses. No significant bony foraminal stenoses.  IMPRESSION:  1.  No cervical spine fractures identified. 2.  Partial fusion of the posterior elements of C2 and C3 on the right which is likely congenital.   Original Report Authenticated By: Hulan Saas, M.D.    Ct Chest W Contrast  09/05/2012  *RADIOLOGY REPORT*  Clinical Data:  Larey Seat off of a roof approximately 8 feet onto a wooden deck.  CT CHEST, ABDOMEN AND PELVIS WITH CONTRAST  Technique:  Multidetector CT imaging of the chest, abdomen and pelvis was performed following the standard protocol during bolus administration of intravenous contrast.  Contrast:  OMNIPAQUE IOHEXOL 300 MG/ML  SOLN  Comparison:   None.  CT CHEST  Findings:  No evidence of mediastinal hematoma.  No hemopericardium or hemomediastinum. Small amount of pneumomediastinum, with the gas distributed along the mid and distal esophagus.  Large left pneumothorax, tethered in places to the pleura by scar. The pneumothorax is on the order of 50-60% or so.  Airspace opacities deep in the left lower lobe consistent with contusion. Right lung clear without evidence of contusion.  No right pneumothorax.  Very small left hemothorax.  Prior sternotomy for CABG.  Heart normal in size.  Focal bulge of the right ventricle anteriorly at the level of the outflow tract may be due to prior pericardial resection.  Bone window images demonstrate no visible fractures involving the bony thorax.  IMPRESSION:  1.  Large left pneumothorax on the order of 50-60% or so.  Very small associated left hemothorax. 2.  Contusion involving the left lower lobe. 3.  Pneumomediastinum.  The distribution of the gas bubbles along the mid and distal esophagus raises the question of esophageal injury.  CT ABDOMEN AND PELVIS  Findings:  Large hematoma in the subcutaneous tissues of the left flank measuring approximately 10.9 x 4.1 x 10.0 cm, with overlying ecchymosis in the subcutaneous tissues of the left flank. Fractures involving the left transverse processes of L2, L3, and L4.  Posterior dislocation of the right hip joint, with an associated displaced fracture involving the posterior wall of the acetabulum; there are intra-articular bone fragments as well. Sacroiliac joints and symphysis pubis intact without evidence of the diastasis.  No other fractures.  Normal appearing liver, spleen, pancreas, adrenal glands, and right kidney.  Approximate 1.5 cm simple cyst arising from the mid left kidney; no significant abnormalities involving the left kidney. Gallbladder unremarkable by CT.  No biliary ductal dilation.  Mild bilateral iliac  atherosclerosis.  No significant lymphadenopathy.  Normal-appearing stomach and small bowel.  Descending and sigmoid colon diverticulosis without evidence of acute diverticulitis. Cecum extends low into the pelvis,  and normal appearing short appendix in the right mid pelvis.  Urinary bladder unremarkable.  Small amount of retroperitoneal hemorrhage in the right side of the low pelvis. Normal-appearing prostate gland and seminal vesicles.  Note is made note made of what is either fluid or possibly undescended testes in both inguinal canals.  IMPRESSION:  1.  No evidence of acute traumatic injury to the abdominal or pelvic viscera. 2.  Posterior dislocation of the right hip joint with an associated fracture involving the posterior wall of the acetabulum which is displaced.  There is an associated hip joint hemarthrosis and there are fracture fragments within the joint. 3.  Fractures involving the left transverse processes of L2, L3, and L4. 4.  Large hematoma in the subcutaneous tissues of the left flank, measured above.  There is overlying ecchymosis. 5.  Fluid in the inguinal canals bilaterally versus undescended testes. 6.  Scattered descending and sigmoid colon diverticula without evidence of acute diverticulitis. 7.  Small amount of retroperitoneal hemorrhage in the right side of the low pelvis.  Critical Value/emergent results were called by telephone at the time of interpretation on 09/05/2012 at 1555 hours to Dr. Preston Fleeting of the emergency department, who verbally acknowledged these results.   Original Report Authenticated By: Hulan Saas, M.D.    Ct Cervical Spine Wo Contrast  09/05/2012  *RADIOLOGY REPORT*  Clinical Data:  Larey Seat from a roof approximately 8 feet onto a wooden deck.  CT HEAD WITHOUT CONTRAST CT CERVICAL SPINE WITHOUT CONTRAST  Technique:  Multidetector CT imaging of the head and cervical spine was performed following the standard protocol without intravenous contrast.  Multiplanar CT image  reconstructions of the cervical spine were also generated.  Comparison:  None.  CT HEAD  Findings: Ventricular system normal in size and appearance for age. No mass lesion.  No midline shift.  No acute hemorrhage or hematoma.  No extra-axial fluid collections.  No evidence of acute infarction.  No focal brain parenchymal abnormalities.  No skull fractures or other focal osseous abnormalities involving the skull.  Visualized paranasal sinuses, mastoid air cells, and middle ear cavities well-aerated.  Note is made of bony nasal septal deviation to the left.  IMPRESSION: Normal examination.  CT CERVICAL SPINE  Findings: No fractures identified involving the cervical spine. Sagittal reconstructed images demonstrate anatomic alignment. There is partial fusion of the posterior elements at C2-3 on the right which is likely congenital.  Facet joints intact throughout. No spinal stenosis.  Well-preserved disc spaces.  Coronal reformatted images demonstrate an intact craniocervical junction, intact C1-C2 articulation, intact dens, and intact lateral masses. No significant bony foraminal stenoses.  IMPRESSION:  1.  No cervical spine fractures identified. 2.  Partial fusion of the posterior elements of C2 and C3 on the right which is likely congenital.   Original Report Authenticated By: Hulan Saas, M.D.    Ct Pelvis Wo Contrast  09/05/2012  *RADIOLOGY REPORT*  Clinical Data: History of right hip fracture dislocation with reduction in emergency department.  The patient fell today  CT PELVIS WITHOUT CONTRAST  Technique:  Multidetector CT imaging of the pelvis was performed following the standard protocol without intravenous contrast.  Comparison: CT chest abdomen and pelvis 09/05/2012  Findings: The right femoral head has been reduced, and is now positioned within the right acetabulum.  There is a fracture of the posterior wall of the right acetabulum. The dominant curvilinear fracture fragment measures approximately  4.2 x 0.9 cm and is displaced posterolaterally up to approximately 2.2  cm.  Inferior to the dominant bony fragment there are 2 to 3 much smaller bony fragments laterally displaced.  Immediately adjacent to the posterior aspect of the acetabulum is a 7 x 4 mm bony fragment.  Within the anterior/lateral aspect of the hip joint is an 11 x 3 mm bony fragment.  This is best seen on coronal image number 52 and sagittal image number 43.  The imaged portion of the proximal right femur appears intact.  No donor sites or fracture lines are visualized.  There is soft tissue fullness posterior to the right femoral head, likely reflecting bleeding related to the fracture dislocation.  No additional pelvic fracture is identified.  There is no diastasis.  The urinary bladder contains a urine-contrast level.  No evidence of bladder injury.  Again seen is some retroperitoneal hemorrhage in the right side of the inferior pelvis.  This is without significant change compared to the study performed earlier today.  Prostate gland within normal limits.  No dilated bowel loops.  IMPRESSION:  1.  Acute, displaced, mildly comminuted fracture of the posterior wall of the right acetabulum. 2.  There is a small sliver of bone within the right hip joint (coronal image number 52 and sagittal image 43). 3.  The right femoral head is located.  No evidence of acute bony injury to the proximal right femur. 4.  Small retroperitoneal hematoma in the right aspect of the posterior pelvis, without significant change.   Original Report Authenticated By: Britta Mccreedy, M.D.    Ct Abdomen Pelvis W Contrast  09/05/2012  *RADIOLOGY REPORT*  Clinical Data:  Larey Seat off of a roof approximately 8 feet onto a wooden deck.  CT CHEST, ABDOMEN AND PELVIS WITH CONTRAST  Technique:  Multidetector CT imaging of the chest, abdomen and pelvis was performed following the standard protocol during bolus administration of intravenous contrast.  Contrast: OMNIPAQUE  IOHEXOL 300 MG/ML  SOLN  Comparison:   None.  CT CHEST  Findings:  No evidence of mediastinal hematoma.  No hemopericardium or hemomediastinum. Small amount of pneumomediastinum, with the gas distributed along the mid and distal esophagus.  Large left pneumothorax, tethered in places to the pleura by scar. The pneumothorax is on the order of 50-60% or so.  Airspace opacities deep in the left lower lobe consistent with contusion. Right lung clear without evidence of contusion.  No right pneumothorax.  Very small left hemothorax.  Prior sternotomy for CABG.  Heart normal in size.  Focal bulge of the right ventricle anteriorly at the level of the outflow tract may be due to prior pericardial resection.  Bone window images demonstrate no visible fractures involving the bony thorax.  IMPRESSION:  1.  Large left pneumothorax on the order of 50-60% or so.  Very small associated left hemothorax. 2.  Contusion involving the left lower lobe. 3.  Pneumomediastinum.  The distribution of the gas bubbles along the mid and distal esophagus raises the question of esophageal injury.  CT ABDOMEN AND PELVIS  Findings:  Large hematoma in the subcutaneous tissues of the left flank measuring approximately 10.9 x 4.1 x 10.0 cm, with overlying ecchymosis in the subcutaneous tissues of the left flank. Fractures involving the left transverse processes of L2, L3, and L4.  Posterior dislocation of the right hip joint, with an associated displaced fracture involving the posterior wall of the acetabulum; there are intra-articular bone fragments as well. Sacroiliac joints and symphysis pubis intact without evidence of the diastasis.  No other fractures.  Normal appearing liver, spleen, pancreas, adrenal glands, and right kidney.  Approximate 1.5 cm simple cyst arising from the mid left kidney; no significant abnormalities involving the left kidney. Gallbladder unremarkable by CT.  No biliary ductal dilation.  Mild bilateral iliac atherosclerosis.   No significant lymphadenopathy.  Normal-appearing stomach and small bowel.  Descending and sigmoid colon diverticulosis without evidence of acute diverticulitis. Cecum extends low into the pelvis, and normal appearing short appendix in the right mid pelvis.  Urinary bladder unremarkable.  Small amount of retroperitoneal hemorrhage in the right side of the low pelvis. Normal-appearing prostate gland and seminal vesicles.  Note is made note made of what is either fluid or possibly undescended testes in both inguinal canals.  IMPRESSION:  1.  No evidence of acute traumatic injury to the abdominal or pelvic viscera. 2.  Posterior dislocation of the right hip joint with an associated fracture involving the posterior wall of the acetabulum which is displaced.  There is an associated hip joint hemarthrosis and there are fracture fragments within the joint. 3.  Fractures involving the left transverse processes of L2, L3, and L4. 4.  Large hematoma in the subcutaneous tissues of the left flank, measured above.  There is overlying ecchymosis. 5.  Fluid in the inguinal canals bilaterally versus undescended testes. 6.  Scattered descending and sigmoid colon diverticula without evidence of acute diverticulitis. 7.  Small amount of retroperitoneal hemorrhage in the right side of the low pelvis.  Critical Value/emergent results were called by telephone at the time of interpretation on 09/05/2012 at 1555 hours to Dr. Preston Fleeting of the emergency department, who verbally acknowledged these results.   Original Report Authenticated By: Hulan Saas, M.D.    Dg Pelvis Portable  09/05/2012  *RADIOLOGY REPORT*  Clinical Data: Post reduction right hip dislocation.  PORTABLE AP PELVIS 09/05/2012 1644 hours:  Comparison: CT pelvis earlier same date 1452 hours.  Findings: Anatomic alignment of the right hip joint after reduction.  Right acetabular fracture again demonstrated.  No other visible pelvic fractures.  IMPRESSION: Anatomic  alignment of the right hip joint post reduction.   Original Report Authenticated By: Hulan Saas, M.D.    Dg Chest Port 1 View  09/07/2012  *RADIOLOGY REPORT*  Clinical Data: Pneumothorax, left chest tube  PORTABLE CHEST - 1 VIEW  Comparison: Prior chest x-ray yesterday, 09/06/2012  Findings: Unchanged position of left-sided thoracostomy tube.  No visible pneumothorax.  Status post median sternotomy with evidence of multivessel CABG including LIMA bypass.  Unchanged cardiomegaly. The lungs are clear.  No acute osseous abnormality.  IMPRESSION:  1.  Stable appearance of left thoracostomy tube without a visible pneumothorax. 2.  Stable cardiomegaly.   Original Report Authenticated By: Malachy Moan, M.D.    Dg Chest Port 1 View  09/06/2012  *RADIOLOGY REPORT*  Clinical Data: Pneumothorax  PORTABLE CHEST - 1 VIEW  Comparison: 09/05/2012 chest radiograph and chest CT 09/05/2012  Findings: Left-sided chest tube is stable position.  The left lung is well expanded.  No residual pneumothorax is visualized.  Both lungs are clear.  There are changes of median sternotomy for CABG. Borderline cardiomegaly is stable.  IMPRESSION: The left chest tube and stable position.  No visible pneumothorax. The lungs are clear.   Original Report Authenticated By: Britta Mccreedy, M.D.    Dg Chest Portable 1 View  09/05/2012  *RADIOLOGY REPORT*  Clinical Data: Traumatic left pneumothorax and status post chest tube placement.  PORTABLE CHEST - 1 VIEW  Comparison: CT of the  chest earlier today.  Findings: A single left chest tube has been placed.  The lung has completely re-expanded with no further pneumothorax visualized.  No pulmonary consolidation or pleural effusion identified.  The heart size is within normal limits.  IMPRESSION: Complete re-expansion of left lung after chest tube placement.   Original Report Authenticated By: Irish Lack, M.D.     Review of Systems  Constitutional: Negative for fever and chills.    Eyes: Negative for blurred vision.  Respiratory: Negative for shortness of breath.   Cardiovascular:       L chest wall pain  Gastrointestinal: Negative for nausea, vomiting and abdominal pain.  Musculoskeletal:       Low back pain Right hip pain L shoulder pain  Neurological: Negative for tingling and sensory change.   Blood pressure 125/52, pulse 69, temperature 98.3 F (36.8 C), temperature source Oral, resp. rate 17, height 5\' 9"  (1.753 m), weight 74.707 kg (164 lb 11.2 oz), SpO2 100.00%. Physical Exam  Constitutional: He is oriented to person, place, and time. Vital signs are normal. He appears well-developed and well-nourished. He is cooperative. No distress.  HENT:  Head: Normocephalic and atraumatic.  Eyes: EOM are normal.  Neck: Normal range of motion. No spinous process tenderness and no muscular tenderness present. Normal range of motion present.  Cardiovascular:       s1 and s2, reg  Respiratory:       clear  GI:       + BS, NT  Musculoskeletal:       Right upper extremity: Unremarkable. No acute findings motor and sensory functions are intact. Palpable radial pulses noted.  Left upper extremity: Nontender palpation the clavicle. No tenderness palpation of the elbow, forearm, wrist, hand.    Some discomfort with palpation over his proximal humerus. Can passively range his left shoulder without difficulty but does have some pain with doing so.    Motor and sensory functions are grossly intact   Palpable radial pulse   Extremity is warm  Pelvis: No instability appreciated             Significant ecchymosis noted to the left flank             No appreciable soft tissue injury to the right flank or right pelvis area.       Left lower extremity: No acute findings motor and sensory functions are intact palpable pulses noted   ROM of hip knee and ankle are intact  Right lower extremity   Swelling is stable Extremity is warm with palpable dorsalis pedis pulse Deep  peroneal nerve, superficial peroneal nerve, tibial nerve sensory functions are grossly intact EHL, FHL, anterior tibialis, posterior tibialis, peroneals and gastrocsoleus complex motor function are grossly intact Compartments are soft and nontender Knee, tibia, ankle, foot are without acute findings Tenderness with palpation to the right hip.   Leg lenghts are appropriate and equal    Neurological: He is alert and oriented to person, place, and time. No sensory deficit.  Psychiatric: He has a normal mood and affect. His speech is normal and behavior is normal. Judgment and thought content normal. Cognition and memory are normal.    Assessment/Plan:  51 year old male status post fall from height  1. Fall 2. Right posterior wall acetabular fracture dislocation, OTA 62-A1, 30-A2  There is a large posterior wall fragment that will require open reduction internal fixation to restore alignment and stability to the acetabulum. Patient has eaten today so we plan  on proceeding to the OR tomorrow for formal ORIF.  There is also a rather sizable fragment in the anterior joint space which will need to be removed as well to prevent third body wear on the native joint.  Patient can continue bedrest for now we will have him placed into 10 pounds of Buck's traction for comfort while we wait for surgery tomorrow  Patient is uncomfortable in traction this can be removed.  Patient will be touchdown weightbearing with posterior hip precautions for 8 weeks after surgery. With graduated weightbearing thereafter and will have hip precautions in place for 3 months after surgery as well.  I would anticipate that we will  drill the femoral head to evaluate the vascular supply  Intraoperatively as well.   I will hold on Lovenox today    Given the presence of dislocation patient is also at risk for the development of heterotopic ossification I. we will arrange for the patient to go over to radiation oncology  department it was a long after surgery is completed hopefully within 48 hours for one time a radiation prophylaxis.  3. left pneumothorax  Per trauma service  I will check a chest x-ray in PACU after surgery tomorrow  4. left shoulder pain   check films likely a.c. sprain or muscular contusion 5. DVT and PE prophylaxis  Lovenox on hold today  Continue SCDs  Will start Coumadin after surgery 6. Pain   continue with current regimen 7. FEN  N.p.o. after midnight  8. Disposition  OR tomorrow for plate osteosynthesis of right posterior wall    Mearl Latin, PA-C [facility] [phone] (P) 09/07/2012, 9:27 AM

## 2012-09-07 NOTE — Progress Notes (Signed)
Orthopedic Tech Progress Note Patient Details:  [name] Jan 28, 1961 [id number]  Musculoskeletal Traction Type of Traction: Bucks Skin Traction Traction Weight: 10 lbs    [name] 09/07/2012, 10:39 AM

## 2012-09-07 NOTE — Progress Notes (Signed)
Patient ID: [name], male   DOB: 12-Aug-1961, 51 y.o.   MRN: [id number]   LOS: 2 days   Subjective: Doing better today, pain controlled. He tells me [name] plans to do surgery tomorrow.  Objective: Vital signs in last 24 hours: Temp:  [98 F (36.7 C)-98.4 F (36.9 C)] 98.3 F (36.8 C) (12/02 0527) Pulse Rate:  [56-69] 69  (12/02 0527) Resp:  [13-20] 17  (12/02 0527) BP: (98-133)/(48-79) 125/52 mmHg (12/02 0527) SpO2:  [93 %-100 %] 100 % (12/02 0527) Weight:  [164 lb 11.2 oz (74.707 kg)] 164 lb 11.2 oz (74.707 kg) (12/01 1000) Last BM Date: 09/04/12   IS:   Radiology PORTABLE CHEST - 1 VIEW  Comparison: Prior chest x-ray yesterday, 09/06/2012  Findings: Unchanged position of left-sided thoracostomy tube. No  visible pneumothorax. Status post median sternotomy with evidence  of multivessel CABG including LIMA bypass. Unchanged cardiomegaly.  The lungs are clear. No acute osseous abnormality.  IMPRESSION:  1. Stable appearance of left thoracostomy tube without a visible  pneumothorax.  2. Stable cardiomegaly.  Original Report Authenticated By: Malachy Moan, M.D.   General appearance: alert and no distress Resp: clear to auscultation bilaterally Cardio: regular rate and rhythm GI: normal findings: bowel sounds normal and soft, non-tender Extremities: NVI   Assessment/Plan: Fall Left PTX s/p CT -- To water seal. Plan to remove tomorrow after surgery if CXR ok. Will check in am before surgery as well. Right acet fx/dislocation -- For OR tomorrow. Mult lumbar TVP fxs Mult med probs -- Home meds FEN -- Cont PCA with OR tomorrow VTE -- Lovenox, SCD's Dispo -- PT/OT following surgery.   [name], PA-C Pager: [phone] General Trauma PA Pager: [phone]   09/07/2012

## 2012-09-07 NOTE — Progress Notes (Signed)
Patient is stable..  No airleak.  Likely remove CT tomorrow after surgery.   This patient has been seen and I agree with the findings and treatment plan.  Marta Lamas. Gae Bon, MD, FACS [phone] (pager) [phone] (direct pager) Trauma Surgeon

## 2012-09-08 ENCOUNTER — Inpatient Hospital Stay (HOSPITAL_COMMUNITY): Payer: BC Managed Care – PPO

## 2012-09-08 ENCOUNTER — Encounter (HOSPITAL_COMMUNITY): Payer: Self-pay | Admitting: Anesthesiology

## 2012-09-08 ENCOUNTER — Inpatient Hospital Stay (HOSPITAL_COMMUNITY): Payer: BC Managed Care – PPO | Admitting: Anesthesiology

## 2012-09-08 ENCOUNTER — Encounter (HOSPITAL_COMMUNITY): Admission: EM | Disposition: A | Discharge status: Rehabilitation Facility | Payer: Self-pay | Source: Home / Self Care

## 2012-09-08 DIAGNOSIS — R609 Edema, unspecified: Secondary | ICD-10-CM

## 2012-09-08 HISTORY — PX: ORIF ACETABULAR FRACTURE: SHX5029

## 2012-09-08 LAB — CBC
HCT: 26.2 % — ABNORMAL LOW (ref 39.0–52.0)
Hemoglobin: 9.1 g/dL — ABNORMAL LOW (ref 13.0–17.0)
MCH: 31 pg (ref 26.0–34.0)
MCHC: 34.7 g/dL (ref 30.0–36.0)
MCV: 89.1 fL (ref 78.0–100.0)
Platelets: 136 10*3/uL — ABNORMAL LOW (ref 150–400)
RBC: 2.94 MIL/uL — ABNORMAL LOW (ref 4.22–5.81)
RDW: 13.1 % (ref 11.5–15.5)
WBC: 8.2 10*3/uL (ref 4.0–10.5)

## 2012-09-08 LAB — SURGICAL PCR SCREEN
MRSA, PCR: NEGATIVE
Staphylococcus aureus: POSITIVE — AB

## 2012-09-08 LAB — BASIC METABOLIC PANEL
BUN: 10 mg/dL (ref 6–23)
CO2: 29 mEq/L (ref 19–32)
Calcium: 8.1 mg/dL — ABNORMAL LOW (ref 8.4–10.5)
Chloride: 104 mEq/L (ref 96–112)
Creatinine, Ser: 0.83 mg/dL (ref 0.50–1.35)
GFR calc Af Amer: >90 mL/min (ref >90)
GFR calc non Af Amer: >90 mL/min (ref >90)
Glucose, Bld: 105 mg/dL — ABNORMAL HIGH (ref 70–99)
Potassium: 4.3 mEq/L (ref 3.5–5.1)
Sodium: 137 mEq/L (ref 135–145)

## 2012-09-08 LAB — POCT I-STAT 4, (NA,K, GLUC, HGB,HCT)
Glucose, Bld: 102 mg/dL — ABNORMAL HIGH (ref 70–99)
Glucose, Bld: 119 mg/dL — ABNORMAL HIGH (ref 70–99)
HCT: 19 % — ABNORMAL LOW (ref 39.0–52.0)
HCT: 23 % — ABNORMAL LOW (ref 39.0–52.0)
Hemoglobin: 6.5 g/dL — CL (ref 13.0–17.0)
Hemoglobin: 7.8 g/dL — ABNORMAL LOW (ref 13.0–17.0)
Potassium: 4.2 mEq/L (ref 3.5–5.1)
Potassium: 4.4 mEq/L (ref 3.5–5.1)
Sodium: 137 mEq/L (ref 135–145)
Sodium: 136 mEq/L (ref 135–145)

## 2012-09-08 SURGERY — OPEN REDUCTION INTERNAL FIXATION (ORIF) ACETABULAR FRACTURE
Anesthesia: General | Site: Hip | Laterality: Right | Wound class: Clean

## 2012-09-08 MED ORDER — LACTATED RINGERS IV SOLN
INTRAVENOUS | Status: DC | PRN
  Administered 2012-09-08 (×2): via INTRAVENOUS

## 2012-09-08 MED ORDER — MEPERIDINE HCL 25 MG/ML IJ SOLN: 6.2500 mg | INTRAMUSCULAR | Status: DC | PRN

## 2012-09-08 MED ORDER — SODIUM CHLORIDE 0.9 % IR SOLN
Status: DC | PRN
  Administered 2012-09-08: 3000 mL

## 2012-09-08 MED ORDER — FENTANYL CITRATE 0.05 MG/ML IJ SOLN
INTRAMUSCULAR | Status: AC
  Administered 2012-09-08: 50 ug via INTRAVENOUS
  Filled 2012-09-08: qty 2

## 2012-09-08 MED ORDER — FENTANYL CITRATE 0.05 MG/ML IJ SOLN
25.0000 ug | INTRAMUSCULAR | Status: DC | PRN
  Administered 2012-09-08 (×3): 50 ug via INTRAVENOUS

## 2012-09-08 MED ORDER — NEOSTIGMINE METHYLSULFATE 1 MG/ML IJ SOLN
INTRAMUSCULAR | Status: DC | PRN
  Administered 2012-09-08: 3 mg via INTRAVENOUS

## 2012-09-08 MED ORDER — ENOXAPARIN SODIUM 40 MG/0.4ML ~~LOC~~ SOLN
40.0000 mg | SUBCUTANEOUS | Status: DC
  Administered 2012-09-08 – 2012-09-10 (×3): 40 mg via SUBCUTANEOUS
  Filled 2012-09-08 (×4): qty 0.4

## 2012-09-08 MED ORDER — SODIUM CHLORIDE 0.9 % IV SOLN
INTRAVENOUS | Status: DC | PRN
  Administered 2012-09-08: 10:00:00 via INTRAVENOUS

## 2012-09-08 MED ORDER — ARTIFICIAL TEARS OP OINT
TOPICAL_OINTMENT | OPHTHALMIC | Status: DC | PRN
  Administered 2012-09-08: 1 via OPHTHALMIC

## 2012-09-08 MED ORDER — SUCCINYLCHOLINE CHLORIDE 20 MG/ML IJ SOLN
INTRAMUSCULAR | Status: DC | PRN
  Administered 2012-09-08: 100 mg via INTRAVENOUS

## 2012-09-08 MED ORDER — KETOROLAC TROMETHAMINE 30 MG/ML IJ SOLN
INTRAMUSCULAR | Status: AC
  Administered 2012-09-08: 30 mg via INTRAVENOUS
  Filled 2012-09-08: qty 1

## 2012-09-08 MED ORDER — MIDAZOLAM HCL 2 MG/2ML IJ SOLN: 0.5000 mg | Freq: Once | INTRAMUSCULAR | Status: DC | PRN

## 2012-09-08 MED ORDER — KETOROLAC TROMETHAMINE 30 MG/ML IJ SOLN
15.0000 mg | Freq: Once | INTRAMUSCULAR | Status: AC | PRN
  Administered 2012-09-08: 30 mg via INTRAVENOUS

## 2012-09-08 MED ORDER — LIDOCAINE HCL (CARDIAC) 20 MG/ML IV SOLN
INTRAVENOUS | Status: DC | PRN
  Administered 2012-09-08: 60 mg via INTRAVENOUS

## 2012-09-08 MED ORDER — PHENYLEPHRINE HCL 10 MG/ML IJ SOLN
10.0000 mg | INTRAVENOUS | Status: DC | PRN
  Administered 2012-09-08: 20 ug/min via INTRAVENOUS

## 2012-09-08 MED ORDER — ACETAMINOPHEN 10 MG/ML IV SOLN
1000.0000 mg | Freq: Four times a day (QID) | INTRAVENOUS | Status: AC
  Administered 2012-09-08 – 2012-09-09 (×3): 1000 mg via INTRAVENOUS
  Filled 2012-09-08 (×4): qty 100

## 2012-09-08 MED ORDER — PROMETHAZINE HCL 25 MG/ML IJ SOLN: 6.2500 mg | INTRAMUSCULAR | Status: DC | PRN

## 2012-09-08 MED ORDER — ONDANSETRON HCL 4 MG/2ML IJ SOLN
INTRAMUSCULAR | Status: DC | PRN
  Administered 2012-09-08 (×2): 4 mg via INTRAVENOUS

## 2012-09-08 MED ORDER — MIDAZOLAM HCL 5 MG/5ML IJ SOLN
INTRAMUSCULAR | Status: DC | PRN
  Administered 2012-09-08: 2 mg via INTRAVENOUS

## 2012-09-08 MED ORDER — METHOCARBAMOL 500 MG PO TABS
1000.0000 mg | ORAL_TABLET | Freq: Four times a day (QID) | ORAL | Status: DC
  Administered 2012-09-08 – 2012-09-11 (×12): 1000 mg via ORAL
  Filled 2012-09-08 (×16): qty 2

## 2012-09-08 MED ORDER — HYDROMORPHONE 0.3 MG/ML IV SOLN
INTRAVENOUS | Status: AC
  Filled 2012-09-08: qty 25

## 2012-09-08 MED ORDER — ROCURONIUM BROMIDE 100 MG/10ML IV SOLN
INTRAVENOUS | Status: DC | PRN
  Administered 2012-09-08: 38 mg via INTRAVENOUS
  Administered 2012-09-08: 2 mg via INTRAVENOUS
  Administered 2012-09-08 (×3): 20 mg via INTRAVENOUS

## 2012-09-08 MED ORDER — PROPOFOL 10 MG/ML IV BOLUS
INTRAVENOUS | Status: DC | PRN
  Administered 2012-09-08: 200 mg via INTRAVENOUS

## 2012-09-08 MED ORDER — 0.9 % SODIUM CHLORIDE (POUR BTL) OPTIME
TOPICAL | Status: DC | PRN
  Administered 2012-09-08: 1000 mL

## 2012-09-08 MED ORDER — FENTANYL CITRATE 0.05 MG/ML IJ SOLN
INTRAMUSCULAR | Status: DC | PRN
  Administered 2012-09-08: 100 ug via INTRAVENOUS
  Administered 2012-09-08: 150 ug via INTRAVENOUS

## 2012-09-08 MED ORDER — GLYCOPYRROLATE 0.2 MG/ML IJ SOLN
INTRAMUSCULAR | Status: DC | PRN
  Administered 2012-09-08: .3 mg via INTRAVENOUS

## 2012-09-08 MED ORDER — ALBUMIN HUMAN 5 % IV SOLN
INTRAVENOUS | Status: DC | PRN
  Administered 2012-09-08 (×2): via INTRAVENOUS

## 2012-09-08 MED ORDER — MUPIROCIN 2 % EX OINT
TOPICAL_OINTMENT | Freq: Two times a day (BID) | CUTANEOUS | Status: DC
Start: 2012-09-08 — End: 2012-09-11
  Administered 2012-09-08 – 2012-09-11 (×7): via NASAL
  Filled 2012-09-08: qty 22

## 2012-09-08 SURGICAL SUPPLY — 82 items
APPLIER CLIP 11 MED OPEN (CLIP)
BIT DRILL AO MATTA 2.5MX230M (BIT) ×1 IMPLANT
BIT DRILL COUNTER SINK (DRILL) ×1 IMPLANT
BIT DRILL TWST MATTA 3.5MX195M (BIT) ×1 IMPLANT
BLADE SURG ROTATE 9660 (MISCELLANEOUS) ×2 IMPLANT
BNDG COHESIVE 6X5 TAN STRL LF (GAUZE/BANDAGES/DRESSINGS) ×2 IMPLANT
BONE CHIP PRESERV 20CC (Bone Implant) ×2 IMPLANT
BRUSH SCRUB DISP (MISCELLANEOUS) ×4 IMPLANT
CANISTER SUCTION 2500CC (MISCELLANEOUS) ×2 IMPLANT
CLIP APPLIE 11 MED OPEN (CLIP) IMPLANT
CLOTH BEACON ORANGE TIMEOUT ST (SAFETY) ×2 IMPLANT
COVER SURGICAL LIGHT HANDLE (MISCELLANEOUS) ×4 IMPLANT
DRAIN CHANNEL 10F 3/8 F FF (DRAIN) IMPLANT
DRAIN CHANNEL 15F RND FF W/TCR (WOUND CARE) IMPLANT
DRAPE C-ARM 42X72 X-RAY (DRAPES) ×2 IMPLANT
DRAPE C-ARMOR (DRAPES) ×2 IMPLANT
DRAPE INCISE IOBAN 66X45 STRL (DRAPES) IMPLANT
DRAPE INCISE IOBAN 85X60 (DRAPES) ×2 IMPLANT
DRAPE ORTHO SPLIT 77X108 STRL (DRAPES) ×2
DRAPE SURG ORHT 6 SPLT 77X108 (DRAPES) ×2 IMPLANT
DRAPE U-SHAPE 47X51 STRL (DRAPES) ×2 IMPLANT
DRILL BIT AO MATTA 2.5MX230M (BIT) ×2
DRILL COUNTER SINK (DRILL) ×2
DRILL TWIST AO MATTA 3.5MX195M (BIT) ×2
DRSG ADAPTIC 3X8 NADH LF (GAUZE/BANDAGES/DRESSINGS) IMPLANT
DRSG MEPILEX BORDER 4X12 (GAUZE/BANDAGES/DRESSINGS) ×4 IMPLANT
DRSG PAD ABDOMINAL 8X10 ST (GAUZE/BANDAGES/DRESSINGS) IMPLANT
ELECT BLADE 6.5 EXT (BLADE) ×2 IMPLANT
ELECT REM PT RETURN 9FT ADLT (ELECTROSURGICAL) ×2
ELECTRODE REM PT RTRN 9FT ADLT (ELECTROSURGICAL) ×1 IMPLANT
EVACUATOR 1/8 PVC DRAIN (DRAIN) IMPLANT
EVACUATOR SILICONE 100CC (DRAIN) IMPLANT
GAUZE SPONGE 4X4 16PLY XRAY LF (GAUZE/BANDAGES/DRESSINGS) IMPLANT
GLOVE BIO SURGEON STRL SZ7 (GLOVE) ×4 IMPLANT
GLOVE BIO SURGEON STRL SZ7.5 (GLOVE) ×2 IMPLANT
GLOVE BIO SURGEON STRL SZ8 (GLOVE) ×4 IMPLANT
GLOVE BIOGEL PI IND STRL 7.5 (GLOVE) ×3 IMPLANT
GLOVE BIOGEL PI IND STRL 8 (GLOVE) ×1 IMPLANT
GLOVE BIOGEL PI INDICATOR 7.5 (GLOVE) ×3
GLOVE BIOGEL PI INDICATOR 8 (GLOVE) ×1
GLOVE ECLIPSE 6.0 STRL STRAW (GLOVE) ×2 IMPLANT
GLOVE ECLIPSE 6.5 STRL STRAW (GLOVE) ×2 IMPLANT
GOWN BRE IMP SLV SIRUS LXLNG (GOWN DISPOSABLE) ×4 IMPLANT
GOWN PREVENTION PLUS XLARGE (GOWN DISPOSABLE) ×2 IMPLANT
GOWN PREVENTION PLUS XXLARGE (GOWN DISPOSABLE) IMPLANT
GOWN STRL NON-REIN LRG LVL3 (GOWN DISPOSABLE) ×2 IMPLANT
HANDPIECE INTERPULSE COAX TIP (DISPOSABLE) ×1
KIT BASIN OR (CUSTOM PROCEDURE TRAY) ×2 IMPLANT
KIT ROOM TURNOVER OR (KITS) ×2 IMPLANT
LIGHT ORTHO (MISCELLANEOUS) ×2 IMPLANT
LOOP VESSEL MAXI BLUE (MISCELLANEOUS) IMPLANT
MANIFOLD NEPTUNE II (INSTRUMENTS) IMPLANT
NEEDLE MAYO TROCAR (NEEDLE) ×2 IMPLANT
NS IRRIG 1000ML POUR BTL (IV SOLUTION) ×2 IMPLANT
PACK TOTAL JOINT (CUSTOM PROCEDURE TRAY) ×2 IMPLANT
PAD ARMBOARD 7.5X6 YLW CONV (MISCELLANEOUS) ×4 IMPLANT
PILLOW ABDUCTION HIP (SOFTGOODS) ×2 IMPLANT
PIN 6.0X180MM REDUCTION (PIN) ×2 IMPLANT
PLATE ACET STRT 94.5M 8H (Plate) ×2 IMPLANT
RETRIEVER SUT HEWSON (MISCELLANEOUS) ×2 IMPLANT
SCREW CORTEX ST MATTA 3.5X30MM (Screw) ×2 IMPLANT
SCREW CORTEX ST MATTA 3.5X32MM (Screw) ×2 IMPLANT
SCREW CORTEX ST MATTA 3.5X34MM (Screw) ×2 IMPLANT
SCREW CORTEX ST MATTA 3.5X40MM (Screw) ×6 IMPLANT
SET HNDPC FAN SPRY TIP SCT (DISPOSABLE) ×1 IMPLANT
SPONGE GAUZE 4X4 12PLY (GAUZE/BANDAGES/DRESSINGS) IMPLANT
SPONGE LAP 18X18 X RAY DECT (DISPOSABLE) ×2 IMPLANT
STAPLER VISISTAT 35W (STAPLE) ×2 IMPLANT
STRIP CLOSURE SKIN 1/2X4 (GAUZE/BANDAGES/DRESSINGS) IMPLANT
SUCTION FRAZIER TIP 10 FR DISP (SUCTIONS) ×2 IMPLANT
SUT FIBERWIRE #2 38 T-5 BLUE (SUTURE) ×4
SUT VIC AB 0 CT1 27 (SUTURE) ×2
SUT VIC AB 0 CT1 27XBRD ANBCTR (SUTURE) ×2 IMPLANT
SUT VIC AB 1 CT1 18XCR BRD 8 (SUTURE) ×1 IMPLANT
SUT VIC AB 1 CT1 8-18 (SUTURE) ×1
SUT VIC AB 2-0 CT1 27 (SUTURE) ×2
SUT VIC AB 2-0 CT1 TAPERPNT 27 (SUTURE) ×2 IMPLANT
SUTURE FIBERWR #2 38 T-5 BLUE (SUTURE) ×2 IMPLANT
TOWEL OR 17X24 6PK STRL BLUE (TOWEL DISPOSABLE) ×2 IMPLANT
TOWEL OR 17X26 10 PK STRL BLUE (TOWEL DISPOSABLE) ×8 IMPLANT
TRAY FOLEY CATH 14FR (SET/KITS/TRAYS/PACK) ×2 IMPLANT
WATER STERILE IRR 1000ML POUR (IV SOLUTION) IMPLANT

## 2012-09-08 NOTE — Preoperative (Signed)
Beta Blockers   Reason not to administer Beta Blockers:Not Applicable 

## 2012-09-08 NOTE — Progress Notes (Signed)
*  PRELIMINARY RESULTS* Vascular Ultrasound Lower extremity venous duplex has been completed.  Preliminary findings: Bilateral:  No evidence of DVT, superficial thrombosis, or Baker's Cyst.   Farrel Demark, RDMS, RVT 09/08/2012, 5:35 PM

## 2012-09-08 NOTE — Brief Op Note (Signed)
09/05/2012 - 09/08/2012  4:30 PM  PATIENT:  [name]  51 y.o. male  PRE-OPERATIVE DIAGNOSIS:  RIGHT ACETABULUM FRACTURE  POST-OPERATIVE DIAGNOSIS:  RIGHT ACETABULUM FRACTURE  PROCEDURE:  Procedure(s) (LRB) with comments: OPEN REDUCTION INTERNAL FIXATION (ORIF) ACETABULAR FRACTURE (Right)  SURGEON:  Surgeon(s) and Role:    * Budd Palmer, MD - Primary  PHYSICIAN ASSISTANT: Montez Morita, [facility]  ANESTHESIA:   general  EBL:  Total I/O In: 2900 [I.V.:2300; Blood:350; IV Piggyback:250] Out: 1200 [Urine:500; Blood:700]  BLOOD ADMINISTERED:none and 700 CC PRBC  DRAINS: none   LOCAL MEDICATIONS USED:  NONE  SPECIMEN:  No Specimen  DISPOSITION OF SPECIMEN:  N/A  COUNTS:  YES  TOURNIQUET:  * No tourniquets in log *  DICTATION: .Other Dictation: Dictation Number [id number]  PLAN OF CARE: Admit to inpatient   PATIENT DISPOSITION:  PACU - hemodynamically stable.   Delay start of Pharmacological VTE agent (>24hrs) due to surgical blood loss or risk of bleeding: no

## 2012-09-08 NOTE — Progress Notes (Signed)
Patient c/o R leg pain below knee and thinks it was a bit swollen.  Mild edema on exam of calf. Will check venous duplex. Patient examined and I agree with the assessment and plan  Violeta Gelinas, MD, MPH, FACS Pager: [phone]  09/08/2012 3:16 PM

## 2012-09-08 NOTE — Anesthesia Preprocedure Evaluation (Addendum)
Anesthesia Evaluation  Patient identified by MRN, date of birth, ID band Patient awake    Reviewed: Allergy & Precautions, H&P , Patient's Chart, lab work & pertinent test results, reviewed documented beta blocker date and time   History of Anesthesia Complications Negative for: history of anesthetic complications  Airway Mallampati: II TM Distance: >3 FB Neck ROM: full    Dental No notable dental hx.    Pulmonary neg pulmonary ROS, asthma , sleep apnea ,  breath sounds clear to auscultation  Pulmonary exam normal       Cardiovascular Exercise Tolerance: Good hypertension, + CAD negative cardio ROS  Rhythm:regular Rate:Normal     Neuro/Psych negative neurological ROS  negative psych ROS   GI/Hepatic negative GI ROS, Neg liver ROS,   Endo/Other  negative endocrine ROS  Renal/GU negative Renal ROS     Musculoskeletal   Abdominal   Peds  Hematology negative hematology ROS (+)   Anesthesia Other Findings Left inguinal hernia     Right inguinal hernia        Hypertension     Hyperlipidemia        Asthma     Allergy        Postsurgical aortocoronary bypass status     CAD (coronary artery disease)    Reproductive/Obstetrics negative OB ROS                           Anesthesia Physical Anesthesia Plan  ASA: III  Anesthesia Plan: General ETT   Post-op Pain Management:    Induction:   Airway Management Planned:   Additional Equipment:   Intra-op Plan:   Post-operative Plan:   Informed Consent: I have reviewed the patients History and Physical, chart, labs and discussed the procedure including the risks, benefits and alternatives for the proposed anesthesia with the patient or authorized representative who has indicated his/her understanding and acceptance.   Dental Advisory Given  Plan Discussed with: CRNA and Surgeon  Anesthesia Plan Comments:         Anesthesia Quick  Evaluation

## 2012-09-08 NOTE — Transfer of Care (Signed)
Immediate Anesthesia Transfer of Care Note  Patient: [name]  Procedure(s) Performed: Procedure(s) (LRB) with comments: OPEN REDUCTION INTERNAL FIXATION (ORIF) ACETABULAR FRACTURE (Right)  Patient Location: PACU  Anesthesia Type:General  Level of Consciousness: awake and sedated  Airway & Oxygen Therapy: Patient Spontanous Breathing  Post-op Assessment: Report given to PACU RN and Post -op Vital signs reviewed and stable  Post vital signs: Reviewed and stable  Complications: No apparent anesthesia complications

## 2012-09-08 NOTE — Progress Notes (Signed)
Patient ID: [name], male   DOB: [birth year], 51 y.o.   MRN: [id number]   LOS: 3 days   Subjective: Just waking up from anesthesia. Pain controlled with PCA.  Objective: Vital signs in last 24 hours: Temp:  [98 F (36.7 C)-98.8 F (37.1 C)] 98.1 F (36.7 C) (12/03 1440) Pulse Rate:  [58-76] 74  (12/03 1440) Resp:  [8-18] 13  (12/03 1440) BP: (132-150)/(52-70) 147/52 mmHg (12/03 1440) SpO2:  [96 %-100 %] 99 % (12/03 1440) Last BM Date: 09/04/12   Lab Results:  CBC  Basename 09/08/12 0555 09/07/12 1520  WBC 8.2 7.1  HGB 9.1* 8.3*  HCT 26.2* 24.0*  PLT 136* 122*   BMET  Basename 09/08/12 0555 09/07/12 1520  NA 137 137  K 4.3 4.5  CL 104 104  CO2 29 30  GLUCOSE 105* 107*  BUN 10 8  CREATININE 0.83 0.98  CALCIUM 8.1* 7.9*    Radiology CXR: Small left PTX (official read pending)   General appearance: alert and no distress Resp: clear to auscultation bilaterally Cardio: regular rate and rhythm GI: normal findings: Soft, NT, absent BS.   Assessment/Plan: Fall  Left PTX s/p CT -- CT back to suction Right acet fx/dislocation s/p ORIF  Mult lumbar TVP fxs  Mult med probs -- Home meds  FEN -- Cont PCA today. D/C foley. VTE -- Restart Lovenox tomorrow, SCD's  Dispo -- PT/OT following surgery.    [name], PA-C Pager: [phone] General Trauma PA Pager: [phone]   09/08/2012

## 2012-09-08 NOTE — Anesthesia Postprocedure Evaluation (Signed)
Anesthesia Post Note  Patient: [name]  Procedure(s) Performed: Procedure(s) (LRB): OPEN REDUCTION INTERNAL FIXATION (ORIF) ACETABULAR FRACTURE (Right)  Anesthesia type: GA  Patient location: PACU  Post pain: Pain level controlled  Post assessment: Post-op Vital signs reviewed  Last Vitals:  Filed Vitals:   09/08/12 1215  BP: 145/68  Pulse: 69  Temp: 36.8 C  Resp: 8    Post vital signs: Reviewed  Level of consciousness: sedated  Complications: No apparent anesthesia complications

## 2012-09-08 NOTE — Consult Note (Signed)
I have seen and examined the patient. I agree with the findings above. I discussed with the patient the risks and benefits of surgery, including the possibility of infection, nerve injury, vessel injury, wound breakdown, arthritis, instability, symptomatic hardware, DVT/ PE, loss of motion, and possible need for further surgery including hip replacement, among others.  He and his wife understood these risks and wished to proceed.   Budd Palmer, MD 09/08/2012

## 2012-09-09 ENCOUNTER — Encounter (HOSPITAL_COMMUNITY): Payer: Self-pay | Admitting: Orthopedic Surgery

## 2012-09-09 ENCOUNTER — Inpatient Hospital Stay (HOSPITAL_COMMUNITY): Payer: BC Managed Care – PPO

## 2012-09-09 ENCOUNTER — Encounter: Payer: Self-pay | Admitting: Radiation Oncology

## 2012-09-09 ENCOUNTER — Ambulatory Visit
Admit: 2012-09-09 | Discharge: 2012-09-09 | Disposition: A | Discharge status: Home / Self Care | Payer: BC Managed Care – PPO | Attending: Radiation Oncology | Admitting: Radiation Oncology

## 2012-09-09 DIAGNOSIS — D62 Acute posthemorrhagic anemia: Secondary | ICD-10-CM | POA: Diagnosis not present

## 2012-09-09 DIAGNOSIS — M6140 Other calcification of muscle, unspecified site: Secondary | ICD-10-CM

## 2012-09-09 DIAGNOSIS — M9689 Other intraoperative and postprocedural complications and disorders of the musculoskeletal system: Secondary | ICD-10-CM

## 2012-09-09 DIAGNOSIS — S32401A Unspecified fracture of right acetabulum, initial encounter for closed fracture: Secondary | ICD-10-CM

## 2012-09-09 LAB — URINALYSIS, ROUTINE W REFLEX MICROSCOPIC
Bilirubin Urine: NEGATIVE
Glucose, UA: NEGATIVE mg/dL
Hgb urine dipstick: NEGATIVE
Ketones, ur: NEGATIVE mg/dL
Leukocytes, UA: NEGATIVE
Nitrite: NEGATIVE
Protein, ur: NEGATIVE mg/dL
Specific Gravity, Urine: 1.026 (ref 1.005–1.030)
Urobilinogen, UA: 1 mg/dL (ref 0.0–1.0)
pH: 6.5 (ref 5.0–8.0)

## 2012-09-09 LAB — BASIC METABOLIC PANEL
BUN: 15 mg/dL (ref 6–23)
CO2: 28 mEq/L (ref 19–32)
Calcium: 7.8 mg/dL — ABNORMAL LOW (ref 8.4–10.5)
Chloride: 102 mEq/L (ref 96–112)
Creatinine, Ser: 0.8 mg/dL (ref 0.50–1.35)
GFR calc Af Amer: >90 mL/min (ref >90)
GFR calc non Af Amer: >90 mL/min (ref >90)
Glucose, Bld: 114 mg/dL — ABNORMAL HIGH (ref 70–99)
Potassium: 4.2 mEq/L (ref 3.5–5.1)
Sodium: 135 mEq/L (ref 135–145)

## 2012-09-09 LAB — CBC
HCT: 23.8 % — ABNORMAL LOW (ref 39.0–52.0)
Hemoglobin: 8.2 g/dL — ABNORMAL LOW (ref 13.0–17.0)
MCH: 31.1 pg (ref 26.0–34.0)
MCHC: 34.5 g/dL (ref 30.0–36.0)
MCV: 90.2 fL (ref 78.0–100.0)
Platelets: 142 10*3/uL — ABNORMAL LOW (ref 150–400)
RBC: 2.64 MIL/uL — ABNORMAL LOW (ref 4.22–5.81)
RDW: 13.6 % (ref 11.5–15.5)
WBC: 6.7 10*3/uL (ref 4.0–10.5)

## 2012-09-09 LAB — TYPE AND SCREEN
ABO/RH(D): A POS
Antibody Screen: NEGATIVE
Unit division: 0
Unit division: 0

## 2012-09-09 MED ORDER — WARFARIN VIDEO: Freq: Once | Status: DC

## 2012-09-09 MED ORDER — ACETAMINOPHEN 10 MG/ML IV SOLN: 1000.0000 mg | Freq: Four times a day (QID) | INTRAVENOUS | Status: DC

## 2012-09-09 MED ORDER — ACETAMINOPHEN 500 MG PO TABS
1000.0000 mg | ORAL_TABLET | Freq: Four times a day (QID) | ORAL | Status: AC
  Administered 2012-09-09 – 2012-09-10 (×3): 1000 mg via ORAL
  Filled 2012-09-09 (×4): qty 2

## 2012-09-09 MED ORDER — ACETAMINOPHEN 325 MG PO TABS
650.0000 mg | ORAL_TABLET | Freq: Once | ORAL | Status: AC
  Administered 2012-09-09: 650 mg via ORAL
  Filled 2012-09-09: qty 2

## 2012-09-09 MED ORDER — COUMADIN BOOK
Freq: Once | Status: DC
  Filled 2012-09-09: qty 1

## 2012-09-09 MED ORDER — DOCUSATE SODIUM 100 MG PO CAPS
200.0000 mg | ORAL_CAPSULE | Freq: Two times a day (BID) | ORAL | Status: DC
  Administered 2012-09-09 – 2012-09-11 (×5): 200 mg via ORAL
  Filled 2012-09-09: qty 1
  Filled 2012-09-09 (×4): qty 2

## 2012-09-09 MED ORDER — OXYCODONE HCL 5 MG PO TABS
5.0000 mg | ORAL_TABLET | ORAL | Status: DC | PRN
  Administered 2012-09-09 – 2012-09-10 (×4): 15 mg via ORAL
  Administered 2012-09-10: 10 mg via ORAL
  Filled 2012-09-09 (×5): qty 3

## 2012-09-09 MED ORDER — WARFARIN SODIUM 7.5 MG PO TABS
7.5000 mg | ORAL_TABLET | Freq: Once | ORAL | Status: AC
  Administered 2012-09-09: 7.5 mg via ORAL
  Filled 2012-09-09: qty 1

## 2012-09-09 MED ORDER — WARFARIN - PHARMACIST DOSING INPATIENT: Freq: Every day | Status: DC

## 2012-09-09 MED ORDER — HYDROMORPHONE HCL PF 1 MG/ML IJ SOLN
0.5000 mg | INTRAMUSCULAR | Status: DC | PRN
  Administered 2012-09-10: 0.5 mg via INTRAVENOUS
  Filled 2012-09-09 (×2): qty 1

## 2012-09-09 MED ORDER — ZOLPIDEM TARTRATE 5 MG PO TABS: 5.0000 mg | ORAL_TABLET | Freq: Every evening | ORAL | Status: DC | PRN

## 2012-09-09 MED ORDER — POLYETHYLENE GLYCOL 3350 17 G PO PACK
17.0000 g | PACK | Freq: Every day | ORAL | Status: DC
  Administered 2012-09-09 – 2012-09-11 (×3): 17 g via ORAL
  Filled 2012-09-09 (×3): qty 1

## 2012-09-09 NOTE — Progress Notes (Signed)
5:53pm  At 5:30pm patient transported to the treatment room.  At this time he graded pain as a level 6 on a scale of 0-10.  Moving t treatment table aggravated his discomfort.  While lying flat place [name] on O2 at 2 liters /min.  Clear breath sounds right lung and left upper lung.  Chest Tube intact.

## 2012-09-09 NOTE — Progress Notes (Signed)
Occupational Therapy Evaluation Patient Details Name: [name] MRN: [id number] DOB: [birth year] Today's Date: 09/09/2012 Time: 7846-9629 OT Time Calculation (min): 45 min  OT Assessment / Plan / Recommendation Clinical Impression  51 yo s/p fall from ladder with  R acetabular fx, TP L2-4 fxs and L pneumothorax. RLE TWB. LLE WBAT. posterior hip precautions. Pt will benefit from skilled OT acute services to max independence with ADL and functinal mobility for ADL to facilitte D/C to next venue. Feel pt would benefit from short CIR stay before returning home with supportive wife. Pt agreeable.    OT Assessment  Patient needs continued OT Services    Follow Up Recommendations  CIR    Barriers to Discharge None    Equipment Recommendations  Rolling walker with 5" wheels;Wheelchair (measurements)    Recommendations for Other Services Rehab consult  Frequency  Min 3X/week    Precautions / Restrictions Precautions Precautions: Posterior Hip Precaution Booklet Issued: Yes (comment) Precaution Comments: precautions explained Restrictions Weight Bearing Restrictions: Yes RLE Weight Bearing: Touchdown weight bearing Other Position/Activity Restrictions: LLE WBAT, also reviewed log rolling for protection of lumbar fxs   Pertinent Vitals/Pain 4/10. PCA    ADL  Eating/Feeding: Independent Grooming: Supervision/safety;Set up Where Assessed - Grooming: Supported sitting Upper Body Bathing: Minimal assistance Where Assessed - Upper Body Bathing: Supported sitting Lower Body Bathing: +1 Total assistance Where Assessed - Lower Body Bathing: Supported sit to stand Upper Body Dressing: Minimal assistance Where Assessed - Upper Body Dressing: Supported sitting Lower Body Dressing: +1 Total assistance Where Assessed - Lower Body Dressing: Supported sit to Pharmacist, hospital: +2 Total assistance Toilet Transfer: Patient Percentage: 60% Statistician Method: Sit to Teacher, music:  (sit - stand) Toileting - Clothing Manipulation and Hygiene: +1 Total assistance Where Assessed - Glass blower/designer Manipulation and Hygiene: Standing Equipment Used: Gait belt Transfers/Ambulation Related to ADLs: +2 total A ADL Comments: no knowledge of hip precautions and AE. limited by precautions and pain    OT Diagnosis: Generalized weakness;Acute pain  OT Problem List: Decreased strength;Decreased range of motion;Decreased activity tolerance;Impaired balance (sitting and/or standing);Decreased safety awareness;Decreased knowledge of use of DME or AE;Decreased knowledge of precautions;Cardiopulmonary status limiting activity;Pain OT Treatment Interventions: Self-care/ADL training;Therapeutic exercise;Energy conservation;DME and/or AE instruction;Therapeutic activities;Patient/family education;Balance training   OT Goals Acute Rehab OT Goals OT Goal Formulation: With patient Time For Goal Achievement: 09/23/12 Potential to Achieve Goals: Good ADL Goals Pt Will Perform Grooming: Unsupported;Sitting at sink;Standing at sink;with modified independence ADL Goal: Grooming - Progress: Goal set today Pt Will Perform Lower Body Dressing: with supervision;with caregiver independent in assisting;Sit to stand from bed;Unsupported;with adaptive equipment;with cueing (comment type and amount) ADL Goal: Lower Body Dressing - Progress: Goal set today Pt Will Transfer to Toilet: with supervision;with caregiver independent in assisting;Ambulation;with DME;3-in-1;Maintaining weight bearing status;Maintaining hip precautions ADL Goal: Toilet Transfer - Progress: Goal set today Pt Will Perform Toileting - Clothing Manipulation: with supervision;Standing;Sitting on 3-in-1 or toilet;with cueing (comment type and amount);with adaptive equipment ADL Goal: Toileting - Clothing Manipulation - Progress: Goal set today Pt Will Perform Toileting - Hygiene: with supervision;with caregiver  independent in assisting;Standing at 3-in-1/toilet;Sit to stand from 3-in-1/toilet;with adaptive equipment;with cueing (comment type and amount) ADL Goal: Toileting - Hygiene - Progress: Goal set today Additional ADL Goal #1: Pt will state 3/3 hip precautions independently ADL Goal: Additional Goal #1 - Progress: Goal set today  Visit Information  Last OT Received On: 09/09/12 Assistance Needed: +2 PT/OT Co-Evaluation/Treatment: Yes  Subjective Data      Prior Functioning     Home Living Lives With: Spouse Available Help at Discharge: Available 24 hours/day Type of Home: House Home Access: Stairs to enter [employer] of Steps: 3 Entrance Stairs-Rails: None Home Layout: Two level;Able to live on main level with bedroom/bathroom Alternate Level Stairs-Number of Steps: 14 Bathroom Shower/Tub: Walk-in shower;Door Teacher, early years/pre: Yes How Accessible: Accessible via walker Home Adaptive Equipment: None Prior Function Level of Independence: Independent Able to Take Stairs?: Yes Driving: Yes Vocation: Full time employment Comments: Acupuncturist, 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, PTA was traveling by plane fairly often Communication Communication: No difficulties Dominant Hand: Right         Vision/Perception     Cognition  Overall Cognitive Status: Appears within functional limits for tasks assessed/performed Arousal/Alertness: Awake/alert Orientation Level: Appears intact for tasks assessed Behavior During Session: [facility] for tasks performed    Extremity/Trunk Assessment Right Upper Extremity Assessment RUE ROM/Strength/Tone: WFL for tasks assessed RUE Sensation: WFL - Light Touch;WFL - Proprioception RUE Coordination: WFL - gross/fine motor Left Upper Extremity Assessment LUE ROM/Strength/Tone: Deficits;Due to pain LUE Sensation: WFL - Light Touch;WFL - Proprioception LUE Coordination: WFL - gross/fine motor Right  Lower Extremity Assessment RLE ROM/Strength/Tone: Deficits;Due to pain;Due to precautions RLE ROM/Strength/Tone Deficits: hip flex 1/5, knee ext 2/5, keeps bilateral hips in external rotation (PTA) RLE Sensation: WFL - Light Touch RLE Coordination: Deficits RLE Coordination Deficits: decreased ability to move leg Left Lower Extremity Assessment LLE ROM/Strength/Tone: Within functional levels LLE Sensation: WFL - Light Touch;WFL - Proprioception LLE Coordination: WFL - gross motor Trunk Assessment Trunk Assessment: Other exceptions Trunk Exceptions: TP fxs L2-4     Mobility Bed Mobility Bed Mobility: Supine to Sit;Sit to Supine;Sitting - Scoot to Edge of Bed Supine to Sit: 1: +2 Total assist;With rails;HOB elevated Supine to Sit: Patient Percentage: 50% Sitting - Scoot to Edge of Bed: 3: Mod assist Sit to Supine: 1: +2 Total assist Sit to Supine: Patient Percentage: 30% Details for Bed Mobility Assistance: vc's for sequencing, right LE supported, assistance given at trunk, vc's for breathing Transfers Transfers: Sit to Stand;Stand to Sit Sit to Stand: 1: +2 Total assist;From bed;From elevated surface;With upper extremity assist Sit to Stand: Patient Percentage: 60% Stand to Sit: 1: +2 Total assist;To bed;To elevated surface;With upper extremity assist Stand to Sit: Patient Percentage: 60% Details for Transfer Assistance: did not transfer to chair because pt preparing to go to [facility] for radiation treatment. (A) to help pt wt-shift fwd for standing. OT's foot under pt's right heel to assure TDWB. Allowed pt to push through RW this session with bed elevated.     Shoulder Instructions     Exercise General Exercises - Lower Extremity Ankle Circles/Pumps: AROM;Both;10 reps;Supine Quad Sets: AROM;Right;10 reps;Supine;Limitations Quad Sets Limitations: quad tremor with contraction   Balance Balance Balance Assessed: Yes Static Sitting Balance Static Sitting - Balance Support: Feet  supported;Bilateral upper extremity supported Static Sitting - Level of Assistance: 4: Min assist;5: Stand by assistance   End of Session OT - End of Session Equipment Utilized During Treatment: Gait belt Activity Tolerance: Patient limited by pain;Other (comment) (limited by c/o nausea) Patient left: in bed;with call bell/phone within reach;with family/visitor present Nurse Communication: Mobility status;Precautions;Weight bearing status  GO     Miyoko Hashimi,HILLARY 09/09/2012, 2:12 PM [facility], OTR/L  [phone] 09/09/2012

## 2012-09-09 NOTE — Progress Notes (Signed)
UR completed 

## 2012-09-09 NOTE — Progress Notes (Signed)
Patient ID: [name], male   DOB: 06-Jun-1961, 51 y.o.   MRN: [id number]   LOS: 4 days   Subjective: Doing pretty well. Having some soreness in hip and at CT site but PCA managing. No issues with voiding after foley removed but has had some burning with urination. Requests prn sleep aid.  Objective: Vital signs in last 24 hours: Temp:  [97.5 F (36.4 C)-99.3 F (37.4 C)] 98.9 F (37.2 C) (12/04 0516) Pulse Rate:  [58-76] 71  (12/04 0516) Resp:  [8-20] 16  (12/04 0516) BP: (107-150)/(45-70) 131/64 mmHg (12/04 0516) SpO2:  [98 %-100 %] 99 % (12/04 0516) Last BM Date:  (prior to admission)   CT No air leak Minimal OP   Lab Results:  CBC  Basename 09/09/12 0530 09/08/12 1138 09/08/12 0555  WBC 6.7 -- 8.2  HGB 8.2* 7.8* --  HCT 23.8* 23.0* --  PLT 142* -- 136*   BMET  Basename 09/09/12 0530 09/08/12 1138 09/08/12 0555  NA 135 136 --  K 4.2 4.4 --  CL 102 -- 104  CO2 28 -- 29  GLUCOSE 114* 119* --  BUN 15 -- 10  CREATININE 0.80 -- 0.83  CALCIUM 7.8* -- 8.1*    Radiology CXR: Improved left PTX (official read pending)   General appearance: alert and no distress Resp: clear to auscultation bilaterally Cardio: regular rate and rhythm GI: normal findings: bowel sounds normal and soft, non-tender Extremities: NVI   Assessment/Plan: Fall  Left PTX s/p CT -- CT to water seal Right acet fx/dislocation s/p ORIF -- to [facility] for rad tx today Mult lumbar TVP fxs  ABL anemia -- Stable Mult med probs -- Home meds  FEN -- Will d/c PCA once pt back from [facility]. Check UA with burning but I suspect urethral irritation. Increase bowel regimen. Add ambien. VTE -- Lovenox, SCD's. BLE dopplers negative yesterday. Dispo -- PT/OT, CT    [name], PA-C Pager: [phone] General Trauma PA Pager: [phone]   09/09/2012

## 2012-09-09 NOTE — Consult Note (Addendum)
Radiation Oncology         [phone] ________________________________  Initial inpatient Consultation  Name: [name] MRN: [id number]  Date: 09/05/2012  DOB: October 08, 1960  WU:[id number],NWGN, MD  No ref. provider found   REFERRING PHYSICIAN: Malon Kindle MD  DIAGNOSIS: The primary encounter diagnosis was Fall. Diagnoses of Traumatic pneumothorax, Pneumomediastinum, Closed fracture of transverse process of lumbar vertebra, and Dislocation of right hip were also pertinent to this visit.  HISTORY OF PRESENT ILLNESS::[name] is a 52 y.o. male who was admitted to the hospital on 09/05/2012 after falling off of his roof while cleaning the gutters.  He had immediate right hip pain. He was found to have a right hip fracture/dislocation as shown on the CT scan. He also had lumbar transverse process fractures, a pneumothorax. On 09/08/2012 he underwent open reduction/internal fixation of his right acetabular fracture. X-rays performed postoperatively showed that the fracture was well reduced with the fixation plate and screws. There is no evidence of post-operative complications.   He presents to me for adjuvant options to decrease the risk of heterotopic ossification. He has a chest tube in place for his resolving pneumothorax. He is no longer on a PCA pump as this could not be continued during ambulance transfer from [facility]. He is on oxycodone for pain. He reports that he has not been significant pain at rest but during transfers from his gurney to other surfaces, the movement causes excruciating pain. He has never had cancer before, he denies history of radiotherapy. He is with his wife.  PREVIOUS RADIATION THERAPY: No  PAST MEDICAL HISTORY:  has a past medical history of Left inguinal hernia; Right inguinal hernia; Hypertension; Hyperlipidemia; Asthma; Allergy; Postsurgical aortocoronary bypass status; and CAD (coronary artery disease).    PAST SURGICAL HISTORY: Past  Surgical History  Procedure Date  . Double coronary artery bypass   . Hernia repair     left and right inguinal  . Orif acetabular fracture 09/08/2012    Procedure: OPEN REDUCTION INTERNAL FIXATION (ORIF) ACETABULAR FRACTURE;  Surgeon: Budd Palmer, MD;  Location: [facility] OR;  Service: Orthopedics;  Laterality: Right;    FAMILY HISTORY: family history includes Hyperlipidemia in his father and mother and Hypertension in his father and mother.  SOCIAL HISTORY:  reports that he has never smoked. He does not have any smokeless tobacco history on file. He reports that he does not drink alcohol or use illicit drugs. he is with his wife, They have 5 children, ranging from ages 85-28. They do not intend have any more children. He is an Acupuncturist   ALLERGIES: Cefaclor  MEDICATIONS:  Current Facility-Administered Medications  Medication Dose Route Frequency Provider Last Rate Last Dose  . acetaminophen (OFIRMEV) IV 1,000 mg  1,000 mg Intravenous Q6H Mearl Latin, PA   1,000 mg at 09/09/12 0558  . acetaminophen (TYLENOL) tablet 1,000 mg  1,000 mg Oral Q6H Mearl Latin, PA      . atorvastatin (LIPITOR) tablet 20 mg  20 mg Oral q1800 Liz Malady, MD   20 mg at 09/08/12 1715  . coumadin book   Does not apply Once [name], PA      . docusate sodium (COLACE) capsule 200 mg  200 mg Oral BID [name], PA   200 mg at 09/09/12 0920  . enoxaparin (LOVENOX) injection 40 mg  40 mg Subcutaneous Q24H [name], PA   40 mg at 09/08/12 2300  .  HYDROmorphone (DILAUDID) injection 0.5 mg  0.5 mg Intravenous Q4H PRN [name], PA      . [EXPIRED] HYDROmorphone PCA 0.3 mg/mL (DILAUDID) 0.3 mg/mL infusion           . methocarbamol (ROBAXIN) tablet 1,000 mg  1,000 mg Oral QID Mearl Latin, PA   1,000 mg at 09/09/12 4098  . mupirocin ointment (BACTROBAN) 2 %   Nasal BID Budd Palmer, MD      . ondansetron [facility]) tablet 4 mg  4 mg Oral Q6H PRN Liz Malady, MD        Or  . ondansetron [facility]) injection 4 mg  4 mg Intravenous Q6H PRN Liz Malady, MD   4 mg at 09/07/12 2044  . oxyCODONE (Oxy IR/ROXICODONE) immediate release tablet 5-15 mg  5-15 mg Oral Q4H PRN [name], PA   15 mg at 09/09/12 1258  . polyethylene glycol (MIRALAX / GLYCOLAX) packet 17 g  17 g Oral Daily [name], PA   17 g at 09/09/12 0920  . warfarin (COUMADIN) tablet 7.5 mg  7.5 mg Oral ONCE-1800 Mearl Latin, PA      . warfarin (COUMADIN) video   Does not apply Once [name], PA      . Warfarin - Pharmacist Dosing Inpatient   Does not apply q1800 Mearl Latin, PA      . zolpidem [facility]) tablet 5 mg  5 mg Oral QHS PRN [name], PA      . [DISCONTINUED] acetaminophen (OFIRMEV) IV 1,000 mg  1,000 mg Intravenous Q6H Mearl Latin, PA      . [DISCONTINUED] acetaminophen (TYLENOL) tablet 650 mg  650 mg Oral Q4H PRN Liz Malady, MD      . [DISCONTINUED] dextrose 5 % and 0.45 % NaCl with KCl 20 mEq/L infusion   Intravenous Continuous [name], PA 50 mL/hr at 09/08/12 2325    . [DISCONTINUED] docusate sodium (COLACE) capsule 100 mg  100 mg Oral BID Liz Malady, MD   100 mg at 09/08/12 2300  . [DISCONTINUED] fentaNYL (SUBLIMAZE) injection 25-50 mcg  25-50 mcg Intravenous Q5 min PRN Velna Hatchet, MD   50 mcg at 09/08/12 1314  . [DISCONTINUED] HYDROmorphone (DILAUDID) PCA injection 0.3 mg/mL   Intravenous Q4H Liz Malady, MD      . [DISCONTINUED] midazolam (VERSED) injection 0.5-2 mg  0.5-2 mg Intravenous Once PRN Velna Hatchet, MD      . [DISCONTINUED] ondansetron [facility]) injection 4 mg  4 mg Intravenous Q6H PRN Liz Malady, MD      . [DISCONTINUED] promethazine (PHENERGAN) injection 6.25-12.5 mg  6.25-12.5 mg Intravenous Q15 min PRN Velna Hatchet, MD        REVIEW OF SYSTEMS:  As above   PHYSICAL EXAM:  height is 5\' 9"  (1.753 m) and weight is 164 lb 11.2 oz (74.707 kg). His oral temperature is 98.9 F (37.2  C). His blood pressure is 131/64 and his pulse is 71. His respiration is 14 and oxygen saturation is 97%.   Lying on a gurney in no acute distress. In significant pain with movement. Alert, oriented, answering appropriate questions. Judgment and insight are intact.  LABORATORY DATA:  Lab Results  Component Value Date   WBC 6.7 09/09/2012   HGB 8.2* 09/09/2012   HCT 23.8* 09/09/2012   MCV 90.2 09/09/2012   PLT 142* 09/09/2012   CMP  Component Value Date/Time   NA 135 09/09/2012 0530   K 4.2 09/09/2012 0530   CL 102 09/09/2012 0530   CO2 28 09/09/2012 0530   GLUCOSE 114* 09/09/2012 0530   BUN 15 09/09/2012 0530   CREATININE 0.80 09/09/2012 0530   CALCIUM 7.8* 09/09/2012 0530   PROT 5.9* 09/05/2012 1350   ALBUMIN 3.5 09/05/2012 1350   AST 34 09/05/2012 1350   ALT 24 09/05/2012 1350   ALKPHOS 63 09/05/2012 1350   BILITOT 0.6 09/05/2012 1350   GFRNONAA >90 09/09/2012 0530   GFRAA >90 09/09/2012 0530       RADIOGRAPHY: As above    IMPRESSION/PLAN: 51 year old gentleman, sent by orthopedic surgery for consideration of adjuvant treatment to prevent heterotopic ossification. He recently underwent surgery (yesterday) for right hip fracture.  I spoke with the patient about options including NSAIDS versus radiotherapy to decrease his risk of heterotopic ossification. I talked about the differing side effects of each treatment; the efficacy is essentially the same. The patient and his wife understand that radiotherapy carries a risk of some fatigue and mild skin irritation. The risk of secondary malignancies is very very low. We will move his genitalia away from the treatment fields to decrease his risk of decreased sperm count. He is not concerned about that, however, as he is no longer planning to have more children.  The patient and his wife are enthusiastic about pursuing radiotherapy. Consent form has been signed and placed in his chart. We will plan to deliver 7 Gy in 1 fraction  today.  __________________________________________   Lonie Peak, MD

## 2012-09-09 NOTE — Progress Notes (Addendum)
Rehab Admissions Coordinator Note:  Patient was screened by Brock Ra for appropriateness for an Inpatient Acute Rehab Consult.  At this time, we are recommending Inpatient Rehab consult.   Brock Ra 09/09/2012, 2:07 PM  I can be reached at [phone].

## 2012-09-09 NOTE — Progress Notes (Deleted)
Simulation note, treatment planning note  Inpatient  Diagnosis: right hip fracture  Prophylaxis against heterotopic ossification  The patient was laid in the supine position on the CT simulation table. High-resolution CT axial imaging was obtained of his pelvis. An isocenter was placed in the right hip. Skin markings are made and he tolerated this well.  Treatment planning- I will treat the patient's right hip with an AP PA arrangement using wedges and MLCs for each of the fields. 10 MV photons will be used. I am prescribing 7 Gy in 1 fraction.  -----------------------------------  Lonie Peak, MD

## 2012-09-09 NOTE — Progress Notes (Signed)
Robaxin 1000 mg tabs given at 3:15pm.

## 2012-09-09 NOTE — Progress Notes (Signed)
ANTICOAGULATION CONSULT NOTE - Initial Consult  Pharmacy Consult for Coumadin Indication: VTE prophylaxis  Allergies  Allergen Reactions  . Cefaclor     REACTION: rash    Patient Measurements: Height: 5\' 9"  (175.3 cm) Weight: 164 lb 11.2 oz (74.707 kg) IBW/kg (Calculated) : 70.7   Vital Signs: Temp: 98.9 F (37.2 C) (12/04 0516) Temp src: Oral (12/04 0516) BP: 131/64 mmHg (12/04 0516) Pulse Rate: 71  (12/04 0516)  Labs:  Basename 09/09/12 0530 09/08/12 1138 09/08/12 1043 09/08/12 0555 09/07/12 1520  HGB 8.2* 7.8* -- -- --  HCT 23.8* 23.0* 19.0* -- --  PLT 142* -- -- 136* 122*  APTT -- -- -- -- 32  LABPROT -- -- -- -- 14.0  INR -- -- -- -- 1.09  HEPARINUNFRC -- -- -- -- --  CREATININE 0.80 -- -- 0.83 0.98  CKTOTAL -- -- -- -- --  CKMB -- -- -- -- --  TROPONINI -- -- -- -- --    Estimated Creatinine Clearance: 106.8 ml/min (by C-G formula based on Cr of 0.8).   Medical History: Past Medical History  Diagnosis Date  . Left inguinal hernia   . Right inguinal hernia   . Hypertension   . Hyperlipidemia   . Asthma   . Allergy   . Postsurgical aortocoronary bypass status   . CAD (coronary artery disease)     Medications:  Prescriptions prior to admission  Medication Sig Dispense Refill  . acetaminophen (TYLENOL) 500 MG tablet Take 500 mg by mouth every 6 (six) hours as needed. For pain/fever      . aspirin 325 MG tablet Take 325 mg by mouth daily.        Marland Kitchen atorvastatin (LIPITOR) 20 MG tablet Take 1 tablet (20 mg total) by mouth daily.  90 tablet  3  . cetirizine (ZYRTEC) 10 MG tablet Take 10 mg by mouth daily.      . Melatonin 3 MG TABS Take 1 tablet by mouth at bedtime as needed. For sleep        Assessment: 51 y/o male patient admitted with acetabular fracture, now s/p ORIF requiring coumadin for dvt prophylaxis. Baseline INR wnl. No drug interactions identified.  Goal of Therapy:  INR 2-3 Monitor platelets by anticoagulation protocol: Yes   Plan:   Coumadin 7.5mg  today and f/u daily protime.  Verlene Mayer, PharmD, BCPS Pager [phone] 09/09/2012,9:24 AM

## 2012-09-09 NOTE — Progress Notes (Addendum)
Simulation note, treatment planning note  Inpatient  Diagnosis: right hip fracture  Prophylaxis against heterotopic ossification  The patient was laid in the supine position on the CT simulation table. High-resolution CT axial imaging was obtained of his pelvis. An isocenter was placed in the right hip. Skin markings are made and he tolerated this well.  Treatment planning- I will treat the patient's right hip with an AP PA arrangement using wedges and MLCs for each of the fields. 10 MV photons will be used. I am prescribing 7 Gy in 1 fraction.  -----------------------------------  Desyre Calma, MD 

## 2012-09-09 NOTE — Progress Notes (Signed)
Still has a very small apical PTX.  CT on waterseal okay.   May have to pull on suction.  This patient has been seen and I agree with the findings and treatment plan.  Marta Lamas. Gae Bon, MD, FACS [phone] (pager) [phone] (direct pager) Trauma Surgeon

## 2012-09-09 NOTE — Progress Notes (Signed)
Disposition to [facility] for transport to 5N at [facility].  Accompanied by Lona Kettle

## 2012-09-09 NOTE — Progress Notes (Signed)
Physical Therapy Evaluation Patient Details Name: [name] MRN: [id number] DOB: [birth year] Today's Date: 09/09/2012 Time: 4098-1191 PT Time Calculation (min): 48 min  PT Assessment / Plan / Recommendation Clinical Impression  Pt is 51 yo male s/o fall from roof who sustained a right hip fx, L2-4 TP fxs, and left pneumothorax. He presents with mobility limitations due to pain, precautions, and acute injury. PT will follow acutely to help strengthen the RLE and increase ability to transfer and ambulate safely. Recommend CIR consult.    PT Assessment  Patient needs continued PT services    Follow Up Recommendations  CIR    Does the patient have the potential to tolerate intense rehabilitation      Barriers to Discharge Inaccessible home environment 3 steps to enter home    Equipment Recommendations  Rolling walker with 5" wheels;Wheelchair (measurements)    Recommendations for Other Services Rehab consult   Frequency Min 5X/week    Precautions / Restrictions Precautions Precautions: Posterior Hip Precaution Booklet Issued: Yes (comment) Precaution Comments: precautions explained Restrictions Weight Bearing Restrictions: Yes RLE Weight Bearing: Touchdown weight bearing Other Position/Activity Restrictions: LLE WBAT, also reviewed log rolling for protection of lumbar fxs   Pertinent Vitals/Pain Faces 8/10, right hip with mvmt, PCA used by pt      Mobility  Bed Mobility Bed Mobility: Supine to Sit;Sit to Supine;Sitting - Scoot to Edge of Bed Supine to Sit: 1: +2 Total assist;With rails;HOB elevated Supine to Sit: Patient Percentage: 50% Sitting - Scoot to Edge of Bed: 3: Mod assist Sit to Supine: 1: +2 Total assist Sit to Supine: Patient Percentage: 30% Details for Bed Mobility Assistance: vc's for sequencing, right LE supported, assistance given at trunk, vc's for breathing Transfers Transfers: Sit to Stand;Stand to Sit Sit to Stand: 1: +2 Total assist;From  bed;From elevated surface;With upper extremity assist Sit to Stand: Patient Percentage: 60% Stand to Sit: 1: +2 Total assist;To bed;To elevated surface;With upper extremity assist Stand to Sit: Patient Percentage: 60% Details for Transfer Assistance: did not transfer to chair because pt preparing to go to [facility] for radiation treatment. (A) to help pt wt-shift fwd for standing. OT's foot under pt's right heel to assure TDWB. Allowed pt to push through RW this session with bed elevated. Ambulation/Gait Ambulation/Gait Assistance: Not tested (comment) Stairs: No Wheelchair Mobility Wheelchair Mobility: No    Shoulder Instructions     Exercises General Exercises - Lower Extremity Ankle Circles/Pumps: AROM;Both;10 reps;Supine Quad Sets: AROM;Right;10 reps;Supine;Limitations Quad Sets Limitations: quad tremor with contraction   PT Diagnosis: Difficulty walking;Acute pain;Other (comment) (acute weakness)  PT Problem List: Decreased strength;Decreased range of motion;Decreased activity tolerance;Decreased balance;Decreased mobility;Decreased coordination;Decreased knowledge of use of DME;Decreased knowledge of precautions;Pain PT Treatment Interventions: DME instruction;Gait training;Functional mobility training;Therapeutic activities;Therapeutic exercise;Balance training;Neuromuscular re-education;Patient/family education   PT Goals Acute Rehab PT Goals PT Goal Formulation: With patient/family Time For Goal Achievement: 09/23/12 Potential to Achieve Goals: Good Pt will go Supine/Side to Sit: with min assist PT Goal: Supine/Side to Sit - Progress: Goal set today Pt will go Sit to Supine/Side: with min assist PT Goal: Sit to Supine/Side - Progress: Goal set today Pt will go Sit to Stand: with min assist PT Goal: Sit to Stand - Progress: Goal set today Pt will go Stand to Sit: with min assist PT Goal: Stand to Sit - Progress: Goal set today Pt will Transfer Bed to Chair/Chair to Bed: with min  assist PT Transfer Goal: Bed to Chair/Chair to Bed - Progress: Goal  set today Pt will Ambulate: 1 - 15 feet;with min assist;with rolling walker PT Goal: Ambulate - Progress: Goal set today Pt will Perform Home Exercise Program: with supervision, verbal cues required/provided PT Goal: Perform Home Exercise Program - Progress: Goal set today  Visit Information  Last PT Received On: 09/09/12 Assistance Needed: +2 PT/OT Co-Evaluation/Treatment: Yes    Subjective Data  Subjective: pt ready to get up Patient Stated Goal: return to home and work   Prior Functioning  Home Living Lives With: Spouse Available Help at Discharge: Available 24 hours/day Type of Home: House Home Access: Stairs to enter [employer] of Steps: 3 Entrance Stairs-Rails: None Home Layout: Two level;Able to live on main level with bedroom/bathroom Alternate Level Stairs-Number of Steps: 14 Bathroom Shower/Tub: Walk-in shower;Door Teacher, early years/pre: Yes How Accessible: Accessible via walker Home Adaptive Equipment: None Prior Function Level of Independence: Independent Able to Take Stairs?: Yes Driving: Yes Vocation: Full time employment Comments: Acupuncturist, 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, PTA was traveling by plane fairly often Communication Communication: No difficulties Dominant Hand: Right    Cognition  Overall Cognitive Status: Appears within functional limits for tasks assessed/performed Arousal/Alertness: Awake/alert Orientation Level: Appears intact for tasks assessed Behavior During Session: [facility] for tasks performed    Extremity/Trunk Assessment Right Upper Extremity Assessment RUE ROM/Strength/Tone: WFL for tasks assessed RUE Sensation: WFL - Light Touch;WFL - Proprioception RUE Coordination: WFL - gross/fine motor Left Upper Extremity Assessment LUE ROM/Strength/Tone: Deficits;Due to pain LUE Sensation: WFL - Light Touch;WFL -  Proprioception LUE Coordination: WFL - gross/fine motor Right Lower Extremity Assessment RLE ROM/Strength/Tone: Deficits;Due to pain;Due to precautions RLE ROM/Strength/Tone Deficits: hip flex 1/5, knee ext 2/5, keeps bilateral hips in external rotation (PTA) RLE Sensation: WFL - Light Touch RLE Coordination: Deficits RLE Coordination Deficits: decreased ability to move leg Left Lower Extremity Assessment LLE ROM/Strength/Tone: Within functional levels LLE Sensation: WFL - Light Touch;WFL - Proprioception LLE Coordination: WFL - gross motor Trunk Assessment Trunk Assessment: Other exceptions Trunk Exceptions: TP fxs L2-4   Balance Balance Balance Assessed: Yes Static Sitting Balance Static Sitting - Balance Support: Feet supported;Bilateral upper extremity supported Static Sitting - Level of Assistance: 4: Min assist;5: Stand by assistance  End of Session PT - End of Session Equipment Utilized During Treatment: Gait belt;Oxygen;Other (comment) (chest tube left) Activity Tolerance: Patient tolerated treatment well Patient left: in bed;with call bell/phone within reach;with family/visitor present Nurse Communication: Mobility status  GP   Lyanne Co, PT  Acute Rehab Services  [phone]   [place], [place] 09/09/2012, 1:22 PM

## 2012-09-09 NOTE — Progress Notes (Signed)
4:03 pm Simulation complete.  Lying down more comfortably in regards to hip.  Dressing is dry and note palpable pedal pulse on his right leg. Presently his temperature is 100.2.  Waiting on treatment plan completion prior to treatment.  His spouse, [name] is in attendance.   4:25pm  [name] given Tylenol  650 mg as ordered by Dr. Basilio Cairo for temp of 100.0.  4:44pm [name] given Oxycodone 15 mg po as needed for his hip pain at a level of 8-10 on a scale of 0-10.  Lying on padded stretcher waiting on his treatment.

## 2012-09-09 NOTE — Consult Note (Signed)
Physical Medicine and Rehabilitation Consult Reason for Consult: Multitrauma after a fall Referring Physician: Trauma services   HPI: [name] is a 51 y.o. right-handed male admitted 09/05/2012 after a fall from roof while cleaning leaves. There was no report of loss of consciousness. Patient noted immediate back pain and right hip pain. Cranial CT scan was negative. X-rays and imaging revealed a left pneumothorax, pneumomediastinum, right hip dislocation with posterior acetabular fracture as well as lumbar 2-4 left transverse process fracture and left flank hematoma. A chest tube was inserted. Patient underwent ORIF for right acetabular fracture 09/08/2012 per Dr. [name]. Advised touchdown weightbearing to right lower extremity and posterior hip cautions. Placed on Coumadin for DVT prophylaxis. Chest tube removed 09/10/2012 . Scheduled to get her radiation for HO prophylaxis 09/09/2012. Physical and occupational therapy evaluations completed an ongoing with recommendations of physical medicine rehabilitation consult to consider inpatient rehabilitation services   Review of Systems  Musculoskeletal: Positive for myalgias.  All other systems reviewed and are negative.   Past Medical History  Diagnosis Date  . Left inguinal hernia   . Right inguinal hernia   . Hypertension   . Hyperlipidemia   . Asthma   . Allergy   . Postsurgical aortocoronary bypass status   . CAD (coronary artery disease)    Past Surgical History  Procedure Date  . Double coronary artery bypass   . Hernia repair     left and right inguinal  . Orif acetabular fracture 09/08/2012    Procedure: OPEN REDUCTION INTERNAL FIXATION (ORIF) ACETABULAR FRACTURE;  Surgeon: Budd Palmer, MD;  Location: [facility] OR;  Service: Orthopedics;  Laterality: Right;   Family History  Problem Relation Age of Onset  . Hypertension Mother   . Hyperlipidemia Mother   . Hypertension Father   . Hyperlipidemia Father    Social History:   reports that he has never smoked. He does not have any smokeless tobacco history on file. He reports that he does not drink alcohol or use illicit drugs. Allergies:  Allergies  Allergen Reactions  . Cefaclor     REACTION: rash   Medications Prior to Admission  Medication Sig Dispense Refill  . acetaminophen (TYLENOL) 500 MG tablet Take 500 mg by mouth every 6 (six) hours as needed. For pain/fever      . aspirin 325 MG tablet Take 325 mg by mouth daily.        Marland Kitchen atorvastatin (LIPITOR) 20 MG tablet Take 1 tablet (20 mg total) by mouth daily.  90 tablet  3  . cetirizine (ZYRTEC) 10 MG tablet Take 10 mg by mouth daily.      . Melatonin 3 MG TABS Take 1 tablet by mouth at bedtime as needed. For sleep        Home: Home Living Lives With: Spouse Available Help at Discharge: Available 24 hours/day Type of Home: House Home Access: Stairs to enter [employer] of Steps: 3 Entrance Stairs-Rails: None Home Layout: Two level;Able to live on main level with bedroom/bathroom Alternate Level Stairs-Number of Steps: 14 Bathroom Shower/Tub: Walk-in shower;Door [employer] Toilet: Standard Bathroom Accessibility: Yes How Accessible: Accessible via walker Home Adaptive Equipment: None  Functional History: Prior Function Able to Take Stairs?: Yes Driving: Yes Vocation: Full time employment Comments: Acupuncturist, 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, PTA was traveling by plane fairly often Functional Status:  Mobility: Bed Mobility Bed Mobility: Supine to Sit;Sit to Supine;Sitting - Scoot to Edge of Bed Supine to Sit: 1: +2 Total  assist;With rails;HOB elevated Supine to Sit: Patient Percentage: 50% Sitting - Scoot to Edge of Bed: 3: Mod assist Sit to Supine: 1: +2 Total assist Sit to Supine: Patient Percentage: 30% Transfers Transfers: Sit to Stand;Stand to Sit Sit to Stand: 1: +2 Total assist;From bed;From elevated surface;With upper extremity assist Sit to Stand: Patient  Percentage: 60% Stand to Sit: 1: +2 Total assist;To bed;To elevated surface;With upper extremity assist Stand to Sit: Patient Percentage: 60% Ambulation/Gait Ambulation/Gait Assistance: Not tested (comment) Stairs: No Wheelchair Mobility Wheelchair Mobility: No  ADL: ADL Eating/Feeding: Independent Grooming: Supervision/safety;Set up Where Assessed - Grooming: Supported sitting Upper Body Bathing: Minimal assistance Where Assessed - Upper Body Bathing: Supported sitting Lower Body Bathing: +1 Total assistance Where Assessed - Lower Body Bathing: Supported sit to stand Upper Body Dressing: Minimal assistance Where Assessed - Upper Body Dressing: Supported sitting Lower Body Dressing: +1 Total assistance Where Assessed - Lower Body Dressing: Supported sit to Pharmacist, hospital: +2 Total assistance Toilet Transfer Method: Sit to Barista:  (sit - stand) Equipment Used: Gait belt Transfers/Ambulation Related to ADLs: +2 total A ADL Comments: no knowledge of hip precautions and AE. limited by precautions and pain  Cognition: Cognition Arousal/Alertness: Awake/alert Orientation Level: Oriented X4 Cognition Overall Cognitive Status: Appears within functional limits for tasks assessed/performed Arousal/Alertness: Awake/alert Orientation Level: Appears intact for tasks assessed Behavior During Session: [facility] for tasks performed  Blood pressure 131/64, pulse 71, temperature 98.9 F (37.2 C), temperature source Oral, resp. rate 14, height 5\' 9"  (1.753 m), weight 74.707 kg (164 lb 11.2 oz), SpO2 97.00%. Physical Exam  Vitals reviewed. Constitutional: He is oriented to person, place, and time. He appears well-developed.  HENT:  Head: Normocephalic.  Eyes:       Pupils round and reactive to light  Neck: Neck supple. No thyromegaly present.  Cardiovascular: Normal rate and regular rhythm.   Pulmonary/Chest: Breath sounds normal. No respiratory distress.   Abdominal: Soft. Bowel sounds are normal. He exhibits no distension.  Musculoskeletal:       Right hip dressed. Leg appropriately tender.   Neurological: He is alert and oriented to person, place, and time. No cranial nerve deficit.       UE 5/5. LLE is 3 prox to 4/5 distally RLE is 1-2 prox to 3-4 distally. No sensory deficits.   Skin:       Chest tube site is dressed.  Psychiatric: He has a normal mood and affect. His behavior is normal. Judgment and thought content normal.    Results for orders placed during the hospital encounter of 09/05/12 (from the past 24 hour(s))  CBC     Status: Abnormal   Collection Time   09/09/12  5:30 AM      Component Value Range   WBC 6.7  4.0 - 10.5 K/uL   RBC 2.64 (*) 4.22 - 5.81 MIL/uL   Hemoglobin 8.2 (*) 13.0 - 17.0 g/dL   HCT 27.2 (*) 53.6 - 64.4 %   MCV 90.2  78.0 - 100.0 fL   MCH 31.1  26.0 - 34.0 pg   MCHC 34.5  30.0 - 36.0 g/dL   RDW 03.4  74.2 - 59.5 %   Platelets 142 (*) 150 - 400 K/uL  BASIC METABOLIC PANEL     Status: Abnormal   Collection Time   09/09/12  5:30 AM      Component Value Range   Sodium 135  135 - 145 mEq/L   Potassium 4.2  3.5 -  5.1 mEq/L   Chloride 102  96 - 112 mEq/L   CO2 28  19 - 32 mEq/L   Glucose, Bld 114 (*) 70 - 99 mg/dL   BUN 15  6 - 23 mg/dL   Creatinine, Ser 9.14  0.50 - 1.35 mg/dL   Calcium 7.8 (*) 8.4 - 10.5 mg/dL   GFR calc non Af Amer >90  >90 mL/min   GFR calc Af Amer >90  >90 mL/min  URINALYSIS, ROUTINE W REFLEX MICROSCOPIC     Status: Normal   Collection Time   09/09/12  9:09 AM      Component Value Range   Color, Urine YELLOW  YELLOW   APPearance CLEAR  CLEAR   Specific Gravity, Urine 1.026  1.005 - 1.030   pH 6.5  5.0 - 8.0   Glucose, UA NEGATIVE  NEGATIVE mg/dL   Hgb urine dipstick NEGATIVE  NEGATIVE   Bilirubin Urine NEGATIVE  NEGATIVE   Ketones, ur NEGATIVE  NEGATIVE mg/dL   Protein, ur NEGATIVE  NEGATIVE mg/dL   Urobilinogen, UA 1.0  0.0 - 1.0 mg/dL   Nitrite NEGATIVE  NEGATIVE    Leukocytes, UA NEGATIVE  NEGATIVE   Dg Hip Operative Right  09/08/2012  *RADIOLOGY REPORT*  Clinical Data: Right acetabular fracture ORIF.  DG OPERATIVE RIGHT HIP  Comparison: Pelvic CT 09/05/2012.  Findings: Four spot fluoroscopic images demonstrate plate and screw fixation of the posterior right acetabular fracture.  No displaced fracture fragments are identified.  There is no dislocation of the femoral head.  IMPRESSION: Intraoperative views during right acetabular fracture ORIF.   Original Report Authenticated By: Carey Bullocks, M.D.    Dg Pelvis Comp Min 3v  09/08/2012  *RADIOLOGY REPORT*  Clinical Data: Postop views from a right acetabular fracture ORIF  JUDET PELVIS - 3+ VIEW  Comparison: Pelvis CT, 09/05/2012  Findings: The posterior wall fracture of the right acetabulum has been reduced with a fixation plate and five fixation screws.  The femoral head is normally aligned in the acetabulum.   There are overlying soft tissue skin staples.  IMPRESSION: Status post ORIF of a displaced right posterior acetabular fracture.  The fracture is well reduced with a fixation plate and screws.  No evidence of an operative complication.   Original Report Authenticated By: Amie Portland, M.D.    Dg Chest Port 1 View  09/09/2012  *RADIOLOGY REPORT*  Clinical Data: Pneumothorax.  Trauma.  PORTABLE CHEST - 1 VIEW  Comparison: One-view chest 09/08/2012.  Findings: A left-sided chest tube is in place.  The left apical pneumothorax is decreasing, but not entirely resolved.  The lungs are otherwise clear.  The visualized soft tissues and bony thorax are unremarkable.  IMPRESSION:  1.  Improving left apical pneumothorax. 2.  Stable left-sided chest tube.   Original Report Authenticated By: Marin Roberts, M.D.    Dg Chest Port 1 View  09/08/2012  *RADIOLOGY REPORT*  Clinical Data: Follow up pneumothorax.  PORTABLE CHEST - 1 VIEW  Comparison: 09/07/2012  Findings: The left chest tube remains in place.  Small left  apical pneumothorax.  No significant pleural effusion.  No heart failure. Prior CABG.  IMPRESSION: Small left apical pneumothorax.   Original Report Authenticated By: Janeece Riggers, M.D.     Assessment/Plan: Diagnosis: right hip dislocation with acetabular fx after fall. ptx 1. Does the need for close, 24 hr/day medical supervision in concert with the patient's rehab needs make it unreasonable for this patient to be served in a less  intensive setting? Yes 2. Co-Morbidities requiring supervision/potential complications: hx cad, htn, osa 3. Due to bladder management, bowel management, safety, skin/wound care, disease management, medication administration, pain management and patient education, does the patient require 24 hr/day rehab nursing? Yes 4. Does the patient require coordinated care of a physician, rehab nurse, PT (1-2 hrs/day, 5 days/week) and OT (1-2 hrs/day, 5 days/week) to address physical and functional deficits in the context of the above medical diagnosis(es)? Yes Addressing deficits in the following areas: balance, endurance, locomotion, strength, transferring, bowel/bladder control, bathing, dressing, feeding, grooming, toileting and psychosocial support 5. Can the patient actively participate in an intensive therapy program of at least 3 hrs of therapy per day at least 5 days per week? Yes 6. The potential for patient to make measurable gains while on inpatient rehab is excellent 7. Anticipated functional outcomes upon discharge from inpatient rehab are supervision with PT, supervision to min assist with OT, n/a with SLP. 8. Estimated rehab length of stay to reach the above functional goals is: 1-2 weeks 9. Does the patient have adequate social supports to accommodate these discharge functional goals? Yes 10. Anticipated D/C setting: Home 11. Anticipated post D/C treatments: HH therapy 12. Overall Rehab/Functional Prognosis: excellent  RECOMMENDATIONS: This patient's condition is  appropriate for continued rehabilitative care in the following setting: CIR Patient has agreed to participate in recommended program. Yes Note that insurance prior authorization may be required for reimbursement for recommended care.  Comment: He likely needs inpatient rehab so that he and his wife can manage at home. Would like to see how he fares today with PT first. Rehab RN to follow up.   Ivory Broad, MD     09/09/2012

## 2012-09-09 NOTE — Progress Notes (Signed)
Simulation Verification Note - R hip  The patient was brought to the treatment unit and placed in the planned treatment position. The clinical setup was verified. Then port films were obtained and uploaded to the radiation oncology medical record software.  The treatment beams were carefully compared against the planned radiation fields. The position location and shape of the radiation fields was reviewed. They targeted volume of tissue appears to be appropriately covered by the radiation beams. Organs at risk appear to be excluded as planned.  Based on my personal review, I approved the simulation verification. The patient's treatment will proceed as planned.  -----------------------------------  Lonie Peak, MD
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
## 2012-09-09 NOTE — Progress Notes (Signed)
[facility] (OTS)  Subjective: 1 Day Post-Op Procedure(s) (LRB): OPEN REDUCTION INTERNAL FIXATION (ORIF) ACETABULAR FRACTURE (Right) Doing well Pain controlled Scheduled to go to XRT today for HO prophylaxis  Therapies likely to be delayed today due to XRT  Denies numbness or tingling in R leg No SOB, No CP   Objective: Current Vitals Blood pressure 131/64, pulse 71, temperature 98.9 F (37.2 C), temperature source Oral, resp. rate 16, height 5\' 9"  (1.753 m), weight 74.707 kg (164 lb 11.2 oz), SpO2 99.00%. Vital signs in last 24 hours: Temp:  [97.5 F (36.4 C)-99.3 F (37.4 C)] 98.9 F (37.2 C) (12/04 0516) Pulse Rate:  [58-76] 71  (12/04 0516) Resp:  [8-20] 16  (12/04 0516) BP: (107-150)/(45-70) 131/64 mmHg (12/04 0516) SpO2:  [98 %-100 %] 99 % (12/04 0516)  Intake/Output from previous day: 12/03 0701 - 12/04 0700 In: 3140 [P.O.:240; I.V.:2300; Blood:350; IV Piggyback:250] Out: 2850 [Urine:2150; Blood:700] Intake/Output      12/03 0701 - 12/04 0700 12/04 0701 - 12/05 0700   P.O. 240    I.V. (mL/kg) 2300 (30.8)    Blood 350    IV Piggyback 250    Total Intake(mL/kg) 3140 (42)    Urine (mL/kg/hr) 2150 (1.2)    Blood 700    Total Output 2850    Net +290            LABS  Basename 09/09/12 0530 09/08/12 1138 09/08/12 1043 09/08/12 0555 09/07/12 1520  HGB 8.2* 7.8* 6.5* 9.1* 8.3*    Basename 09/09/12 0530 09/08/12 1138 09/08/12 0555  WBC 6.7 -- 8.2  RBC 2.64* -- 2.94*  HCT 23.8* 23.0* --  PLT 142* -- 136*    Basename 09/09/12 0530 09/08/12 1138 09/08/12 0555  NA 135 136 --  K 4.2 4.4 --  CL 102 -- 104  CO2 28 -- 29  BUN 15 -- 10  CREATININE 0.80 -- 0.83  GLUCOSE 114* 119* --  CALCIUM 7.8* -- 8.1*    Basename 09/07/12 1520  LABPT --  INR 1.09     Physical Exam  Gen: awake and alert Lungs: Clear bilaterally Cardiac: S1 and S2 Abd:+ bowel sounds, nontender Pelvis: Dressing stable to right hip minimal drainage Ext:       Right lower extremity  Deep peroneal nerve, superficial peroneal nerve, tibial nerve sensory functions are intact   EHL, FHL, anterior tibialis, posterior tibialis, peroneals and gastrocsoleus complex motor function are intact  Swelling is stable  Palpable dorsalis pedis pulse     Assessment/Plan: 1 Day Post-Op Procedure(s) (LRB): OPEN REDUCTION INTERNAL FIXATION (ORIF) ACETABULAR FRACTURE (Right)  1. Fall 2. Right posterior wall acetabular fracture dislocation  Postop day #1   Patient will be touchdown weightbearing on his right leg for the next 8 weeks with progressive weightbearing thereafter  Posterior hip precautions to the right hip  Patient to commence therapies after radiation therapy  Dressing change tomorrow  Ice as needed  Discontinue knee immobilizer and abduction pillow   Patient continues pillow between the legs while at rest to help with hip precautions   3. Left pneumothorax   Per trauma service  Chest tube still in place 4. left shoulder pain  Likely muscular contusion  X-rays and CT scan negative for acute fracture 5. acute blood loss anemia  Patient received 2 units intraoperatively, H. and H. are currently stable but will recheck in the morning suspect patient will need additional blood given his cardiac history  6. DVT and PE  prophylaxis  Lovenox bridge to Coumadin   Will start Coumadin today   patient will need to 8 weeks of Coumadin therapy given his acetabular injury  TED hose to right leg 7. Pain  PCA be continued until patient returns from XRT  Encouraged oral medications  Will reorder IV Tylenol  8. FEN   Advance diet as tolerated  9. disposition   XRT today   Begin therapies ASAP    Touchdown weightbearing right leg with posterior hip precautions    Suspect stable for discharge Friday or Saturday. The patient will likely be able to discharge to home with home health   Mearl Latin, PA-C [facility] [phone]  (P) 09/09/2012, 9:04 AM

## 2012-09-10 ENCOUNTER — Inpatient Hospital Stay (HOSPITAL_COMMUNITY): Payer: BC Managed Care – PPO

## 2012-09-10 DIAGNOSIS — S270XXA Traumatic pneumothorax, initial encounter: Secondary | ICD-10-CM

## 2012-09-10 DIAGNOSIS — W138XXA Fall from, out of or through other building or structure, initial encounter: Secondary | ICD-10-CM

## 2012-09-10 DIAGNOSIS — S329XXA Fracture of unspecified parts of lumbosacral spine and pelvis, initial encounter for closed fracture: Secondary | ICD-10-CM

## 2012-09-10 LAB — CBC
HCT: 26.7 % — ABNORMAL LOW (ref 39.0–52.0)
Hemoglobin: 9.2 g/dL — ABNORMAL LOW (ref 13.0–17.0)
MCH: 31.2 pg (ref 26.0–34.0)
MCHC: 34.5 g/dL (ref 30.0–36.0)
MCV: 90.5 fL (ref 78.0–100.0)
Platelets: 185 10*3/uL (ref 150–400)
RBC: 2.95 MIL/uL — ABNORMAL LOW (ref 4.22–5.81)
RDW: 13.5 % (ref 11.5–15.5)
WBC: 7.9 10*3/uL (ref 4.0–10.5)

## 2012-09-10 LAB — PROTIME-INR
INR: 1.11 (ref 0.00–1.49)
Prothrombin Time: 14.2 seconds (ref 11.6–15.2)

## 2012-09-10 MED ORDER — HYDROCODONE-ACETAMINOPHEN 10-325 MG PO TABS
1.0000 | ORAL_TABLET | ORAL | Status: DC | PRN
  Administered 2012-09-11: 2 via ORAL
  Administered 2012-09-11: 1 via ORAL
  Filled 2012-09-10: qty 1
  Filled 2012-09-10: qty 2

## 2012-09-10 MED ORDER — WARFARIN SODIUM 7.5 MG PO TABS
7.5000 mg | ORAL_TABLET | Freq: Once | ORAL | Status: DC
  Administered 2012-09-10: 7.5 mg via ORAL
  Filled 2012-09-10: qty 1

## 2012-09-10 NOTE — Progress Notes (Signed)
I saw and examined the patient with [name], communicating the findings and plan noted above.  [name] Orrego, MD [facility] [phone] (p)  

## 2012-09-10 NOTE — Progress Notes (Signed)
Feeling much better with CT out.  Up to chair with PT.  Had some dizziness. Patient examined and I agree with the assessment and plan  Violeta Gelinas, MD, MPH, FACS Pager: [phone]  09/10/2012 12:06 PM

## 2012-09-10 NOTE — Clinical Social Work Psychosocial (Signed)
     Clinical Social Work Department BRIEF PSYCHOSOCIAL ASSESSMENT 09/10/2012  Patient:  [name], [name]     Account Number:  [id number]     Admit date:  09/05/2012  Clinical Social Worker:  Pearson Forster  Date/Time:  09/10/2012 03:30 PM  Referred by:  Physician  Date Referred:  09/10/2012 Referred for  Psychosocial assessment   Other Referral:   Interview type:  Patient Other interview type:   Patient family at bedside    PSYCHOSOCIAL DATA Living Status:  FAMILY Admitted from facility:   Level of care:   Primary support name:  Nadeem, Romanoski   [phone]  [phone] Primary support relationship to patient:  SPOUSE Degree of support available:   Strong    CURRENT CONCERNS Current Concerns  None Noted   Other Concerns:   Possible need for completion of short term disability paperwork    SOCIAL WORK ASSESSMENT / PLAN Clinical Social Worker spoke with patient at bedside to offer support and discuss patient needs at discharge. Patient states that he was at home cleaning out his gutters when he fell off the roof.  Patient does not remember much of the event.  Patient currently lives at home with his wife and has support from his parents who are currently at bedside.  Patient is hopeful for insurance approval for inpatient rehab here at [facility].  Inpatient rehab admissions coordinator is working on prior approval from [place] .    Patient requested assistance with possible short term disability paperwork for his current employer.  Patient is going to contact his employer and fax documents directly to CSW - CSW provided appropriate contact information.  CSW inquired about current substance use - patient with no current concerns at this time and no alcohol involved in the fall.  SBIRT complete.  No resources needed at this time.  CSW signing off at this time - please reconsult if further needs arise.   Assessment/plan status:  No Further Intervention Required Other assessment/  plan:   Information/referral to community resources:   Visual merchandiser provided patient with CSW contact information for assistance with short term disability documents.    PATIENTS/FAMILYS RESPONSE TO PLAN OF CARE: Patient alert and oriented x3 laying in the bed.  Patient is hopeful for placement at inpatient rehab here at [facility] and is awaiting prior approval from [facility].  Patient has strong family support and will be available for assistance as needed once patient is able to discharge home.  Patient verbalized his appreciation for CSW support and involvement.

## 2012-09-10 NOTE — Progress Notes (Signed)
ANTICOAGULATION CONSULT NOTE - Follow up Consult  Pharmacy Consult for Coumadin Indication: VTE prophylaxis  Allergies  Allergen Reactions  . Cefaclor     REACTION: rash    Patient Measurements: Height: 5\' 9"  (175.3 cm) Weight: 164 lb 11.2 oz (74.707 kg) IBW/kg (Calculated) : 70.7   Vital Signs: Temp: 97.9 F (36.6 C) (12/05 0559) BP: 120/65 mmHg (12/05 0559) Pulse Rate: 65  (12/05 0559)  Labs:  Basename 09/10/12 0625 09/09/12 0530 09/08/12 1138 09/08/12 0555 09/07/12 1520  HGB 9.2* 8.2* -- -- --  HCT 26.7* 23.8* 23.0* -- --  PLT 185 142* -- 136* --  APTT -- -- -- -- 32  LABPROT 14.2 -- -- -- 14.0  INR 1.11 -- -- -- 1.09  HEPARINUNFRC -- -- -- -- --  CREATININE -- 0.80 -- 0.83 0.98  CKTOTAL -- -- -- -- --  CKMB -- -- -- -- --  TROPONINI -- -- -- -- --    Estimated Creatinine Clearance: 106.8 ml/min (by C-G formula based on Cr of 0.8).   Medical History: Past Medical History  Diagnosis Date  . Left inguinal hernia   . Right inguinal hernia   . Hypertension   . Hyperlipidemia   . Asthma   . Allergy   . Postsurgical aortocoronary bypass status   . CAD (coronary artery disease)     Medications:  Prescriptions prior to admission  Medication Sig Dispense Refill  . acetaminophen (TYLENOL) 500 MG tablet Take 500 mg by mouth every 6 (six) hours as needed. For pain/fever      . aspirin 325 MG tablet Take 325 mg by mouth daily.        Marland Kitchen atorvastatin (LIPITOR) 20 MG tablet Take 1 tablet (20 mg total) by mouth daily.  90 tablet  3  . cetirizine (ZYRTEC) 10 MG tablet Take 10 mg by mouth daily.      . Melatonin 3 MG TABS Take 1 tablet by mouth at bedtime as needed. For sleep        Assessment: 51 y/o male patient admitted with acetabular fracture, now s/p ORIF requiring coumadin for dvt prophylaxis. Baseline INR wnl. No drug interactions identified. No bleeding reported.  Goal of Therapy:  INR 2-3 Monitor platelets by anticoagulation protocol: Yes   Plan:   Repeat Coumadin 7.5mg  today and f/u daily protime. Continue lovenox until INR>2.  Verlene Mayer, PharmD, BCPS Pager [phone] 09/10/2012,10:23 AM

## 2012-09-10 NOTE — Progress Notes (Signed)
LOS: 5 days   Subjective: Pt doing okay today.  Pain well controlled, excited about getting his chest tube out today.  Pt states yesterday was not a good day because of having to be moved so much to and from [facility] for radiation.  Pt urinating well, no BM yet, tolerating diet.    Objective: Vital signs in last 24 hours: Temp:  [97.9 F (36.6 C)-100.7 F (38.2 C)] 97.9 F (36.6 C) (12/05 0559) Pulse Rate:  [64-73] 65  (12/05 0559) Resp:  [14-18] 18  (12/05 0559) BP: (120-152)/(55-74) 120/65 mmHg (12/05 0559) SpO2:  [97 %-100 %] 99 % (12/05 0559) Last BM Date:  (prior to admission)  Lab Results:  CBC  Basename 09/10/12 0625 09/09/12 0530  WBC 7.9 6.7  HGB 9.2* 8.2*  HCT 26.7* 23.8*  PLT 185 142*   BMET  Basename 09/09/12 0530 09/08/12 1138 09/08/12 0555  NA 135 136 --  K 4.2 4.4 --  CL 102 -- 104  CO2 28 -- 29  GLUCOSE 114* 119* --  BUN 15 -- 10  CREATININE 0.80 -- 0.83  CALCIUM 7.8* -- 8.1*    Imaging: Dg Hip Operative Right  09/08/2012  *RADIOLOGY REPORT*  Clinical Data: Right acetabular fracture ORIF.  DG OPERATIVE RIGHT HIP  Comparison: Pelvic CT 09/05/2012.  Findings: Four spot fluoroscopic images demonstrate plate and screw fixation of the posterior right acetabular fracture.  No displaced fracture fragments are identified.  There is no dislocation of the femoral head.  IMPRESSION: Intraoperative views during right acetabular fracture ORIF.   Original Report Authenticated By: Carey Bullocks, M.D.    Dg Pelvis Comp Min 3v  09/08/2012  *RADIOLOGY REPORT*  Clinical Data: Postop views from a right acetabular fracture ORIF  JUDET PELVIS - 3+ VIEW  Comparison: Pelvis CT, 09/05/2012  Findings: The posterior wall fracture of the right acetabulum has been reduced with a fixation plate and five fixation screws.  The femoral head is normally aligned in the acetabulum.   There are overlying soft tissue skin staples.  IMPRESSION: Status post ORIF of a displaced right posterior  acetabular fracture.  The fracture is well reduced with a fixation plate and screws.  No evidence of an operative complication.   Original Report Authenticated By: Amie Portland, M.D.    Dg Chest Port 1 View  09/09/2012  *RADIOLOGY REPORT*  Clinical Data: Pneumothorax.  Trauma.  PORTABLE CHEST - 1 VIEW  Comparison: One-view chest 09/08/2012.  Findings: A left-sided chest tube is in place.  The left apical pneumothorax is decreasing, but not entirely resolved.  The lungs are otherwise clear.  The visualized soft tissues and bony thorax are unremarkable.  IMPRESSION:  1.  Improving left apical pneumothorax. 2.  Stable left-sided chest tube.   Original Report Authenticated By: Marin Roberts, M.D.    Dg Chest Port 1 View  09/08/2012  *RADIOLOGY REPORT*  Clinical Data: Follow up pneumothorax.  PORTABLE CHEST - 1 VIEW  Comparison: 09/07/2012  Findings: The left chest tube remains in place.  Small left apical pneumothorax.  No significant pleural effusion.  No heart failure. Prior CABG.  IMPRESSION: Small left apical pneumothorax.   Original Report Authenticated By: Janeece Riggers, M.D.     General appearance: alert, cooperative and no distress Resp: clear to auscultation bilaterally Cardio: regular rate and rhythm, S1, S2 normal, no murmur, click, rub or gallop  Assessment/Plan: Fall  Left PTX s/p CT -- CXR looked good this am, removed chest tube and placed vaseline gauze  dressing, will repeat CXR tomorrow Right acet fx/dislocation s/p ORIF -- s/p radiation at [facility] yesterday Mult lumbar TVP fxs  ABL anemia -- Improved  Mult med probs -- Home meds  FEN -- Tolerating oral pain meds.  UA normal.  Cont. bowel regimen.  VTE -- Lovenox, SCD's. BLE dopplers negative. Dispo -- PT/OT-CIR placement-patient may be ready as early as today    Aris Georgia, PA-C Pager: [phone] General Trauma PA Pager: [phone]   09/10/2012

## 2012-09-10 NOTE — Progress Notes (Signed)
Occupational Therapy Treatment Patient Details Name: [name] MRN: [id number] DOB: December 28, 1960 Today's Date: 09/10/2012 Time: 1191-4782 OT Time Calculation (min): 40 min  OT Assessment / Plan / Recommendation Comments on Treatment Session Pt noted to have nystagmus with Left log roll during session. Pt has vestibular deficits at this time. Pt progressed to sitting in chair at end of session. Pt motivated to participate    Follow Up Recommendations  CIR    Barriers to Discharge       Equipment Recommendations  Rolling walker with 5" wheels;Wheelchair (measurements)    Recommendations for Other Services Rehab consult  Frequency Min 3X/week   Plan Discharge plan remains appropriate    Precautions / Restrictions Precautions Precautions: Posterior Hip Precaution Booklet Issued: Yes (comment) Precaution Comments: pt able to recall 2 out 3 hip precautions and no reclall for backs  Restrictions RLE Weight Bearing: Touchdown weight bearing Other Position/Activity Restrictions: must log roll due to back precautions   Pertinent Vitals/Pain Pain at hip Nausea Reports increased comfort sitting in chair    ADL  Grooming: Wash/dry face;Set up Where Assessed - Grooming: Unsupported sitting Toilet Transfer: +2 Total assistance Toilet Transfer: Patient Percentage: 70% Toilet Transfer Method: Stand pivot Toilet Transfer Equipment: Raised toilet seat with arms (or 3-in-1 over toilet) Equipment Used: Gait belt;Rolling walker Transfers/Ambulation Related to ADLs: pt completed EOB to chair stand pivot to chair. Pt demonstrates small hop during transfer. ADL Comments: pt educated on back precautions with adls and bed mobility. pt provided pillow between knees to log roll to the Left side now that chest tube is removed. Pt tolerated well. Pt demonstrates rotating downward nystagmus with log roll to left. pt experiences nausea with log rolling. Pt and wife educated on nystagums and  correlation with falls with vestibular deficits. pt currently is not safe to complete vestibular exercises for treatment. Pt position in chair with ice for edema management. Pt with large bruise on Lt hip area noted.    OT Diagnosis:    OT Problem List:   OT Treatment Interventions:     OT Goals Acute Rehab OT Goals OT Goal Formulation: With patient Time For Goal Achievement: 09/23/12 Potential to Achieve Goals: Good ADL Goals Pt Will Perform Grooming: Unsupported;Sitting at sink;Standing at sink;with modified independence ADL Goal: Grooming - Progress: Progressing toward goals Pt Will Perform Lower Body Dressing: with supervision;with caregiver independent in assisting;Sit to stand from bed;Unsupported;with adaptive equipment;with cueing (comment type and amount) Pt Will Transfer to Toilet: with supervision;with caregiver independent in assisting;Ambulation;with DME;3-in-1;Maintaining weight bearing status;Maintaining hip precautions ADL Goal: Toilet Transfer - Progress: Progressing toward goals Pt Will Perform Toileting - Clothing Manipulation: with supervision;Standing;Sitting on 3-in-1 or toilet;with cueing (comment type and amount);with adaptive equipment ADL Goal: Toileting - Clothing Manipulation - Progress: Progressing toward goals Pt Will Perform Toileting - Hygiene: with supervision;with caregiver independent in assisting;Standing at 3-in-1/toilet;Sit to stand from 3-in-1/toilet;with adaptive equipment;with cueing (comment type and amount) Additional ADL Goal #1: Pt will state 3/3 hip precautions independently ADL Goal: Additional Goal #1 - Progress: Progressing toward goals  Visit Information  Last OT Received On: 09/10/12 Assistance Needed: +2 PT/OT Co-Evaluation/Treatment: Yes    Subjective Data      Prior Functioning       Cognition  Overall Cognitive Status: Appears within functional limits for tasks assessed/performed Arousal/Alertness: Awake/alert Orientation  Level: Appears intact for tasks assessed Behavior During Session: [facility] for tasks performed    Mobility  Shoulder Instructions Bed Mobility Supine to Sit: 1: +  2 Total assist;HOB elevated;With rails Supine to Sit: Patient Percentage: 50% Sitting - Scoot to Edge of Bed: 1: +2 Total assist Sitting - Scoot to Edge of Bed: Patient Percentage: 70% Details for Bed Mobility Assistance: pt educated on sequence with excellent return demo. Pt limited with log roll due to vestibular deficits initially and progressed with prolonged side lying Transfers Transfers: Sit to Stand;Stand to Sit Sit to Stand: 1: +2 Total assist;With upper extremity assist;From bed;From elevated surface Sit to Stand: Patient Percentage: 70% Stand to Sit: 1: +2 Total assist;With upper extremity assist;To chair/3-in-1 Stand to Sit: Patient Percentage: 70% Details for Transfer Assistance: Pt maintaining TDWB on Rt LE during transfer and feeling nauseated during mobility.       Exercises      Balance     End of Session OT - End of Session Activity Tolerance: Patient tolerated treatment well Patient left: in chair;with call bell/phone within reach;with family/visitor present Nurse Communication: Mobility status;Precautions  GO     Lucile Shutters 09/10/2012, 1:25 PM Pager: [phone]

## 2012-09-10 NOTE — Progress Notes (Signed)
POD 2 Doing well today post removal chest tube XRT yesterday No hip complaints  Objective: Current Vitals Blood pressure 120/65, pulse 65, temperature 97.9 F (36.6 C), temperature source Oral, resp. rate 18, height 5\' 9"  (1.753 m), weight 164 lb 11.2 oz (74.707 kg), SpO2 99.00%. Vital signs in last 24 hours: Temp:  [97.9 F (36.6 C)-100.7 F (38.2 C)] 97.9 F (36.6 C) (12/05 0559) Pulse Rate:  [64-73] 65  (12/05 0559) Resp:  [14-18] 18  (12/05 0559) BP: (120-152)/(55-74) 120/65 mmHg (12/05 0559) SpO2:  [97 %-100 %] 99 % (12/05 0559)  Intake/Output from previous day: 12/04 0701 - 12/05 0700 In: 720 [P.O.:720] Out: 2000 [Urine:2000]  LABS  Basename 09/10/12 0625 09/09/12 0530 09/08/12 1138 09/08/12 1043 09/08/12 0555  HGB 9.2* 8.2* 7.8* 6.5* 9.1*    Basename 09/10/12 0625 09/09/12 0530  WBC 7.9 6.7  RBC 2.95* 2.64*  HCT 26.7* 23.8*  PLT 185 142*    Basename 09/09/12 0530 09/08/12 1138 09/08/12 0555  NA 135 136 --  K 4.2 4.4 --  CL 102 -- 104  CO2 28 -- 29  BUN 15 -- 10  CREATININE 0.80 -- 0.83  GLUCOSE 114* 119* --  CALCIUM 7.8* -- 8.1*    Basename 09/10/12 0625 09/07/12 1520  LABPT -- --  INR 1.11 1.09     Physical Exam drsg clean and dry currently No change in RLE exam  Assessment/Plan: PT/OT Coumadin TDWB w post hip prec  09/10/2012, 9:27 AM

## 2012-09-10 NOTE — Progress Notes (Signed)
Physical Therapy Treatment Patient Details Name: [name] MRN: [id number] DOB: 01-25-1961 Today's Date: 09/10/2012 Time: 4098-1191 PT Time Calculation (min): 41 min  PT Assessment / Plan / Recommendation Comments on Treatment Session  Patient progressing well this session. Did notice some beating/rotating nystamus which could be causeing his nausea. Will discuss with vestibular PT for follow up. Patient motivating and hoping for CIR in the next day or two.     Follow Up Recommendations  CIR     Does the patient have the potential to tolerate intense rehabilitation     Barriers to Discharge        Equipment Recommendations  Rolling walker with 5" wheels;Wheelchair (measurements)    Recommendations for Other Services Rehab consult  Frequency Min 5X/week   Plan      Precautions / Restrictions Precautions Precautions: Posterior Hip Precaution Booklet Issued: Yes (comment) Precaution Comments: pt able to recall 2 out 3 hip precautions and no reclall for backs  Restrictions RLE Weight Bearing: Touchdown weight bearing Other Position/Activity Restrictions: must log roll due to back precautions   Pertinent Vitals/Pain     Mobility  Bed Mobility Supine to Sit: 1: +2 Total assist;HOB elevated;With rails Supine to Sit: Patient Percentage: 50% Sitting - Scoot to Edge of Bed: 1: +2 Total assist Sitting - Scoot to Edge of Bed: Patient Percentage: 70% Details for Bed Mobility Assistance: pt educated on sequence with excellent return demo. Pt limited with log roll due to vestibular deficits initially and progressed with prolonged side lying Transfers Sit to Stand: 1: +2 Total assist;With upper extremity assist;From bed;From elevated surface Sit to Stand: Patient Percentage: 70% Stand to Sit: 1: +2 Total assist;With upper extremity assist;To chair/3-in-1 Stand to Sit: Patient Percentage: 70% Details for Transfer Assistance: Pt maintaining TDWB on Rt LE during transfer and  feeling nauseated during mobility.Patient was able to take 3 small pivotal hops towared recliner Ambulation/Gait Ambulation/Gait Assistance: Not tested (comment) Stairs: No    Exercises General Exercises - Lower Extremity Quad Sets: AROM;Right;10 reps;Supine;Limitations   PT Diagnosis:    PT Problem List:   PT Treatment Interventions:     PT Goals Acute Rehab PT Goals PT Goal: Supine/Side to Sit - Progress: Progressing toward goal PT Goal: Sit to Stand - Progress: Progressing toward goal PT Goal: Stand to Sit - Progress: Progressing toward goal PT Transfer Goal: Bed to Chair/Chair to Bed - Progress: Progressing toward goal  Visit Information  Last PT Received On: 09/10/12 Assistance Needed: +2 PT/OT Co-Evaluation/Treatment: Yes    Subjective Data      Cognition  Overall Cognitive Status: Appears within functional limits for tasks assessed/performed Arousal/Alertness: Awake/alert Orientation Level: Appears intact for tasks assessed Behavior During Session: [facility] for tasks performed    Balance     End of Session PT - End of Session Equipment Utilized During Treatment: Gait belt;Oxygen;Other (comment) Activity Tolerance: Patient tolerated treatment well Patient left: in chair;with call bell/phone within reach;with family/visitor present Nurse Communication: Mobility status   GP     Fredrich Birks 09/10/2012, 2:05 PM 09/10/2012 Fredrich Birks PTA [phone] pager [phone] office

## 2012-09-10 NOTE — Progress Notes (Signed)
I met with patient and his wife at bedside. They prefer admission to inpt rehab. I will pursue [employer] [place] Approval . Rehab venue depending on their approval. [phone]

## 2012-09-10 NOTE — Addendum Note (Signed)
Encounter addended by: Delynn Flavin, RN on: 09/10/2012 10:16 AM<BR>     Documentation filed: Notes Section, Charges VN

## 2012-09-10 NOTE — Progress Notes (Signed)
[name] seen in radiation Oncology as a consult within the department. by Dr. Lonie Peak on 09/09/12.  [facility] [place].

## 2012-09-11 ENCOUNTER — Inpatient Hospital Stay (HOSPITAL_COMMUNITY)
Admission: RE | Admit: 2012-09-11 | Discharge: 2012-09-16 | DRG: 462 | Disposition: A | Discharge status: Home / Self Care | Payer: BC Managed Care – PPO | Source: Ambulatory Visit | Attending: Physical Medicine & Rehabilitation | Admitting: Physical Medicine & Rehabilitation

## 2012-09-11 ENCOUNTER — Inpatient Hospital Stay (HOSPITAL_COMMUNITY): Payer: BC Managed Care – PPO

## 2012-09-11 DIAGNOSIS — Z7982 Long term (current) use of aspirin: Secondary | ICD-10-CM

## 2012-09-11 DIAGNOSIS — Z9889 Other specified postprocedural states: Secondary | ICD-10-CM

## 2012-09-11 DIAGNOSIS — W138XXA Fall from, out of or through other building or structure, initial encounter: Secondary | ICD-10-CM

## 2012-09-11 DIAGNOSIS — K5900 Constipation, unspecified: Secondary | ICD-10-CM

## 2012-09-11 DIAGNOSIS — S73006A Unspecified dislocation of unspecified hip, initial encounter: Secondary | ICD-10-CM

## 2012-09-11 DIAGNOSIS — Z951 Presence of aortocoronary bypass graft: Secondary | ICD-10-CM

## 2012-09-11 DIAGNOSIS — S270XXA Traumatic pneumothorax, initial encounter: Secondary | ICD-10-CM

## 2012-09-11 DIAGNOSIS — Z5189 Encounter for other specified aftercare: Principal | ICD-10-CM

## 2012-09-11 DIAGNOSIS — Z79899 Other long term (current) drug therapy: Secondary | ICD-10-CM

## 2012-09-11 DIAGNOSIS — T1490XA Injury, unspecified, initial encounter: Secondary | ICD-10-CM

## 2012-09-11 DIAGNOSIS — Y93H9 Activity, other involving exterior property and land maintenance, building and construction: Secondary | ICD-10-CM

## 2012-09-11 DIAGNOSIS — S32409A Unspecified fracture of unspecified acetabulum, initial encounter for closed fracture: Secondary | ICD-10-CM

## 2012-09-11 DIAGNOSIS — E785 Hyperlipidemia, unspecified: Secondary | ICD-10-CM

## 2012-09-11 DIAGNOSIS — I10 Essential (primary) hypertension: Secondary | ICD-10-CM

## 2012-09-11 DIAGNOSIS — S301XXA Contusion of abdominal wall, initial encounter: Secondary | ICD-10-CM

## 2012-09-11 DIAGNOSIS — I2510 Atherosclerotic heart disease of native coronary artery without angina pectoris: Secondary | ICD-10-CM

## 2012-09-11 DIAGNOSIS — S32009A Unspecified fracture of unspecified lumbar vertebra, initial encounter for closed fracture: Secondary | ICD-10-CM

## 2012-09-11 LAB — PROTIME-INR
INR: 1.35 (ref 0.00–1.49)
Prothrombin Time: 16.4 seconds — ABNORMAL HIGH (ref 11.6–15.2)

## 2012-09-11 MED ORDER — WARFARIN VIDEO
Freq: Once | Status: AC
  Administered 2012-09-11: 16:00:00

## 2012-09-11 MED ORDER — ONDANSETRON HCL 4 MG PO TABS: 4.0000 mg | ORAL_TABLET | Freq: Four times a day (QID) | ORAL | Status: DC | PRN

## 2012-09-11 MED ORDER — HYDROCODONE-ACETAMINOPHEN 10-325 MG PO TABS
1.0000 | ORAL_TABLET | ORAL | Status: DC | PRN
  Administered 2012-09-11 (×2): 2 via ORAL
  Administered 2012-09-12: 1 via ORAL
  Administered 2012-09-12: 2 via ORAL
  Administered 2012-09-12 – 2012-09-13 (×3): 1 via ORAL
  Administered 2012-09-13 – 2012-09-14 (×3): 2 via ORAL
  Administered 2012-09-15: 1 via ORAL
  Administered 2012-09-15: 2 via ORAL
  Administered 2012-09-16: 1 via ORAL
  Filled 2012-09-11: qty 1
  Filled 2012-09-11 (×4): qty 2
  Filled 2012-09-11 (×2): qty 1
  Filled 2012-09-11 (×2): qty 2
  Filled 2012-09-11: qty 1
  Filled 2012-09-11: qty 2
  Filled 2012-09-11: qty 1
  Filled 2012-09-11 (×2): qty 2

## 2012-09-11 MED ORDER — ATORVASTATIN CALCIUM 20 MG PO TABS
20.0000 mg | ORAL_TABLET | Freq: Every day | ORAL | Status: DC
  Administered 2012-09-11 – 2012-09-15 (×5): 20 mg via ORAL
  Filled 2012-09-11 (×6): qty 1

## 2012-09-11 MED ORDER — WARFARIN SODIUM 7.5 MG PO TABS
7.5000 mg | ORAL_TABLET | Freq: Once | ORAL | Status: AC
  Administered 2012-09-11: 7.5 mg via ORAL
  Filled 2012-09-11: qty 1

## 2012-09-11 MED ORDER — ACETAMINOPHEN 325 MG PO TABS
325.0000 mg | ORAL_TABLET | ORAL | Status: DC | PRN
  Administered 2012-09-15 – 2012-09-16 (×4): 650 mg via ORAL
  Filled 2012-09-11 (×4): qty 2

## 2012-09-11 MED ORDER — BISACODYL 10 MG RE SUPP: 10.0000 mg | Freq: Every day | RECTAL | Status: DC | PRN

## 2012-09-11 MED ORDER — SORBITOL 70 % SOLN
60.0000 mL | Status: AC
  Administered 2012-09-12: 60 mL via ORAL
  Filled 2012-09-11 (×2): qty 60

## 2012-09-11 MED ORDER — SENNA 8.6 MG PO TABS
1.0000 | ORAL_TABLET | Freq: Two times a day (BID) | ORAL | Status: DC
  Administered 2012-09-11 – 2012-09-14 (×7): 8.6 mg via ORAL
  Filled 2012-09-11 (×9): qty 1

## 2012-09-11 MED ORDER — ENOXAPARIN SODIUM 40 MG/0.4ML ~~LOC~~ SOLN
40.0000 mg | SUBCUTANEOUS | Status: DC
  Administered 2012-09-12 – 2012-09-15 (×4): 40 mg via SUBCUTANEOUS
  Filled 2012-09-11 (×6): qty 0.4

## 2012-09-11 MED ORDER — METHOCARBAMOL 500 MG PO TABS
500.0000 mg | ORAL_TABLET | Freq: Four times a day (QID) | ORAL | Status: DC | PRN
  Administered 2012-09-11 – 2012-09-15 (×3): 500 mg via ORAL
  Filled 2012-09-11 (×3): qty 1

## 2012-09-11 MED ORDER — SORBITOL 70 % SOLN
30.0000 mL | Freq: Every day | Status: DC | PRN
  Administered 2012-09-11: 30 mL via ORAL
  Filled 2012-09-11: qty 30

## 2012-09-11 MED ORDER — ONDANSETRON HCL 4 MG/2ML IJ SOLN: 4.0000 mg | Freq: Four times a day (QID) | INTRAMUSCULAR | Status: DC | PRN

## 2012-09-11 MED ORDER — WARFARIN - PHARMACIST DOSING INPATIENT: Freq: Every day | Status: DC

## 2012-09-11 MED ORDER — WARFARIN SODIUM 7.5 MG PO TABS: 7.5000 mg | ORAL_TABLET | Freq: Once | ORAL | Status: DC

## 2012-09-11 MED ORDER — MUPIROCIN 2 % EX OINT
TOPICAL_OINTMENT | Freq: Two times a day (BID) | CUTANEOUS | Status: DC
  Administered 2012-09-11 – 2012-09-16 (×10): via NASAL
  Filled 2012-09-11: qty 22

## 2012-09-11 MED ORDER — WARFARIN SODIUM 7.5 MG PO TABS
7.5000 mg | ORAL_TABLET | Freq: Once | ORAL | Status: DC
  Filled 2012-09-11: qty 1

## 2012-09-11 MED ORDER — POLYETHYLENE GLYCOL 3350 17 G PO PACK
17.0000 g | PACK | Freq: Every day | ORAL | Status: DC
  Administered 2012-09-12 – 2012-09-16 (×5): 17 g via ORAL
  Filled 2012-09-11 (×6): qty 1

## 2012-09-11 NOTE — H&P (Signed)
Physical Medicine and Rehabilitation Admission H&P  Chief Complaint   Patient presents with   .  Fall   :  HPI: [name] is a 51 y.o. right-handed male admitted 09/05/2012 after a fall from roof while cleaning leaves. There was no report of loss of consciousness. Patient noted immediate back pain and right hip pain. Cranial CT scan was negative. X-rays and imaging revealed a left pneumothorax, pneumomediastinum, right hip dislocation with posterior acetabular fracture as well as lumbar 2-4 left transverse process fracture and left flank hematoma. A chest tube was inserted. Patient underwent ORIF for right acetabular fracture 09/08/2012 per Dr. [name]. Advised touchdown weightbearing to right lower extremity and posterior hip cautions. Placed on Coumadin for DVT prophylaxis. Venous Doppler studies lower extremities 09/08/2012 negative. Chest tube removed 09/10/2012 . Patient received radiation for HO prophylaxis 09/09/2012. Physical and occupational therapy evaluations completed an ongoing with recommendations of physical medicine rehabilitation consult to consider inpatient rehabilitation services. Patient was felt to be a good candidate for inpatient rehabilitation services and was admitted for a comprehensive rehabilitation program  Review of Systems  Musculoskeletal: Positive for myalgias.  All other systems reviewed and are negative  Past Medical History   Diagnosis  Date   .  Left inguinal hernia    .  Right inguinal hernia    .  Hypertension    .  Hyperlipidemia    .  Asthma    .  Allergy    .  Postsurgical aortocoronary bypass status    .  CAD (coronary artery disease)     Past Surgical History   Procedure  Date   .  Double coronary artery bypass    .  Hernia repair      left and right inguinal   .  Orif acetabular fracture  09/08/2012     Procedure: OPEN REDUCTION INTERNAL FIXATION (ORIF) ACETABULAR FRACTURE; Surgeon: Budd Palmer, MD; Location: [facility] OR; Service: Orthopedics;  Laterality: Right;    Family History   Problem  Relation  Age of Onset   .  Hypertension  Mother    .  Hyperlipidemia  Mother    .  Hypertension  Father    .  Hyperlipidemia  Father     Social History: reports that he has never smoked. He does not have any smokeless tobacco history on file. He reports that he does not drink alcohol or use illicit drugs.  Allergies:  Allergies   Allergen  Reactions   .  Cefaclor      REACTION: rash    Medications Prior to Admission   Medication  Sig  Dispense  Refill   .  acetaminophen (TYLENOL) 500 MG tablet  Take 500 mg by mouth every 6 (six) hours as needed. For pain/fever     .  aspirin 325 MG tablet  Take 325 mg by mouth daily.     Marland Kitchen  atorvastatin (LIPITOR) 20 MG tablet  Take 1 tablet (20 mg total) by mouth daily.  90 tablet  3   .  cetirizine (ZYRTEC) 10 MG tablet  Take 10 mg by mouth daily.     .  Melatonin 3 MG TABS  Take 1 tablet by mouth at bedtime as needed. For sleep      Home:  Home Living  Lives With: Spouse  Available Help at Discharge: Available 24 hours/day  Type of Home: House  Home Access: Stairs to enter  [employer] of Steps: 3  Entrance Stairs-Rails: None  Home Layout: Two level;Able to live on main level with bedroom/bathroom  Alternate Level Stairs-Number of Steps: 14  Bathroom Shower/Tub: Walk-in shower;Door  [employer] Toilet: Standard  Bathroom Accessibility: Yes  How Accessible: Accessible via walker  Home Adaptive Equipment: None  Functional History:  Prior Function  Able to Take Stairs?: Yes  Driving: Yes  Vocation: Full time employment  Comments: Acupuncturist, 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, PTA was traveling by plane fairly often  Functional Status:  Mobility:  Bed Mobility  Bed Mobility: Supine to Sit;Sit to Supine;Sitting - Scoot to Edge of Bed  Supine to Sit: 1: +2 Total assist;HOB elevated;With rails  Supine to Sit: Patient Percentage: 50%  Sitting - Scoot to Edge of Bed: 1: +2 Total  assist  Sitting - Scoot to Edge of Bed: Patient Percentage: 70%  Sit to Supine: 1: +2 Total assist  Sit to Supine: Patient Percentage: 30%  Transfers  Transfers: Sit to Stand;Stand to Sit  Sit to Stand: 1: +2 Total assist;With upper extremity assist;From bed;From elevated surface  Sit to Stand: Patient Percentage: 70%  Stand to Sit: 1: +2 Total assist;With upper extremity assist;To chair/3-in-1  Stand to Sit: Patient Percentage: 70%  Ambulation/Gait  Ambulation/Gait Assistance: Not tested (comment)  Stairs: No  Wheelchair Mobility  Wheelchair Mobility: No  ADL:  ADL  Eating/Feeding: Independent  Grooming: Therapist, nutritional;Set up  Where Assessed - Grooming: Unsupported sitting  Upper Body Bathing: Minimal assistance  Where Assessed - Upper Body Bathing: Supported sitting  Lower Body Bathing: +1 Total assistance  Where Assessed - Lower Body Bathing: Supported sit to stand  Upper Body Dressing: Minimal assistance  Where Assessed - Upper Body Dressing: Supported sitting  Lower Body Dressing: +1 Total assistance  Where Assessed - Lower Body Dressing: Supported sit to Scientist, research (life sciences): +2 Total assistance  Toilet Transfer Method: Archivist: Raised toilet seat with arms (or 3-in-1 over toilet)  Equipment Used: Gait belt;Rolling walker  Transfers/Ambulation Related to ADLs: pt completed EOB to chair stand pivot to chair. Pt demonstrates small hop during transfer.  ADL Comments: pt educated on back precautions with adls and bed mobility. pt provided pillow between knees to log roll to the Left side now that chest tube is removed. Pt tolerated well. Pt demonstrates rotating downward nystagmus with log roll to left. pt experiences nausea with log rolling. Pt and wife educated on nystagums and correlation with falls with vestibular deficits. pt currently is not safe to complete vestibular exercises for treatment. Pt position in chair with ice for edema management.  Pt with large bruise on Lt hip area noted.  Cognition:  Cognition  Arousal/Alertness: Awake/alert  Orientation Level: Oriented X4  Cognition  Overall Cognitive Status: Appears within functional limits for tasks assessed/performed  Arousal/Alertness: Awake/alert  Orientation Level: Appears intact for tasks assessed  Behavior During Session: [facility] for tasks performed  Blood pressure 120/52, pulse 60, temperature 97.4 F (36.3 C), temperature source Oral, resp. rate 18, height 5\' 9"  (1.753 m), weight 74.707 kg (164 lb 11.2 oz), SpO2 98.00%.  Physical Exam  Vitals reviewed.  Constitutional: He is oriented to person, place, and time. He appears well-developed. NAD HENT: oral mucosa pink and moist.  Head: Normocephalic.  Eyes:  Pupils round and reactive to light  Neck: Neck supple. No thyromegaly present.  Cardiovascular: Normal rate and regular rhythm.  Pulmonary/Chest: Breath sounds normal. No respiratory distress. Mild crackles left base Abdominal: Soft. Bowel sounds are hypoactive. He exhibits  no distension.  Musculoskeletal:  Right Leg appropriately tender.  Neurological: He is alert and oriented to person, place, and time. No cranial nerve deficit.  UE 5/5. LLE is 3-4 prox to 4/5 distally RLE is 1-2 prox to 3-4 distally. No sensory deficits.  Skin:  Chest tube site healing with minimal drainage and pink granulation tissue. Right hip incision is clean and intact with staples and minimal drainage. Mild surrounding edema. Psychiatric: He has a normal mood and affect. His behavior is normal. Judgment and thought content normal  Results for orders placed during the hospital encounter of 09/05/12 (from the past 48 hour(s))   CBC Status: Abnormal    Collection Time    09/10/12 6:25 AM   Component  Value  Range  Comment    WBC  7.9  4.0 - 10.5 K/uL     RBC  2.95 (*)  4.22 - 5.81 MIL/uL     Hemoglobin  9.2 (*)  13.0 - 17.0 g/dL     HCT  40.9 (*)  81.1 - 52.0 %     MCV  90.5  78.0 - 100.0  fL     MCH  31.2  26.0 - 34.0 pg     MCHC  34.5  30.0 - 36.0 g/dL     RDW  91.4  78.2 - 95.6 %     Platelets  185  150 - 400 K/uL    PROTIME-INR Status: Normal    Collection Time    09/10/12 6:25 AM   Component  Value  Range  Comment    Prothrombin Time  14.2  11.6 - 15.2 seconds     INR  1.11  0.00 - 1.49    PROTIME-INR Status: Abnormal    Collection Time    09/11/12 6:13 AM   Component  Value  Range  Comment    Prothrombin Time  16.4 (*)  11.6 - 15.2 seconds     INR  1.35  0.00 - 1.49     Dg Chest Port 1 View  09/10/2012 *RADIOLOGY REPORT* Clinical Data: Trauma. Chest tube. Fall. PORTABLE CHEST - 1 VIEW Comparison: 1 day prior Findings: Prior median sternotomy. Left-sided chest tube is unchanged in position. Decrease in tiny left apical pneumothorax, less than 5%. Normal heart size. No pleural fluid. Clear lungs. IMPRESSION: Left-sided chest tube remains in place, with decreased tiny left apical pneumothorax. Original Report Authenticated By: Jeronimo Greaves, M.D.   Post Admission Physician Evaluation:  1. Functional deficits secondary to Right hip dislocation, acetabular fx after fall. 2. Patient is admitted to receive collaborative, interdisciplinary care between the physiatrist, rehab nursing staff, and therapy team. 3. Patient's level of medical complexity and substantial therapy needs in context of that medical necessity cannot be provided at a lesser intensity of care such as a SNF. 4. Patient has experienced substantial functional loss from his/her baseline which was documented above under the "Functional History" and "Functional Status" headings. Judging by the patient's diagnosis, physical exam, and functional history, the patient has potential for functional progress which will result in measurable gains while on inpatient rehab. These gains will be of substantial and practical use upon discharge in facilitating mobility and self-care at the household level. 5. Physiatrist will provide  24 hour management of medical needs as well as oversight of the therapy plan/treatment and provide guidance as appropriate regarding the interaction of the two. 6. 24 hour rehab nursing will assist with bladder management, bowel management, safety, skin/wound care, disease management,  medication administration, pain management and patient education and help integrate therapy concepts, techniques,education, etc. 7. PT will assess and treat for: .Lower extremity strength, range of motion, stamina, balance, functional mobility, safety, adaptive techniques and equipment, pain mgt, wb. Goals are: supervision to minimal assist. 8. OT will assess and treat for: ADL's, functional mobility, safety, upper extremity strength, adaptive techniques and equipment, pain mgt, . Goals are: supervision to minimal assist. 9. SLP will assess and treat for: n/a. Goals are: n/a. 10. Case Management and Social Worker will assess and treat for psychological issues and discharge planning. 11. Team conference will be held weekly to assess progress toward goals and to determine barriers to discharge. 12. Patient will receive at least 3 hours of therapy per day at least 5 days per week. 13. ELOS: 7 days Prognosis: excellent Medical Problem List and Plan:  1. Right hip dislocation with acetabular fracture after fall status post ORIF 09/08/2012  2. DVT Prophylaxis/Anticoagulation: Coumadin for DVT prophylaxis. Monitor for any bleeding episodes. Venous Doppler studies 09/08/2012 negative  3. Pain Management: Hydrocodone and Robaxin as needed. Monitor with increased mobility  4. Neuropsych: This patient is capable of making decisions on his/her own behalf.  5. Lumbar 2-4 transverse process fracture with left flank hematoma. No back brace indicated.  6. Left pneumothorax. Chest tube removed 09/10/2012. No shortness of breath noted. Check oxygen saturations every shift  7. Hyperlipidemia. Lipitor 8. Constipation: augment bowel  regimen   Ivory Broad, MD 09/11/2012

## 2012-09-11 NOTE — Progress Notes (Signed)
Insurance has approved inpt rehab admission and bed is available today.[phone]

## 2012-09-11 NOTE — Progress Notes (Signed)
Patient transferred to 4000 (Inpatient rehab). Report called to RN on 4000. Questions answered, and patient brought to room 4011.

## 2012-09-11 NOTE — Progress Notes (Signed)
ANTICOAGULATION CONSULT NOTE - Follow up Consult  Pharmacy Consult for Coumadin Indication: VTE prophylaxis  Allergies  Allergen Reactions  . Cefaclor     REACTION: rash    Patient Measurements: Height: 5\' 9"  (175.3 cm) Weight: 164 lb 11.2 oz (74.707 kg) IBW/kg (Calculated) : 70.7   Vital Signs: Temp: 97.4 F (36.3 C) (12/06 0645) Temp src: Oral (12/05 2157) BP: 120/52 mmHg (12/06 0645) Pulse Rate: 60  (12/06 0645)  Labs:  Alvira Philips 09/11/12 4540 09/10/12 0625 09/09/12 0530 09/08/12 1138  HGB -- 9.2* 8.2* --  HCT -- 26.7* 23.8* 23.0*  PLT -- 185 142* --  APTT -- -- -- --  LABPROT 16.4* 14.2 -- --  INR 1.35 1.11 -- --  HEPARINUNFRC -- -- -- --  CREATININE -- -- 0.80 --  CKTOTAL -- -- -- --  CKMB -- -- -- --  TROPONINI -- -- -- --    Estimated Creatinine Clearance: 106.8 ml/min (by C-G formula based on Cr of 0.8).   Medical History: Past Medical History  Diagnosis Date  . Left inguinal hernia   . Right inguinal hernia   . Hypertension   . Hyperlipidemia   . Asthma   . Allergy   . Postsurgical aortocoronary bypass status   . CAD (coronary artery disease)     Medications:  Prescriptions prior to admission  Medication Sig Dispense Refill  . acetaminophen (TYLENOL) 500 MG tablet Take 500 mg by mouth every 6 (six) hours as needed. For pain/fever      . aspirin 325 MG tablet Take 325 mg by mouth daily.        Marland Kitchen atorvastatin (LIPITOR) 20 MG tablet Take 1 tablet (20 mg total) by mouth daily.  90 tablet  3  . cetirizine (ZYRTEC) 10 MG tablet Take 10 mg by mouth daily.      . Melatonin 3 MG TABS Take 1 tablet by mouth at bedtime as needed. For sleep        Assessment: 51 y/o male patient admitted with acetabular fracture, now s/p ORIF requiring coumadin for dvt prophylaxis. No drug interactions identified. No bleeding reported.  INR subtherapeutic but increasing. Patient also on lovenox.   Goal of Therapy:  INR 2-3 Monitor platelets by anticoagulation  protocol: Yes   Plan:  Coumadin 7.5mg  po x 1 dose tonight. Daily PT/INR.  Wendie Simmer, PharmD, BCPS Clinical Pharmacist  Pager: [phone]

## 2012-09-11 NOTE — Plan of Care (Signed)
Overall Plan of Care [facility]) Patient Details Name: [name] MRN: [id number] DOB: August 30, 1961  Diagnosis:  Right acetabular fx  Primary Diagnosis:    <principal problem not specified> Co-morbidities: htn, asthma  Functional Problem List  Patient demonstrates impairments in the following areas: Bowel, Medication Management, Pain, Safety and Skin Integrity  Basic ADL's: grooming, bathing, dressing and toileting Advanced ADL's: N/A  Transfers:  bed mobility, bed to chair, car and furniture Locomotion:  ambulation and stairs  Additional Impairments:  None  Anticipated Outcomes Item Anticipated Outcome  Eating/Swallowing    Basic self-care  Min Assist  Tolieting  Modified independent  Bowel/Bladder  Mod I bowel and bladder  Transfers  Supervision bed to chair and car transfers  Locomotion  Supervision gait controlled environment and home environment, no WC goals, Stairs min assist.  Communication    Cognition    Pain  Pain managed with medication at or below level of 2  Safety/Judgment    Other     Therapy Plan: PT Frequency: 2-3 X/day, 60-90 minutes OT Frequency: 1-2 X/day, 60-90 minutes     Team Interventions: Item RN PT OT SLP SW TR Other  Self Care/Advanced ADL Retraining  x x      Neuromuscular Re-Education  x x      xTherapeutic Activities  x x      UE/LE Strength Training/ROM  x x      UE/LE Coordination Activities  x x      Visual/Perceptual Remediation/Compensation  x       DME/Adaptive Equipment Instruction  x x      Therapeutic Exercise  x x      Balance/Vestibular Training  x x      Patient/Family Education x x x      Cognitive Remediation/Compensation         Functional Mobility Training  x x      Ambulation/Gait Training  x       Museum/gallery curator  x       Wheelchair Propulsion/Positioning  x       Functional Tourist information centre manager Reintegration  x       Dysphagia/Aspiration Sales executive         Bladder Management         Bowel Management x        Disease Management/Prevention         Pain Management x  x      Medication Management x        Skin Care/Wound Management x        Splinting/Orthotics         Discharge Planning  x x      Psychosocial Support  x                          Team Discharge Planning: Destination:  Home Projected Follow-up:  PT and Home Health Projected Equipment Needs:  Walker and possibly WC for community access, but rental if so, TBD Patient/family involved in discharge planning:  Yes  MD ELOS: 7-10 days Medical Rehab Prognosis:  Excellent Assessment: Pt admitted for CIR therapies. The team will be addressing Lower extremity strength, range of motion, stamina, balance, functional mobility, safety, adaptive techniques and equipment, ADL's, weight bearing, pain. Goals range from mod I to moderate assist.

## 2012-09-11 NOTE — Progress Notes (Signed)
[facility]  S:  Doing well  No complaints  Pain 1-2/10  Looking forward to starting therapies   Seems really motivated  O:  Gen: NAD, awake, alert  Right LEx:   Dressing pristine   Distal motor and sensory functions intact   Ext warm   Swelling stable   + DP pulse   No DCT  A:   Fall with R acetabulum Fx/dislocation  P:   1. R posterior wall acetabulum fx/dislocation s/p ORIF and XRT  TDWB x 8 weeks  Posterior hip precautions x 3 months  Dressing changes as needed  Ice prn  TED hose    Caution when pt sitting in chair that Right leg does not adduct or internally rotate, place pillow between knees if necessary   2. DVT/PE prophylaxis  rec coumadin x 8 weeks total therapy 3. Continue per Rehab team  Mearl Latin, PA-C [facility] [phone] (P) 09/11/2012 4:52 PM

## 2012-09-11 NOTE — Progress Notes (Signed)
Physical Therapy Treatment Patient Details Name: [name] MRN: [id number] DOB: 07-13-1961 Today's Date: 09/11/2012 Time: 7829-5621 PT Time Calculation (min): 24 min  PT Assessment / Plan / Recommendation Comments on Treatment Session  Patient progressing well and able to ambulate this session. Patient very motivated. Had less nausea this session. Is hoping for CIR later today pending insurance approval    Follow Up Recommendations  CIR     Does the patient have the potential to tolerate intense rehabilitation     Barriers to Discharge        Equipment Recommendations  Rolling walker with 5" wheels;Wheelchair (measurements)    Recommendations for Other Services    Frequency Min 5X/week   Plan Discharge plan remains appropriate;Frequency remains appropriate    Precautions / Restrictions Precautions Precautions: Posterior Hip Precaution Comments: Patient able to recall all precautions and recall back precautions Restrictions RLE Weight Bearing: Touchdown weight bearing   Pertinent Vitals/Pain     Mobility  Bed Mobility Supine to Sit: 1: +2 Total assist Supine to Sit: Patient Percentage: 60% Sitting - Scoot to Edge of Bed: 4: Min assist Details for Bed Mobility Assistance: Patient able to roll slightly to assist with back precautions. Patient educated on sequency with OOB and A for LEs and shouders Transfers Sit to Stand: 1: +2 Total assist;With upper extremity assist Sit to Stand: Patient Percentage: 80% Stand to Sit: 1: +2 Total assist;With upper extremity assist Stand to Sit: Patient Percentage: 80% Details for Transfer Assistance: A for stability and to ensure TWB. Patient cued for best technique and hand placement Ambulation/Gait Ambulation/Gait Assistance: 4: Min assist Ambulation Distance (Feet): 120 Feet Assistive device: Rolling walker Ambulation/Gait Assistance Details: Cues for safe mangament and positioning inside of RW. Cues for posture. A to ensure  stability. Chair to follow Gait Pattern: Step-to pattern    Exercises     PT Diagnosis:    PT Problem List:   PT Treatment Interventions:     PT Goals Acute Rehab PT Goals PT Goal: Supine/Side to Sit - Progress: Progressing toward goal PT Goal: Sit to Stand - Progress: Progressing toward goal PT Goal: Stand to Sit - Progress: Progressing toward goal PT Transfer Goal: Bed to Chair/Chair to Bed - Progress: Progressing toward goal PT Goal: Ambulate - Progress: Met  Visit Information  Last PT Received On: 09/11/12 Assistance Needed: +2    Subjective Data      Cognition  Overall Cognitive Status: Appears within functional limits for tasks assessed/performed Arousal/Alertness: Awake/alert Orientation Level: Appears intact for tasks assessed Behavior During Session: [facility] for tasks performed    Balance     End of Session PT - End of Session Equipment Utilized During Treatment: Gait belt Activity Tolerance: Patient tolerated treatment well Patient left: in chair;with call bell/phone within reach;with family/visitor present Nurse Communication: Mobility status   GP     Fredrich Birks 09/11/2012, 11:10 AM  09/11/2012 Fredrich Birks PTA [phone] pager [phone] office

## 2012-09-11 NOTE — PMR Pre-admission (Signed)
PMR Admission Coordinator Pre-Admission Assessment  Patient: [name] is an 51 y.o., male MRN: [id number] DOB: 1961/04/12 Height: 5\' 9"  (175.3 cm) Weight: 74.707 kg (164 lb 11.2 oz)              Insurance Information HMO:     PPO: yes     PCP:      IPA:      80/20:      OTHER:  PRIMARY: [employer] [place]      Policy#: [id number]      Subscriber: pt CM Name: [name]      Phone#: [phone]     Fax#: [phone] Pre-Cert#: [id number]     Approved until 09/23/12. Fax clinicals to general fax . No specific CM assigned. Employer: [employer] Benefits:  Phone #: [phone]     Name: 12/5 [facility]. Date: 10/07/06 active     Deduct: $2500 met      Out of Pocket Max: $4000 not met      Life Max: none CIR: 90%      SNF: 90% 100 days per benefit Outpatient: 90%      Co-Pay: 100 visits combined Home Health: 90%      Co-Pay: no visit limits DME: 90%      Co-Pay: [phone] opt 3 Providers: in network  SECONDARY: none       Medicaid Application Date:       Case Manager:  Disability Application Date:       Case Worker:   Emergency Contact Information Contact Information    Name Relation Home Work Mobile   [name] Spouse [phone]  [phone]   [name] Daughter   [phone]     Current Medical History  Patient Admitting Diagnosis: right hip dislocation with right acetabular fx after fall  History of Present Illness:[name] is a 51 y.o. right-handed male admitted 09/05/2012 after a fall from roof while cleaning leaves. There was no report of loss of consciousness. Patient noted immediate back pain and right hip pain. Cranial CT scan was negative. X-rays and imaging revealed a left pneumothorax, pneumomediastinum, right hip dislocation with posterior acetabular fracture as well as lumbar 2-4 left transverse process fracture and left flank hematoma. A chest tube was inserted. Patient underwent ORIF for right acetabular fracture 09/08/2012 per Dr.  [name]. Advised touchdown weightbearing to right lower extremity and posterior hip cautions. Placed on Coumadin for DVT prophylaxis. Chest tube removed 09/10/2012 . Scheduled to get her radiation for HO prophylaxis 09/09/2012.   Past Medical History  Past Medical History  Diagnosis Date  . Left inguinal hernia   . Right inguinal hernia   . Hypertension   . Hyperlipidemia   . Asthma   . Allergy   . Postsurgical aortocoronary bypass status   . CAD (coronary artery disease)     Family History  family history includes Hyperlipidemia in his father and mother and Hypertension in his father and mother.  Prior Rehab/Hospitalizations: none   Current Medications  Current facility-administered medications:[EXPIRED] acetaminophen (TYLENOL) tablet 1,000 mg, 1,000 mg, Oral, Q6H, Mearl Latin, PA, 1,000 mg at 09/10/12 1336;  atorvastatin (LIPITOR) tablet 20 mg, 20 mg, Oral, q1800, Liz Malady, MD, 20 mg at 09/10/12 1725;  coumadin book, , Does not apply, Once, [name], PA;  docusate sodium (COLACE) capsule 200 mg, 200 mg, Oral, BID, [name], PA, 200 mg at 09/11/12 1020 enoxaparin (LOVENOX) injection 40 mg, 40 mg, Subcutaneous, Q24H, [name],  PA, 40 mg at 09/10/12 2327;  HYDROcodone-acetaminophen (NORCO) 10-325 MG per tablet 1-2 tablet, 1-2 tablet, Oral, Q4H PRN, Liz Malady, MD, 1 tablet at 09/11/12 1020;  HYDROmorphone (DILAUDID) injection 0.5 mg, 0.5 mg, Intravenous, Q4H PRN, [name], PA, 0.5 mg at 09/10/12 4098 methocarbamol (ROBAXIN) tablet 1,000 mg, 1,000 mg, Oral, QID, Mearl Latin, PA, 1,000 mg at 09/11/12 1020;  mupirocin ointment (BACTROBAN) 2 %, , Nasal, BID, Budd Palmer, MD;  ondansetron [facility]) injection 4 mg, 4 mg, Intravenous, Q6H PRN, Liz Malady, MD, 4 mg at 09/07/12 2044;  ondansetron [facility]) tablet 4 mg, 4 mg, Oral, Q6H PRN, Liz Malady, MD, 4 mg at 09/10/12 1741 polyethylene glycol (MIRALAX / GLYCOLAX) packet 17 g, 17 g,  Oral, Daily, [name], PA, 17 g at 09/11/12 1019;  [COMPLETED] warfarin (COUMADIN) tablet 7.5 mg, 7.5 mg, Oral, ONCE-1800, Budd Palmer, MD, 7.5 mg at 09/10/12 1725;  warfarin (COUMADIN) tablet 7.5 mg, 7.5 mg, Oral, ONCE-1800, Mearl Latin, PA;  warfarin (COUMADIN) video, , Does not apply, Once, [name], PA Warfarin - Pharmacist Dosing Inpatient, , Does not apply, q1800, Mearl Latin, PA;  zolpidem (AMBIEN) tablet 5 mg, 5 mg, Oral, QHS PRN, [name], PA;  [DISCONTINUED] oxyCODONE (Oxy IR/ROXICODONE) immediate release tablet 5-15 mg, 5-15 mg, Oral, Q4H PRN, [name], PA, 10 mg at 09/10/12 1056  Patients Current Diet: General  Precautions / Restrictions Precautions Precautions: Posterior Hip Precaution Booklet Issued: Yes (comment) Precaution Comments: Patient able to recall all precautions and recall back precautions Restrictions Weight Bearing Restrictions: Yes RLE Weight Bearing: Touchdown weight bearing Other Position/Activity Restrictions: must log roll due to back precautions   Prior Activity Level Community (5-7x/wk): very active  Journalist, newspaper / Equipment Home Assistive Devices/Equipment: Eyeglasses Home Adaptive Equipment: None  Prior Functional Level Prior Function Level of Independence: Independent Able to Take Stairs?: Yes Driving: Yes Vocation: Full time employment Comments: Acupuncturist, 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, PTA was traveling by plane fairly often  Current Functional Level Cognition  Arousal/Alertness: Awake/alert Overall Cognitive Status: Appears within functional limits for tasks assessed/performed Orientation Level: Oriented X4    Extremity Assessment (includes Sensation/Coordination)  RUE ROM/Strength/Tone: WFL for tasks assessed RUE Sensation: WFL - Light Touch;WFL - Proprioception RUE Coordination: WFL - gross/fine motor  RLE ROM/Strength/Tone: Deficits;Due to pain;Due to precautions RLE  ROM/Strength/Tone Deficits: hip flex 1/5, knee ext 2/5, keeps bilateral hips in external rotation (PTA) RLE Sensation: WFL - Light Touch RLE Coordination: Deficits RLE Coordination Deficits: decreased ability to move leg    ADLs  Eating/Feeding: Independent Grooming: Wash/dry face;Set up Where Assessed - Grooming: Unsupported sitting Upper Body Bathing: Minimal assistance Where Assessed - Upper Body Bathing: Supported sitting Lower Body Bathing: +1 Total assistance Where Assessed - Lower Body Bathing: Supported sit to stand Upper Body Dressing: Minimal assistance Where Assessed - Upper Body Dressing: Supported sitting Lower Body Dressing: +1 Total assistance Where Assessed - Lower Body Dressing: Supported sit to Pharmacist, hospital: +2 Total assistance Toilet Transfer: Patient Percentage: 70% Statistician Method: Surveyor, minerals: Raised toilet seat with arms (or 3-in-1 over toilet) Toileting - Clothing Manipulation and Hygiene: +1 Total assistance Where Assessed - Glass blower/designer Manipulation and Hygiene: Standing Equipment Used: Gait belt;Rolling walker Transfers/Ambulation Related to ADLs: pt completed EOB to chair stand pivot to chair. Pt demonstrates small hop during transfer. ADL Comments: pt educated on back precautions with adls and bed  mobility. pt provided pillow between knees to log roll to the Left side now that chest tube is removed. Pt tolerated well. Pt demonstrates rotating downward nystagmus with log roll to left. pt experiences nausea with log rolling. Pt and wife educated on nystagums and correlation with falls with vestibular deficits. pt currently is not safe to complete vestibular exercises for treatment. Pt position in chair with ice for edema management. Pt with large bruise on Lt hip area noted.    Mobility  Bed Mobility: Supine to Sit;Sit to Supine;Sitting - Scoot to Edge of Bed Supine to Sit: 1: +2 Total assist Supine to Sit:  Patient Percentage: 60% Sitting - Scoot to Edge of Bed: 4: Min assist Sitting - Scoot to [place] of Bed: Patient Percentage: 70% Sit to Supine: 1: +2 Total assist Sit to Supine: Patient Percentage: 30%    Transfers  Transfers: Sit to Stand;Stand to Sit Sit to Stand: 1: +2 Total assist;With upper extremity assist Sit to Stand: Patient Percentage: 80% Stand to Sit: 1: +2 Total assist;With upper extremity assist Stand to Sit: Patient Percentage: 80%    Ambulation / Gait / Stairs / Psychologist, prison and probation services  Ambulation/Gait Ambulation/Gait Assistance: 4: Min Environmental consultant (Feet): 120 Feet Assistive device: Rolling walker Ambulation/Gait Assistance Details: Cues for safe mangament and positioning inside of RW. Cues for posture. A to ensure stability. Chair to follow Gait Pattern: Step-to pattern Stairs: No Wheelchair Mobility Wheelchair Mobility: No    Posture / Balance Static Sitting Balance Static Sitting - Balance Support: Feet supported;Bilateral upper extremity supported Static Sitting - Level of Assistance: 4: Min assist;5: Stand by assistance    Special needs/care consideration BiPAP/CPAP CPAP at home CPM  none Continuous Drip IV  none Dialysis    none    Days Life Vest none Oxygen none Special Bed none Trach Size none Wound Vac (area) none      Location Skin none  Large bruise l hip and butt per wife Bowel mgmt: no BM for 1 week. Using ducolax tabs, miralax and not worked yet Bladder mgmt: urinal without problems Diabetic mgmt none     Previous Home Environment Living Arrangements: Spouse/significant other Lives With: Spouse Available Help at Discharge: Available 24 hours/day Type of Home: House Home Layout: Two level;Able to live on main level with bedroom/bathroom Alternate Level Stairs-Number of Steps: 14 Home Access: Stairs to enter Entrance Stairs-Rails: None Entrance Stairs-Number of Steps: 3 Bathroom Shower/Tub: Psychologist, counselling;Door [employer] Toilet:  Standard Bathroom Accessibility: Yes How Accessible: Accessible via walker Home Care Services: No  Discharge Living Setting Plans for Discharge Living Setting: Patient's home;Lives with (comment) (spouse) Type of Home at Discharge: House Discharge Home Layout: Two level;Able to live on main level with bedroom/bathroom Alternate Level Stairs-Number of Steps: 14 steps Discharge Home Access: Stairs to enter Entrance Stairs-Rails: None Entrance Stairs-Number of Steps: 3 steps Discharge Bathroom Shower/Tub: Walk-in shower Discharge Bathroom Toilet: Standard Discharge Bathroom Accessibility: Yes How Accessible: Accessible via walker Do you have any problems obtaining your medications?: No  Social/Family/Support Systems Patient Roles: Spouse;Parent;Other (Comment) (employee) Contact Information: Lipa Knauff, wife Anticipated Caregiver: wife Anticipated Caregiver's Contact Information: cell [phone] Ability/Limitations of Caregiver: none Caregiver Availability: 24/7 Discharge Plan Discussed with Primary Caregiver: Yes Is Caregiver In Agreement with Plan?: Yes Does Caregiver/Family have Issues with Lodging/Transportation while Pt is in Rehab?: No (wife stays in hospital with pt)    Goals/Additional Needs Patient/Family Goal for Rehab: supervision with PT, supervision to min assist OT Expected length of stay: ELOS  1 to 2 weeks Equipment Needs: uses CPAP at home but not well Pt/Family Agrees to Admission and willing to participate: Yes Program Orientation Provided & Reviewed with Pt/Caregiver Including Roles  & Responsibilities: Yes   Decrease burden of Care through IP rehab admission: not applicable   Possible need for SNF placement upon discharge:no   Patient Condition: This patient's condition remains as documented in the Consult dated 09/10/2012, in which the Rehabilitation Physician determined and documented that the patient's condition is appropriate for intensive  rehabilitative care in an inpatient rehabilitation facility.  Preadmission Screen Completed By:  Clois Dupes, 09/11/2012 12:31 PM ______________________________________________________________________   Discussed status with Dr.  Riley Kill on 09/11/2012 at  1233 and received telephone approval for admission today.  Admission Coordinator:  Clois Dupes, time 1610 Date 09/11/2012.

## 2012-09-11 NOTE — Progress Notes (Signed)
LOS: 6 days   Subjective: Pt feeling good this am, he states his pain is much improved and he is needing less pain meds.  Pt is tolerating his diet well, urinating well, passing flatus, but not BM yet.  Pt pending insurance approval for rehab.  Objective: Vital signs in last 24 hours: Temp:  [97.4 F (36.3 C)-98.9 F (37.2 C)] 97.4 F (36.3 C) (12/06 0645) Pulse Rate:  [60-64] 60  (12/06 0645) Resp:  [16-18] 18  (12/06 0645) BP: (115-133)/(51-58) 120/52 mmHg (12/06 0645) SpO2:  [96 %-100 %] 98 % (12/06 0645) Last BM Date:  (Prior to admission)  Lab Results:  CBC  Basename 09/10/12 0625 09/09/12 0530  WBC 7.9 6.7  HGB 9.2* 8.2*  HCT 26.7* 23.8*  PLT 185 142*   BMET  Basename 09/09/12 0530 09/08/12 1138  NA 135 136  K 4.2 4.4  CL 102 --  CO2 28 --  GLUCOSE 114* 119*  BUN 15 --  CREATININE 0.80 --  CALCIUM 7.8* --    Imaging: Dg Chest Port 1 View  09/10/2012  *RADIOLOGY REPORT*  Clinical Data: Trauma.  Chest tube.  Fall.  PORTABLE CHEST - 1 VIEW  Comparison: 1 day prior  Findings: Prior median sternotomy.  Left-sided chest tube is unchanged in position.  Decrease in tiny left apical pneumothorax, less than 5%.  Normal heart size.  No pleural fluid.  Clear lungs.  IMPRESSION: Left-sided chest tube remains in place, with decreased tiny left apical pneumothorax.   Original Report Authenticated By: Jeronimo Greaves, M.D.     General appearance: alert, cooperative and no distress Resp: clear to auscultation bilaterally Cardio: regular rate and rhythm, S1, S2 normal, + murmur,  No click, rub or gallop GI: soft, non-tender; bowel sounds normal; no masses,  no organomegaly  Assessment/Plan: Fall  Left PTX s/p CT -- repeat CXR today Right acet fx/dislocation s/p ORIF -- radiation on 09/09/12 Mult lumbar TVP fxs  ABL anemia -- Improved  Mult med probs -- Home meds  FEN -- Tolerating oral pain meds. Cont. bowel regimen.  VTE -- Lovenox, SCD's. BLE dopplers negative.  Dispo --  PT/OT-CIR placement-patient ready today    Aris Georgia, PA-C Pager: [phone] General Trauma PA Pager: [phone]   09/11/2012

## 2012-09-11 NOTE — Discharge Summary (Signed)
  Physician Discharge Summary  Patient ID: [name] MRN: [id number] DOB/AGE: 53-Sep-1962 53 y.o.  Admit date: 09/05/2012 Discharge date: 09/11/2012  Discharge Diagnoses Patient Active Problem List   Diagnosis Date Noted  . Acute blood loss anemia 09/09/2012  . prophylaxis against hetereotopic ossification with right hip radiotherapy 09/09/2012  . Fall 09/07/2012  . Asthma 09/07/2012  . Pneumothorax on left 09/06/2012  . Pneumomediastinum 09/06/2012  . Right acetabular fracture 09/06/2012  . Hip dislocation, right 09/06/2012  . Lumbar transverse process fracture 09/06/2012  . Coronary artery disease 11/11/2011  . HYPERLIPIDEMIA 09/24/2010  . OBSTRUCTIVE SLEEP APNEA 09/24/2010  . HYPERTENSION 09/24/2010  . ALLERGIC RHINITIS 09/24/2010    Consultants Dr. Myrene Galas (Ortho) Dr. Noland Fordyce (Rehab)  Procedures Dr. [name] (09/08/12) 1. Open reduction internal fixation of the left posterior wall.  2. correction of marginal impaction.   HPI & Hospital Course: [name] is a 51 y.o. 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 There was no reported of loss of consciousness. Patient noted immediate back pain and right hip pain.   Cranial CT scan was negative. X-rays and imaging revealed a left pneumothorax, pneumomediastinum, right hip dislocation with posterior acetabular fracture as well as lumbar 2-4 left transverse process fracture and left flank hematoma. A chest tube was inserted. Patient underwent ORIF for right acetabular fracture 09/08/2012 per Dr. [name]. Advised touchdown weightbearing to right lower extremity and posterior hip cautions. Placed on Coumadin for DVT prophylaxis. Venous Doppler studies lower extremities 09/08/2012 negative. Chest tube removed 09/10/2012 . Patient received radiation for HO prophylaxis 09/09/2012.   Physical and occupational therapy evaluations completed and ongoing with  recommendations of physical medicine rehabilitation consult to consider inpatient rehabilitation services. On HD #7 the patient was voiding well, tolerating diet, pain well controlled, vital signs stable.  Patient was felt to be a good candidate for inpatient rehabilitation services and will be admitted for a comprehensive rehabilitation program.      Medication List     As of 09/11/2012  1:53 PM    TAKE these medications         acetaminophen 500 MG tablet   Commonly known as: TYLENOL   Take 500 mg by mouth every 6 (six) hours as needed. For pain/fever      aspirin 325 MG tablet   Take 325 mg by mouth daily.      atorvastatin 20 MG tablet   Commonly known as: LIPITOR   Take 1 tablet (20 mg total) by mouth daily.      cetirizine 10 MG tablet   Commonly known as: ZYRTEC   Take 10 mg by mouth daily.      Melatonin 3 MG Tabs   Take 1 tablet by mouth at bedtime as needed. For sleep            Signed: Rueben Bash. Dort, [facility]  Trauma Service [phone]  09/11/2012, 11:43 AM

## 2012-09-11 NOTE — Progress Notes (Signed)
I await insurance determination for admission to inpt rehab today. [phone]

## 2012-09-12 ENCOUNTER — Inpatient Hospital Stay (HOSPITAL_COMMUNITY): Payer: BC Managed Care – PPO | Admitting: Physical Therapy

## 2012-09-12 ENCOUNTER — Inpatient Hospital Stay (HOSPITAL_COMMUNITY): Payer: BC Managed Care – PPO

## 2012-09-12 LAB — PROTIME-INR
INR: 1.62 — ABNORMAL HIGH (ref 0.00–1.49)
Prothrombin Time: 18.7 seconds — ABNORMAL HIGH (ref 11.6–15.2)

## 2012-09-12 MED ORDER — WARFARIN SODIUM 7.5 MG PO TABS
7.5000 mg | ORAL_TABLET | Freq: Once | ORAL | Status: AC
  Administered 2012-09-12: 7.5 mg via ORAL
  Filled 2012-09-12: qty 1

## 2012-09-12 NOTE — Progress Notes (Signed)
Patient ID: [name], male   DOB: [birth year], 51 y.o.   MRN: [id number]  12/7.  Comfortable status post ORIF for right acetabular fracture 4 days ago. No concerns or complaints. ENT unremarkable. Chest clear. Cardiovascular normal heart sounds no murmurs. Abdomen benign. Extremities no edema.  BP Readings from Last 3 Encounters:  09/12/12 126/60  09/11/12 120/52  09/11/12 120/52    Lab Results  Component Value Date   INR 1.62* 09/12/2012   INR 1.35 09/11/2012   INR 1.11 09/10/2012    H&P signed by Ranelle Oyster, MD at 09/11/12 1605     Author: Ranelle Oyster, MD Service: Physical Medicine and Rehabilitation Author Type: Physician    Filed: 09/11/12 1605 Note Time: 09/11/12 1601           Physical Medicine and Rehabilitation Admission H&P   Chief Complaint    Patient presents with    .   Fall     :   HPI: [name] is a 51 y.o. right-handed male admitted 09/05/2012 after a fall from roof while cleaning leaves. There was no report of loss of consciousness. Patient noted immediate back pain and right hip pain. Cranial CT scan was negative. X-rays and imaging revealed a left pneumothorax, pneumomediastinum, right hip dislocation with posterior acetabular fracture as well as lumbar 2-4 left transverse process fracture and left flank hematoma. A chest tube was inserted. Patient underwent ORIF for right acetabular fracture 09/08/2012 per Dr. [name]. Advised touchdown weightbearing to right lower extremity and posterior hip cautions. Placed on Coumadin for DVT prophylaxis. Venous Doppler studies lower extremities 09/08/2012 negative. Chest tube removed 09/10/2012 . Patient received radiation for HO prophylaxis 09/09/2012. Physical and occupational therapy evaluations completed an ongoing with recommendations of physical medicine rehabilitation consult to consider inpatient rehabilitation services. Patient was felt to be a good candidate for inpatient rehabilitation services and was  admitted for a comprehensive rehabilitation program   Review of Systems   Musculoskeletal: Positive for myalgias.   All other systems reviewed and are negative   Past Medical History    Diagnosis   Date    .   Left inguinal hernia      .   Right inguinal hernia      .   Hypertension      .   Hyperlipidemia      .   Asthma      .   Allergy      .   Postsurgical aortocoronary bypass status      .   CAD (coronary artery disease)          Past Surgical History    Procedure   Date    .   Double coronary artery bypass      .   Hernia repair          left and right inguinal    .   Orif acetabular fracture   09/08/2012        Procedure: OPEN REDUCTION INTERNAL FIXATION (ORIF) ACETABULAR FRACTURE; Surgeon: Budd Palmer, MD; Location: [facility] OR; Service: Orthopedics; Laterality: Right;        Family History    Problem   Relation   Age of Onset    .   Hypertension   Mother      .   Hyperlipidemia   Mother      .   Hypertension   Father      .   Hyperlipidemia  Father         Social History: reports that he has never smoked. He does not have any smokeless tobacco history on file. He reports that he does not drink alcohol or use illicit drugs.   Allergies:   Allergies    Allergen   Reactions    .   Cefaclor          REACTION: rash        Medications Prior to Admission    Medication   Sig   Dispense   Refill    .   acetaminophen (TYLENOL) 500 MG tablet   Take 500 mg by mouth every 6 (six) hours as needed. For pain/fever        .   aspirin 325 MG tablet   Take 325 mg by mouth daily.        Marland Kitchen   atorvastatin (LIPITOR) 20 MG tablet   Take 1 tablet (20 mg total) by mouth daily.   90 tablet   3    .   cetirizine (ZYRTEC) 10 MG tablet   Take 10 mg by mouth daily.        .   Melatonin 3 MG TABS   Take 1 tablet by mouth at bedtime as needed. For sleep           Home:   Home Living   Lives With: Spouse   Available Help at Discharge: Available 24 hours/day   Type of Home: House   Home  Access: Stairs to enter   [employer] of Steps: 3   Entrance Stairs-Rails: None   Home Layout: Two level;Able to live on main level with bedroom/bathroom   Alternate Level Stairs-Number of Steps: 14   Bathroom Shower/Tub: Walk-in shower;Door   [employer] Toilet: Standard   Bathroom Accessibility: Yes   How Accessible: Accessible via walker   Home Adaptive Equipment: None   Functional History:   Prior Function   Able to Take Stairs?: Yes   Driving: Yes   Vocation: Full time employment   Comments: Acupuncturist, 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, PTA was traveling by plane fairly often   Functional Status:   Mobility:   Bed Mobility   Bed Mobility: Supine to Sit;Sit to Supine;Sitting - Scoot to Edge of Bed   Supine to Sit: 1: +2 Total assist;HOB elevated;With rails   Supine to Sit: Patient Percentage: 50%   Sitting - Scoot to Edge of Bed: 1: +2 Total assist   Sitting - Scoot to Edge of Bed: Patient Percentage: 70%   Sit to Supine: 1: +2 Total assist   Sit to Supine: Patient Percentage: 30%   Transfers   Transfers: Sit to Stand;Stand to Sit   Sit to Stand: 1: +2 Total assist;With upper extremity assist;From bed;From elevated surface   Sit to Stand: Patient Percentage: 70%   Stand to Sit: 1: +2 Total assist;With upper extremity assist;To chair/3-in-1   Stand to Sit: Patient Percentage: 70%   Ambulation/Gait   Ambulation/Gait Assistance: Not tested (comment)   Stairs: No   Wheelchair Mobility   Wheelchair Mobility: No   ADL:   ADL   Eating/Feeding: Independent   Grooming: Therapist, nutritional;Set up   Where Assessed - Grooming: Unsupported sitting   Upper Body Bathing: Minimal assistance   Where Assessed - Upper Body Bathing: Supported sitting   Lower Body Bathing: +1 Total assistance   Where Assessed - Lower Body Bathing: Supported sit to stand   Upper Body Dressing:  Minimal assistance   Where Assessed - Upper Body Dressing: Supported sitting   Lower Body Dressing: +1  Total assistance   Where Assessed - Lower Body Dressing: Supported sit to Database administrator: +2 Total assistance   Toilet Transfer Method: Stand pivot   Toilet Transfer Equipment: Raised toilet seat with arms (or 3-in-1 over toilet)   Equipment Used: Gait belt;Rolling walker   Transfers/Ambulation Related to ADLs: pt completed EOB to chair stand pivot to chair. Pt demonstrates small hop during transfer.   ADL Comments: pt educated on back precautions with adls and bed mobility. pt provided pillow between knees to log roll to the Left side now that chest tube is removed. Pt tolerated well. Pt demonstrates rotating downward nystagmus with log roll to left. pt experiences nausea with log rolling. Pt and wife educated on nystagums and correlation with falls with vestibular deficits. pt currently is not safe to complete vestibular exercises for treatment. Pt position in chair with ice for edema management. Pt with large bruise on Lt hip area noted.   Cognition:   Cognition   Arousal/Alertness: Awake/alert   Orientation Level: Oriented X4   Cognition   Overall Cognitive Status: Appears within functional limits for tasks assessed/performed   Arousal/Alertness: Awake/alert   Orientation Level: Appears intact for tasks assessed   Behavior During Session: [facility] for tasks performed   Blood pressure 120/52, pulse 60, temperature 97.4 F (36.3 C), temperature source Oral, resp. rate 18, height 5\' 9"  (1.753 m), weight 74.707 kg (164 lb 11.2 oz), SpO2 98.00%.   Physical Exam  Vitals reviewed.   Constitutional: He is oriented to person, place, and time. He appears well-developed. NAD HENT: oral mucosa pink and moist.   Head: Normocephalic.   Eyes:  Pupils round and reactive to light   Neck: Neck supple. No thyromegaly present.   Cardiovascular: Normal rate and regular rhythm.   Pulmonary/Chest: Breath sounds normal. No respiratory distress. Mild crackles left base Abdominal: Soft. Bowel sounds are  hypoactive. He exhibits no distension.  Musculoskeletal:  Right Leg appropriately tender.  Neurological: He is alert and oriented to person, place, and time. No cranial nerve deficit.  UE 5/5. LLE is 3-4 prox to 4/5 distally RLE is 1-2 prox to 3-4 distally. No sensory deficits.  Skin:  Chest tube site healing with minimal drainage and pink granulation tissue. Right hip incision is clean and intact with staples and minimal drainage. Mild surrounding edema. Psychiatric: He has a normal mood and affect. His behavior is normal. Judgment and thought content normal   Results for orders placed during the hospital encounter of 09/05/12 (from the past 48 hour(s))    CBC Status: Abnormal      Collection Time      09/10/12 6:25 AM    Component   Value   Range   Comment      WBC   7.9   4.0 - 10.5 K/uL        RBC   2.95 (*)   4.22 - 5.81 MIL/uL        Hemoglobin   9.2 (*)   13.0 - 17.0 g/dL        HCT   86.5 (*)   39.0 - 52.0 %        MCV   90.5   78.0 - 100.0 fL        MCH   31.2   26.0 - 34.0 pg        MCHC  34.5   30.0 - 36.0 g/dL        RDW   78.2   95.6 - 15.5 %        Platelets   185   150 - 400 K/uL      PROTIME-INR Status: Normal      Collection Time      09/10/12 6:25 AM    Component   Value   Range   Comment      Prothrombin Time   14.2   11.6 - 15.2 seconds        INR   1.11   0.00 - 1.49      PROTIME-INR Status: Abnormal      Collection Time      09/11/12 6:13 AM    Component   Value   Range   Comment      Prothrombin Time   16.4 (*)   11.6 - 15.2 seconds        INR   1.35   0.00 - 1.49         Dg Chest Port 1 View   09/10/2012 *RADIOLOGY REPORT* Clinical Data: Trauma. Chest tube. Fall. PORTABLE CHEST - 1 VIEW Comparison: 1 day prior Findings: Prior median sternotomy. Left-sided chest tube is unchanged in position. Decrease in tiny left apical pneumothorax, less than 5%. Normal heart size. No pleural fluid. Clear lungs. IMPRESSION: Left-sided chest tube remains in place, with  decreased tiny left apical pneumothorax. Original Report Authenticated By: Jeronimo Greaves, M.D.     Post Admission Physician Evaluation:   Functional deficits secondary to Right hip dislocation, acetabular fx after fall. Patient is admitted to receive collaborative, interdisciplinary care between the physiatrist, rehab nursing staff, and therapy team. Patient's level of medical complexity and substantial therapy needs in context of that medical necessity cannot be provided at a lesser intensity of care such as a SNF. Patient has experienced substantial functional loss from his/her baseline which was documented above under the "Functional History" and "Functional Status" headings. Judging by the patient's diagnosis, physical exam, and functional history, the patient has potential for functional progress which will result in measurable gains while on inpatient rehab. These gains will be of substantial and practical use upon discharge in facilitating mobility and self-care at the household level. Physiatrist will provide 24 hour management of medical needs as well as oversight of the therapy plan/treatment and provide guidance as appropriate regarding the interaction of the two. 24 hour rehab nursing will assist with bladder management, bowel management, safety, skin/wound care, disease management, medication administration, pain management and patient education and help integrate therapy concepts, techniques,education, etc. PT will assess and treat for: .Lower extremity strength, range of motion, stamina, balance, functional mobility, safety, adaptive techniques and equipment, pain mgt, wb. Goals are: supervision to minimal assist. OT will assess and treat for: ADL's, functional mobility, safety, upper extremity strength, adaptive techniques and equipment, pain mgt, . Goals are: supervision to minimal assist. SLP will assess and treat for: n/a. Goals are: n/a. Case Management and Social Worker will assess  and treat for psychological issues and discharge planning. Team conference will be held weekly to assess progress toward goals and to determine barriers to discharge. Patient will receive at least 3 hours of therapy per day at least 5 days per week. ELOS: 7 days Prognosis: excellent Medical Problem List and Plan:   1. Right hip dislocation with acetabular fracture after fall status post ORIF 09/08/2012   2. DVT Prophylaxis/Anticoagulation: Coumadin for  DVT prophylaxis. Monitor for any bleeding episodes. Venous Doppler studies 09/08/2012 negative   3. Pain Management: Hydrocodone and Robaxin as needed. Monitor with increased mobility   4. Neuropsych: This patient is capable of making decisions on his/her own behalf.   5. Lumbar 2-4 transverse process fracture with left flank hematoma. No back brace indicated.   6. Left pneumothorax. Chest tube removed 09/10/2012. No shortness of breath noted. Check oxygen saturations every shift   7. Hyperlipidemia. Lipitor 8. Constipation: augment bowel regimen    Ivory Broad, MD 09/11/2012

## 2012-09-12 NOTE — Evaluation (Signed)
Occupational Therapy Assessment and Plan  Patient Details  Name: [name] MRN: [id number] Date of Birth: 1961-03-24  OT Diagnosis: acute pain, muscle weakness (generalized) and pain in joint Rehab Potential: Rehab Potential: Good ELOS: 7-10 days   Today's Date: 09/12/2012 Time: 1035-1140 Time Calculation (min): 65 min  Problem List:  Patient Active Problem List  Diagnosis  . HYPERLIPIDEMIA  . OBSTRUCTIVE SLEEP APNEA  . HYPERTENSION  . ALLERGIC RHINITIS  . Coronary artery disease  . Pneumothorax on left  . Pneumomediastinum  . Right acetabular fracture  . Hip dislocation, right  . Lumbar transverse process fracture  . Fall  . Asthma  . Acute blood loss anemia  . prophylaxis against hetereotopic ossification with right hip radiotherapy    Past Medical History:  Past Medical History  Diagnosis Date  . Left inguinal hernia   . Right inguinal hernia   . Hypertension   . Hyperlipidemia   . Asthma   . Allergy   . Postsurgical aortocoronary bypass status   . CAD (coronary artery disease)    Past Surgical History:  Past Surgical History  Procedure Date  . Double coronary artery bypass   . Hernia repair     left and right inguinal  . Orif acetabular fracture 09/08/2012    Procedure: OPEN REDUCTION INTERNAL FIXATION (ORIF) ACETABULAR FRACTURE;  Surgeon: Budd Palmer, MD;  Location: [facility] OR;  Service: Orthopedics;  Laterality: Right;    Assessment & Plan Clinical Impression: Patient is a 51 y.o. year old right-handed male admitted 09/05/2012 after a fall from roof while cleaning leaves. There was no report of loss of consciousness. Patient noted immediate back pain and right hip pain. Cranial CT scan was negative. X-rays and imaging revealed a left pneumothorax, pneumomediastinum, right hip dislocation with posterior acetabular fracture as well as lumbar 2-4 left transverse process fracture and left flank hematoma. A chest tube was inserted. Patient underwent ORIF  for right acetabular fracture 09/08/2012 per Dr. [name]. Advised touchdown weightbearing to right lower extremity and posterior hip cautions.   Patient transferred to CIR on 09/11/2012 .    Patient currently requires min with basic self-care skills secondary to muscle joint tightness.  Prior to hospitalization, patient could complete BADL and IADL independently.  Patient will benefit from skilled intervention to increase independence with basic self-care skills prior to discharge home with care partner.  Anticipate patient will require minimal physical assistance and no further OT follow recommended.  OT - End of Session Activity Tolerance: Tolerates 30+ min activity with multiple rests Endurance Deficit: Yes Endurance Deficit Description: limited by pain OT Assessment Rehab Potential: Good Barriers to Discharge: None OT Plan OT Frequency: 1-2 X/day, 60-90 minutes Estimated Length of Stay: 7-10 days OT Treatment/Interventions: Discharge planning;DME/adaptive equipment instruction;Pain management;Patient/family education;Self Care/advanced ADL retraining;Therapeutic Activities;Therapeutic Exercise;UE/LE Strength taining/ROM;UE/LE Coordination activities;Functional mobility training OT Recommendation Follow Up Recommendations: None Equipment Recommended: Rolling walker with 5" wheels  OT Evaluation Precautions/Restrictions  Precautions Precautions: Posterior Hip Precaution Booklet Issued: Yes (comment) Precaution Comments: precautions explained Restrictions Weight Bearing Restrictions: Yes RLE Weight Bearing: Touchdown weight bearing Other Position/Activity Restrictions: LLE WBAT, also reviewed log rolling for protection of lumbar fxs  General Chart Reviewed: Yes Family/Caregiver Present: Yes  Pain Pain Assessment Pain Assessment: 0-10 Pain Score:   2 Pain Type: Acute pain Pain Orientation: Posterior Pain Descriptors: Shooting Pain Onset: With Activity Pain Intervention(s):  Medication (See eMAR) Multiple Pain Sites: No  Home Living/Prior Functioning Home Living Lives With: Spouse Tora Duck) Available Help  at Discharge: Available 24 hours/day Type of Home: House Home Access: Stairs to enter [employer] of Steps: 3 (6" steps) Entrance Stairs-Rails: None Home Layout: Two level;Able to live on main level with bedroom/bathroom Alternate Level Stairs-Number of Steps: 14 Alternate Level Stairs-Rails: Right;Left;Can reach both Bathroom Shower/Tub: Walk-in shower;Door [employer] Toilet: Standard Bathroom Accessibility: Yes How Accessible: Accessible via walker Home Adaptive Equipment: None Prior Function Level of Independence:  (Independent PTA) Able to Take Stairs?: Yes Driving: Yes 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, 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 Leisure: Hobbies-yes (Comment) Comments: home projects,   Vision/Perception  Vision - History Baseline Vision: Bifocals (transition biocals) Patient Visual Report: Nausea/blurring vision with head movement Vision - Assessment Eye Alignment: Within Functional Limits Additional Comments: normally wears glasses all the time, but has had difficulty with vestibular issues since fall Perception Perception: Within Functional Limits Praxis Praxis: Intact   Cognition Overall Cognitive Status: Appears within functional limits for tasks assessed Arousal/Alertness: Awake/alert  Sensation Sensation Light Touch: Appears Intact Additional Comments: per pt report no numbness or tingling.    Motor  Motor Motor: Within Functional Limits  Mobility  Bed Mobility Rolling Left: 4: Min assist;With rail Left Sidelying to Sit: 4: Min assist;With rails;HOB elevated Sitting - Scoot to Edge of Bed: 6: Modified independent (Device/Increase time);With rail Transfers Sit to Stand: 4: Min assist;With upper extremity assist;From bed Stand to Sit: 4: Min assist;With armrests;With upper  extremity assist;To chair/3-in-1   Trunk/Postural Assessment  Postural Control Postural Control: Within Functional Limits   Balance Static Sitting Balance Static Sitting - Balance Support: Right upper extremity supported;Left upper extremity supported;Feet supported Static Sitting - Level of Assistance: 5: Stand by assistance  Extremity/Trunk Assessment RUE Assessment RUE Assessment: Within Functional Limits LUE Assessment LUE Assessment: Within Functional Limits  See FIM for current functional status Refer to Care Plan for Long Term Goals  Recommendations for other services: None  Discharge Criteria: Patient will be discharged from OT if patient refuses treatment 3 consecutive times without medical reason, if treatment goals not met, if there is a change in medical status, if patient makes no progress towards goals or if patient is discharged from hospital.  The above assessment, treatment plan, treatment alternatives and goals were discussed and mutually agreed upon: by patient and by family  First session: Time: 1035-1140 Time Calculation (min):  65 min  Pain Assessment: 2/10  Skilled Therapeutic Interventions: 1:1 OT initial eval completed with emphasis on pain management, transfers, patient ed on use of DME/AE.  Patient able to use reacher and sock aid during assisted ADL this date.    See FIM for current functional status  Therapy/Group: Individual Therapy   Second session: Time: 1500-1530 Time Calculation (min):  30 min  Pain Assessment: 0/10  Skilled Therapeutic Interventions: ADL-retraining with emphasis on effective use of DME for bathing and toileting.  Patient complete transfer to tub bench in walk-in shower but was advised to modify technique to avoid trunk rotation and hip external rotation while attempting to pivot.   Bench replaced with shower chair.  Patient reported concern that his initial "adrenaline" for therapy would diminish. Pt educated on methods  to improve and sustain endurance.  See FIM for current functional status  Therapy/Group: Individual Therapy   Georgeanne Nim 09/12/2012, 5:37 PM

## 2012-09-12 NOTE — Evaluation (Addendum)
Physical Therapy Assessment and Plan  Patient Details  Name: [name] MRN: [id number] Date of Birth: January 31, 1961  PT Diagnosis: Abnormality of gait, Difficulty walking, Low back pain, Muscle weakness, Pain in joint and Vertigo Rehab Potential: Good ELOS: 7-10 days   Today's Date: 09/12/2012 Time: Session #1/Evaluation: [id number], Session #2: [id number] Time Calculation (min): Session #1: 60 min, Session #2: 30 min  Problem List:  Patient Active Problem List  Diagnosis  . HYPERLIPIDEMIA  . OBSTRUCTIVE SLEEP APNEA  . HYPERTENSION  . ALLERGIC RHINITIS  . Coronary artery disease  . Pneumothorax on left  . Pneumomediastinum  . Right acetabular fracture  . Hip dislocation, right  . Lumbar transverse process fracture  . Fall  . Asthma  . Acute blood loss anemia  . prophylaxis against hetereotopic ossification with right hip radiotherapy    Past Medical History:  Past Medical History  Diagnosis Date  . Left inguinal hernia   . Right inguinal hernia   . Hypertension   . Hyperlipidemia   . Asthma   . Allergy   . Postsurgical aortocoronary bypass status   . CAD (coronary artery disease)    Past Surgical History:  Past Surgical History  Procedure Date  . Double coronary artery bypass   . Hernia repair     left and right inguinal  . Orif acetabular fracture 09/08/2012    Procedure: OPEN REDUCTION INTERNAL FIXATION (ORIF) ACETABULAR FRACTURE;  Surgeon: Budd Palmer, MD;  Location: [facility] OR;  Service: Orthopedics;  Laterality: Right;    Assessment & Plan Clinical Impression: [name] is a 51 y.o. right-handed male admitted 09/05/2012 after a fall from roof while cleaning leaves. There was no report of loss of consciousness. Patient noted immediate back pain and right hip pain. Cranial CT scan was negative. X-rays and imaging revealed a left pneumothorax, pneumomediastinum, right hip dislocation with posterior acetabular fracture as well as lumbar 2-4 left  transverse process fracture and left flank hematoma. A chest tube was inserted. Patient underwent ORIF for right acetabular fracture 09/08/2012 per Dr. [name]. Advised touchdown weightbearing to right lower extremity and posterior hip cautions. Placed on Coumadin for DVT prophylaxis. Venous Doppler studies lower extremities 09/08/2012 negative. Chest tube removed 09/10/2012 . Patient received radiation for HO prophylaxis 09/09/2012.  Patient transferred to CIR on 09/11/2012 .   Patient currently requires mod with mobility secondary to muscle weakness and muscle joint tightness and peripheral.  Prior to hospitalization, patient was completely independent with mobility and lived with Spouse Teacher, music) in a House home.  Home access is 3 (6" steps)Stairs to enter.  Patient will benefit from skilled PT intervention to maximize safe functional mobility, minimize fall risk and decrease caregiver burden for planned discharge home with 24 hour assist.  Anticipate patient will benefit from follow up [facility] at discharge.  PT - End of Session Activity Tolerance: Tolerates 30+ min activity without fatigue Endurance Deficit: Yes Endurance Deficit Description: limited by pain PT Assessment Rehab Potential: Good Barriers to Discharge: None PT Plan PT Frequency: 2-3 X/day, 60-90 minutes Estimated Length of Stay: 7-10 days PT Treatment/Interventions: Ambulation/gait training;Balance/vestibular training;Community reintegration;Discharge planning;DME/adaptive equipment instruction;Functional mobility training;Neuromuscular re-education;Pain management;Patient/family education;Psychosocial support;Stair training;Therapeutic Exercise;Therapeutic Activities;UE/LE Strength taining/ROM;UE/LE Coordination activities;Wheelchair propulsion/positioning;Visual/perceptual remediation/compensation PT Recommendation Recommendations for Other Services: Vestibular eval Follow Up Recommendations: Home health PT;24 hour  supervision/assistance Equipment Recommended: Rolling walker with 5" wheels Equipment Details: may want to rent a WC for community access, but will determine prior to d/c.   PT Evaluation Precautions/Restrictions  Precautions Precautions: Posterior Hip Precaution Booklet Issued: Yes (comment) Precaution Comments: precautions explained Restrictions Weight Bearing Restrictions: Yes RLE Weight Bearing: Touchdown weight bearing Other Position/Activity Restrictions: LLE WBAT, also reviewed log rolling for protection of lumbar fxs     Pain Pain Assessment Pain Assessment: 0-10 Pain Score:   2 Pain Type: Acute pain Pain Orientation: Posterior Pain Descriptors: Shooting Pain Onset: With Activity Pain Intervention(s): Medication (See eMAR) Multiple Pain Sites: No Home Living/Prior Functioning Home Living Lives With: Spouse Tora Duck) Available Help at Discharge: Available 24 hours/day Type of Home: House Home Access: Stairs to enter [employer] of Steps: 3 (6" steps) Entrance Stairs-Rails: None Home Layout: Two level;Able to live on main level with bedroom/bathroom Alternate Level Stairs-Number of Steps: 14 Alternate Level Stairs-Rails: Right;Left;Can reach both Bathroom Shower/Tub: Walk-in shower;Door [employer] Toilet: Standard Bathroom Accessibility: Yes How Accessible: Accessible via walker Home Adaptive Equipment: None Prior Function Level of Independence:  (Independent PTA) Able to Take Stairs?: Yes Driving: Yes 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, 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 Leisure: Hobbies-yes (Comment) Comments: home projects,  Vision/Perception  Vision - History Baseline Vision: Bifocals (transition biocals) Patient Visual Report: Nausea/blurring vision with head movement Vision - Assessment Eye Alignment: Within Functional Limits Additional Comments: normally wears glasses all the time, but has had difficulty with  vestibular issues since fall Perception Perception: Within Functional Limits Praxis Praxis: Intact  Cognition Overall Cognitive Status: Appears within functional limits for tasks assessed Arousal/Alertness: Awake/alert Sensation Sensation Light Touch: Appears Intact Additional Comments: per pt report no numbness or tingling.   Motor     Mobility Bed Mobility Rolling Left: 4: Min assist;With rail Left Sidelying to Sit: 4: Min assist;With rails;HOB elevated Sitting - Scoot to Edge of Bed: 6: Modified independent (Device/Increase time);With rail Transfers Sit to Stand: 4: Min assist;With upper extremity assist;From bed Stand to Sit: 4: Min assist;With armrests;With upper extremity assist;To chair/3-in-1 Locomotion  Ambulation Ambulation: Yes Ambulation/Gait Assistance: 4: Min assist Ambulation Distance (Feet): 65 Feet Assistive device: Rolling walker Ambulation/Gait Assistance Details: step to pattern, pt able to maintain TDWB R leg throughout session.   Stairs / Additional Locomotion Stairs: Yes Stairs Assistance: 3: Mod assist Stairs Assistance Details (indicate cue type and reason): mod assist to support trunk while step/hopping up on left leg to help maintain TDWB right.  Verbal cues for technique and sequencing.   Stair Management Technique: Two rails;Step to pattern;Forwards Number of Stairs: 2  Height of Stairs: 6  Corporate treasurer: Yes Wheelchair Assistance: 5: Financial planner Details: Verbal cues for Diplomatic Services operational officer: Both upper extremities Wheelchair Parts Management: Needs assistance Distance: 150      Games developer Sitting - Balance Support: Right upper extremity supported;Left upper extremity supported;Feet supported Static Sitting - Level of Assistance: 5: Stand by assistance Extremity Assessment      RLE Assessment RLE Assessment: Exceptions to [facility] RLE Strength RLE  Overall Strength Comments: R leg limited by hip pain, ankle 3/5, knee 3-/5, hip 2+/5 per gross assessment.   LLE Assessment LLE Assessment: Within Functional Limits   Skilled Interventions:  Session #1/Evaluation: This session focused on bed mobility following both his posterior hip precautions for his right leg and his back precautions.  Log rolled left with pillow between his knees to keep from adducting his right hip min assist, left side lying to sit min assist with HOB 30 degrees and bed rail used.  Min assist to stabilize trunk verbal cues to  try to avoid twisting.  Sitting scoot to EOB mod I using bed rail and extra time needed.  Pt able to report 2/3 posterior hip precautions and 3/3 back precautions.  Sit to stand min assist to RW.  Min assist to steady trunk during transition of hand from bed rail to walker.  Min assist to stand pivot to [facility] with RW steadying assist.  WC mobility 150' with supervision assist with WC parts (leg rests) education for safe use of WC (breaks).  Gait up and down 2 stairs with mod assist bil rails.  Mod assist to support trunk during hop steps so that he could maintain TDWB on his right foot.  He will need to be able to do stairs without rails to get home.  Gait with RW 65' min assist for balance, cues for upright posture, lowered walker one notch to be better for his height and arm length.    Session #2: This session focused on bed mobility to the right side of the bed.  Pt showed me how he got out of the bed earlier which was to slide both legs over the edge and sit up with HOB elevated and leg rest.  He was able to do this min assist, but I explained to him that he was putting more strain on his back doing this method and he would not have the elevating HOB at home which means even more strain on the back to get up that way.  Sit to stand from bed with cues for safe hand placement to RW min assist, slow transition.  Gait 200' with RW min guard assist, 100' with RW min  guard assist this time across carpet.  Cues to step (or hop) not slide his left foot forward on the ground.  Adjusted RW height back up one (it was too low after all- he just keeps his shoulders shrugged and needs cues to relax them down during standing rest breaks).  WC to bed transfer with RW min assist.  Sit to side lying left with pillow between his knees mod assist to help him get both legs into the bed.  Leg exercises in the bed: ankle pumps bil x 20 reps, quad sets right x 10 reps, quad sets right x 10 reps with 5 second holds, SAQ x 10 reps right, Heel slides AAROM x 10 reps right, hip abduction x 10 reps right, seated LAQ x 10 reps right.    See FIM for current functional status Refer to Care Plan for Long Term Goals  Recommendations for other services: Other: Vestibular evaluation  Discharge Criteria: Patient will be discharged from PT if patient refuses treatment 3 consecutive times without medical reason, if treatment goals not met, if there is a change in medical status, if patient makes no progress towards goals or if patient is discharged from hospital.  The above assessment, treatment plan, treatment alternatives and goals were discussed and mutually agreed upon: by patient and by family  Lurena Joiner B. Nahum Sherrer, PT, DPT [phone]   09/12/2012, 12:23 PM

## 2012-09-12 NOTE — Plan of Care (Signed)
Problem: RH Balance Goal: LTG: Patient will maintain dynamic sitting balance (OT) LTG: Patient will maintain dynamic sitting balance with assistance during activities of daily living (OT) LTG = STG due to estimated short LOS

## 2012-09-12 NOTE — Progress Notes (Signed)
ANTICOAGULATION CONSULT NOTE - Follow up Consult  Pharmacy Consult:  Coumadin Indication: VTE prophylaxis  Allergies  Allergen Reactions  . Cefaclor     REACTION: rash    Patient Measurements: Height: 5\' 9"  (175.3 cm) IBW/kg (Calculated) : 70.7   Vital Signs: Temp: 99.4 F (37.4 C) (12/07 0615) Temp src: Oral (12/07 0615) BP: 126/60 mmHg (12/07 0615) Pulse Rate: 81  (12/07 0813)  Labs:  Alvira Philips 09/12/12 0625 09/11/12 1610 09/10/12 0625  HGB -- -- 9.2*  HCT -- -- 26.7*  PLT -- -- 185  APTT -- -- --  LABPROT 18.7* 16.4* 14.2  INR 1.62* 1.35 1.11  HEPARINUNFRC -- -- --  CREATININE -- -- --  CKTOTAL -- -- --  CKMB -- -- --  TROPONINI -- -- --    The CrCl is unknown because both a height and weight (above a minimum accepted value) are required for this calculation.      Assessment: 51 y/o male patient admitted with acetabular fracture, now s/p ORIF requiring Coumadin for VTE prophylaxis.  Patient continues on Lovenox bridge.  INR trending up toward normal, no bleeding reported.   Goal of Therapy:  INR 2-3 Monitor platelets by anticoagulation protocol: Yes    Plan:  - Coumadin 7.5mg  PO today - Continue Lovenox until INR therapeutic - Daily PT / INR    Rosselyn Martha D. Laney Potash, PharmD, BCPS Pager:  [phone] 09/12/2012, 11:13 AM

## 2012-09-12 NOTE — Plan of Care (Signed)
Problem: RH BOWEL ELIMINATION Goal: RH STG MANAGE BOWEL WITH ASSISTANCE STG Manage Bowel with mod I Assistance.  Outcome: Not Progressing Patient taking scheduled Laxatives. No results at this time

## 2012-09-13 ENCOUNTER — Inpatient Hospital Stay (HOSPITAL_COMMUNITY): Payer: BC Managed Care – PPO | Admitting: Physical Therapy

## 2012-09-13 LAB — PROTIME-INR
INR: 1.75 — ABNORMAL HIGH (ref 0.00–1.49)
Prothrombin Time: 19.8 seconds — ABNORMAL HIGH (ref 11.6–15.2)

## 2012-09-13 MED ORDER — WARFARIN SODIUM 10 MG PO TABS
10.0000 mg | ORAL_TABLET | Freq: Once | ORAL | Status: AC
  Administered 2012-09-13: 10 mg via ORAL
  Filled 2012-09-13: qty 1

## 2012-09-13 NOTE — Progress Notes (Signed)
ANTICOAGULATION CONSULT NOTE - Follow up Consult  Pharmacy Consult:  Coumadin Indication: VTE prophylaxis  Allergies  Allergen Reactions  . Cefaclor     REACTION: rash    Patient Measurements: Height: 5\' 9"  (175.3 cm) IBW/kg (Calculated) : 70.7   Vital Signs: Temp: 98.8 F (37.1 C) (12/08 0509) Temp src: Oral (12/08 0509) BP: 122/66 mmHg (12/08 0509) Pulse Rate: 64  (12/08 0509)  Labs:  Alvira Philips 09/13/12 0705 09/12/12 0625 09/11/12 0613  HGB -- -- --  HCT -- -- --  PLT -- -- --  APTT -- -- --  LABPROT 19.8* 18.7* 16.4*  INR 1.75* 1.62* 1.35  HEPARINUNFRC -- -- --  CREATININE -- -- --  CKTOTAL -- -- --  CKMB -- -- --  TROPONINI -- -- --    The CrCl is unknown because both a height and weight (above a minimum accepted value) are required for this calculation.      Assessment: 51 y/o male patient admitted with acetabular fracture, now s/p ORIF requiring Coumadin for VTE prophylaxis.  Patient continues on Lovenox bridge.  INR trending up toward normal, no bleeding reported.  Goal of Therapy:  INR 2-3 Monitor platelets by anticoagulation protocol: Yes    Plan:  - Coumadin 10mg  PO today - Continue Lovenox until INR therapeutic - Daily PT / INR    Vladislav Axelson D. Laney Potash, PharmD, BCPS Pager:  [phone] 09/13/2012, 10:49 AM

## 2012-09-13 NOTE — Progress Notes (Signed)
Pt has not had a BM since 11/30. Suppository offered and patient refused. Will continue to monitor.

## 2012-09-13 NOTE — Plan of Care (Signed)
Problem: RH BOWEL ELIMINATION Goal: RH STG MANAGE BOWEL WITH ASSISTANCE STG Manage Bowel with mod I Assistance.  Pt is receiving several laxatives but has not had a BM since 11/30 per report

## 2012-09-13 NOTE — Progress Notes (Signed)
Physical Therapy Note  Patient Details  Name: [name] MRN: [id number] Date of Birth: October 30, 1960 Today's Date: 09/13/2012  1400-1430 (30 minutes) individual Pain : 2/10 right hip/premedicated  Precautions : back; TDWB RT LE Focus of treatment: gait training TDWB RT LE; RT LE AROM /strengthening Treatment: Supine to sit (mat or bed ) min assist RT LE; sit to supine min assist RT LE; gait 120 feet RW TDWB RT LE SBA; RT LE - AA heel slides 2 X 10 , AA hip abduction X 10, SAQs X 20, ankle pumps X 15, quad sets X 20   Lennon Richins,JIM 09/13/2012, 7:27 AM

## 2012-09-13 NOTE — Progress Notes (Signed)
Patient ID: [name], male   DOB: 11/01/1960, 51 y.o.   MRN: [id number] Patient ID: [name], male   DOB: June 10, 1961, 51 y.o.   MRN: [id number]  12/8.  Comfortable status post ORIF for right acetabular fracture 5 days ago. No concerns or complaints. ENT unremarkable. Chest clear. Cardiovascular normal heart sounds no murmurs. Abdomen benign. Extremities no edema.  Right hip incision bandaged; SCDs in place  BP Readings from Last 3 Encounters:  09/13/12 122/66  09/11/12 120/52  09/11/12 120/52    Lab Results  Component Value Date   INR 1.75* 09/13/2012   INR 1.62* 09/12/2012   INR 1.35 09/11/2012    H&P signed by Ranelle Oyster, MD at 09/11/12 1605     Author: Ranelle Oyster, MD Service: Physical Medicine and Rehabilitation Author Type: Physician    Filed: 09/11/12 1605 Note Time: 09/11/12 1601           Physical Medicine and Rehabilitation Admission H&P   Chief Complaint    Patient presents with    .   Fall     :   HPI: [name] is a 51 y.o. right-handed male admitted 09/05/2012 after a fall from roof while cleaning leaves. There was no report of loss of consciousness. Patient noted immediate back pain and right hip pain. Cranial CT scan was negative. X-rays and imaging revealed a left pneumothorax, pneumomediastinum, right hip dislocation with posterior acetabular fracture as well as lumbar 2-4 left transverse process fracture and left flank hematoma. A chest tube was inserted. Patient underwent ORIF for right acetabular fracture 09/08/2012 per Dr. [name]. Advised touchdown weightbearing to right lower extremity and posterior hip cautions. Placed on Coumadin for DVT prophylaxis. Venous Doppler studies lower extremities 09/08/2012 negative. Chest tube removed 09/10/2012 . Patient received radiation for HO prophylaxis 09/09/2012. Physical and occupational therapy evaluations completed an ongoing with recommendations of physical medicine rehabilitation consult to  consider inpatient rehabilitation services. Patient was felt to be a good candidate for inpatient rehabilitation services and was admitted for a comprehensive rehabilitation program   Review of Systems   Musculoskeletal: Positive for myalgias.   All other systems reviewed and are negative   Past Medical History    Diagnosis   Date    .   Left inguinal hernia      .   Right inguinal hernia      .   Hypertension      .   Hyperlipidemia      .   Asthma      .   Allergy      .   Postsurgical aortocoronary bypass status      .   CAD (coronary artery disease)          Past Surgical History    Procedure   Date    .   Double coronary artery bypass      .   Hernia repair          left and right inguinal    .   Orif acetabular fracture   09/08/2012        Procedure: OPEN REDUCTION INTERNAL FIXATION (ORIF) ACETABULAR FRACTURE; Surgeon: Budd Palmer, MD; Location: [facility] OR; Service: Orthopedics; Laterality: Right;        Family History    Problem   Relation   Age of Onset    .   Hypertension   Mother      .   Hyperlipidemia  Mother      .   Hypertension   Father      .   Hyperlipidemia   Father         Social History: reports that he has never smoked. He does not have any smokeless tobacco history on file. He reports that he does not drink alcohol or use illicit drugs.   Allergies:   Allergies    Allergen   Reactions    .   Cefaclor          REACTION: rash        Medications Prior to Admission    Medication   Sig   Dispense   Refill    .   acetaminophen (TYLENOL) 500 MG tablet   Take 500 mg by mouth every 6 (six) hours as needed. For pain/fever        .   aspirin 325 MG tablet   Take 325 mg by mouth daily.        Marland Kitchen   atorvastatin (LIPITOR) 20 MG tablet   Take 1 tablet (20 mg total) by mouth daily.   90 tablet   3    .   cetirizine (ZYRTEC) 10 MG tablet   Take 10 mg by mouth daily.        .   Melatonin 3 MG TABS   Take 1 tablet by mouth at bedtime as needed. For sleep            Home:   Home Living   Lives With: Spouse   Available Help at Discharge: Available 24 hours/day   Type of Home: House   Home Access: Stairs to enter   [employer] of Steps: 3   Entrance Stairs-Rails: None   Home Layout: Two level;Able to live on main level with bedroom/bathroom   Alternate Level Stairs-Number of Steps: 14   Bathroom Shower/Tub: Walk-in shower;Door   [employer] Toilet: Standard   Bathroom Accessibility: Yes   How Accessible: Accessible via walker   Home Adaptive Equipment: None   Functional History:   Prior Function   Able to Take Stairs?: Yes   Driving: Yes   Vocation: Full time employment   Comments: Acupuncturist, 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, PTA was traveling by plane fairly often   Functional Status:   Mobility:   Bed Mobility   Bed Mobility: Supine to Sit;Sit to Supine;Sitting - Scoot to Edge of Bed   Supine to Sit: 1: +2 Total assist;HOB elevated;With rails   Supine to Sit: Patient Percentage: 50%   Sitting - Scoot to Edge of Bed: 1: +2 Total assist   Sitting - Scoot to Edge of Bed: Patient Percentage: 70%   Sit to Supine: 1: +2 Total assist   Sit to Supine: Patient Percentage: 30%   Transfers   Transfers: Sit to Stand;Stand to Sit   Sit to Stand: 1: +2 Total assist;With upper extremity assist;From bed;From elevated surface   Sit to Stand: Patient Percentage: 70%   Stand to Sit: 1: +2 Total assist;With upper extremity assist;To chair/3-in-1   Stand to Sit: Patient Percentage: 70%   Ambulation/Gait   Ambulation/Gait Assistance: Not tested (comment)   Stairs: No   Wheelchair Mobility   Wheelchair Mobility: No   ADL:   ADL   Eating/Feeding: Independent   Grooming: Therapist, nutritional;Set up   Where Assessed - Grooming: Unsupported sitting   Upper Body Bathing: Minimal assistance   Where Assessed - Upper Body Bathing: Supported sitting  Lower Body Bathing: +1 Total assistance   Where Assessed - Lower Body Bathing: Supported sit to  stand   Upper Body Dressing: Minimal assistance   Where Assessed - Upper Body Dressing: Supported sitting   Lower Body Dressing: +1 Total assistance   Where Assessed - Lower Body Dressing: Supported sit to Database administrator: +2 Total assistance   Toilet Transfer Method: Stand pivot   Toilet Transfer Equipment: Raised toilet seat with arms (or 3-in-1 over toilet)   Equipment Used: Gait belt;Rolling walker   Transfers/Ambulation Related to ADLs: pt completed EOB to chair stand pivot to chair. Pt demonstrates small hop during transfer.   ADL Comments: pt educated on back precautions with adls and bed mobility. pt provided pillow between knees to log roll to the Left side now that chest tube is removed. Pt tolerated well. Pt demonstrates rotating downward nystagmus with log roll to left. pt experiences nausea with log rolling. Pt and wife educated on nystagums and correlation with falls with vestibular deficits. pt currently is not safe to complete vestibular exercises for treatment. Pt position in chair with ice for edema management. Pt with large bruise on Lt hip area noted.   Cognition:   Cognition   Arousal/Alertness: Awake/alert   Orientation Level: Oriented X4   Cognition   Overall Cognitive Status: Appears within functional limits for tasks assessed/performed   Arousal/Alertness: Awake/alert   Orientation Level: Appears intact for tasks assessed   Behavior During Session: [facility] for tasks performed   Blood pressure 120/52, pulse 60, temperature 97.4 F (36.3 C), temperature source Oral, resp. rate 18, height 5\' 9"  (1.753 m), weight 74.707 kg (164 lb 11.2 oz), SpO2 98.00%.   Physical Exam  Vitals reviewed.   Constitutional: He is oriented to person, place, and time. He appears well-developed. NAD HENT: oral mucosa pink and moist.   Head: Normocephalic.   Eyes:  Pupils round and reactive to light   Neck: Neck supple. No thyromegaly present.   Cardiovascular: Normal rate and regular  rhythm.   Pulmonary/Chest: Breath sounds normal. No respiratory distress. Mild crackles left base Abdominal: Soft. Bowel sounds are hypoactive. He exhibits no distension.  Musculoskeletal:  Right Leg appropriately tender.  Neurological: He is alert and oriented to person, place, and time. No cranial nerve deficit.  UE 5/5. LLE is 3-4 prox to 4/5 distally RLE is 1-2 prox to 3-4 distally. No sensory deficits.  Skin:  Chest tube site healing with minimal drainage and pink granulation tissue. Right hip incision is clean and intact with staples and minimal drainage. Mild surrounding edema. Psychiatric: He has a normal mood and affect. His behavior is normal. Judgment and thought content normal   Results for orders placed during the hospital encounter of 09/05/12 (from the past 48 hour(s))    CBC Status: Abnormal      Collection Time      09/10/12 6:25 AM    Component   Value   Range   Comment      WBC   7.9   4.0 - 10.5 K/uL        RBC   2.95 (*)   4.22 - 5.81 MIL/uL        Hemoglobin   9.2 (*)   13.0 - 17.0 g/dL        HCT   96.0 (*)   39.0 - 52.0 %        MCV   90.5   78.0 - 100.0 fL  MCH   31.2   26.0 - 34.0 pg        MCHC   34.5   30.0 - 36.0 g/dL        RDW   16.1   09.6 - 15.5 %        Platelets   185   150 - 400 K/uL      PROTIME-INR Status: Normal      Collection Time      09/10/12 6:25 AM    Component   Value   Range   Comment      Prothrombin Time   14.2   11.6 - 15.2 seconds        INR   1.11   0.00 - 1.49      PROTIME-INR Status: Abnormal      Collection Time      09/11/12 6:13 AM    Component   Value   Range   Comment      Prothrombin Time   16.4 (*)   11.6 - 15.2 seconds        INR   1.35   0.00 - 1.49         Dg Chest Port 1 View   09/10/2012 *RADIOLOGY REPORT* Clinical Data: Trauma. Chest tube. Fall. PORTABLE CHEST - 1 VIEW Comparison: 1 day prior Findings: Prior median sternotomy. Left-sided chest tube is unchanged in position. Decrease in tiny left apical  pneumothorax, less than 5%. Normal heart size. No pleural fluid. Clear lungs. IMPRESSION: Left-sided chest tube remains in place, with decreased tiny left apical pneumothorax. Original Report Authenticated By: Jeronimo Greaves, M.D.     Post Admission Physician Evaluation:   Functional deficits secondary to Right hip dislocation, acetabular fx after fall. Patient is admitted to receive collaborative, interdisciplinary care between the physiatrist, rehab nursing staff, and therapy team. Patient's level of medical complexity and substantial therapy needs in context of that medical necessity cannot be provided at a lesser intensity of care such as a SNF. Patient has experienced substantial functional loss from his/her baseline which was documented above under the "Functional History" and "Functional Status" headings. Judging by the patient's diagnosis, physical exam, and functional history, the patient has potential for functional progress which will result in measurable gains while on inpatient rehab. These gains will be of substantial and practical use upon discharge in facilitating mobility and self-care at the household level. Physiatrist will provide 24 hour management of medical needs as well as oversight of the therapy plan/treatment and provide guidance as appropriate regarding the interaction of the two. 24 hour rehab nursing will assist with bladder management, bowel management, safety, skin/wound care, disease management, medication administration, pain management and patient education and help integrate therapy concepts, techniques,education, etc. PT will assess and treat for: .Lower extremity strength, range of motion, stamina, balance, functional mobility, safety, adaptive techniques and equipment, pain mgt, wb. Goals are: supervision to minimal assist. OT will assess and treat for: ADL's, functional mobility, safety, upper extremity strength, adaptive techniques and equipment, pain mgt, . Goals  are: supervision to minimal assist. SLP will assess and treat for: n/a. Goals are: n/a. Case Management and Social Worker will assess and treat for psychological issues and discharge planning. Team conference will be held weekly to assess progress toward goals and to determine barriers to discharge. Patient will receive at least 3 hours of therapy per day at least 5 days per week. ELOS: 7 days Prognosis: excellent Medical Problem List and Plan:  1. Right hip dislocation with acetabular fracture after fall status post ORIF 09/08/2012   2. DVT Prophylaxis/Anticoagulation: Coumadin for DVT prophylaxis. Monitor for any bleeding episodes. Venous Doppler studies 09/08/2012 negative   3. Pain Management: Hydrocodone and Robaxin as needed. Monitor with increased mobility   4. Neuropsych: This patient is capable of making decisions on his/her own behalf.   5. Lumbar 2-4 transverse process fracture with left flank hematoma. No back brace indicated.   6. Left pneumothorax. Chest tube removed 09/10/2012. No shortness of breath noted. Check oxygen saturations every shift   7. Hyperlipidemia. Lipitor 8. Constipation: augment bowel regimen    Ivory Broad, MD 09/11/2012

## 2012-09-14 ENCOUNTER — Inpatient Hospital Stay (HOSPITAL_COMMUNITY): Payer: BC Managed Care – PPO | Admitting: Occupational Therapy

## 2012-09-14 ENCOUNTER — Inpatient Hospital Stay (HOSPITAL_COMMUNITY): Payer: BC Managed Care – PPO

## 2012-09-14 DIAGNOSIS — T1490XA Injury, unspecified, initial encounter: Secondary | ICD-10-CM

## 2012-09-14 DIAGNOSIS — S32409A Unspecified fracture of unspecified acetabulum, initial encounter for closed fracture: Secondary | ICD-10-CM

## 2012-09-14 DIAGNOSIS — W138XXA Fall from, out of or through other building or structure, initial encounter: Secondary | ICD-10-CM

## 2012-09-14 LAB — CBC WITH DIFFERENTIAL/PLATELET
Basophils Absolute: 0 10*3/uL (ref 0.0–0.1)
Basophils Relative: 0 % (ref 0–1)
Eosinophils Absolute: 0.2 10*3/uL (ref 0.0–0.7)
Eosinophils Relative: 2 % (ref 0–5)
HCT: 27.4 % — ABNORMAL LOW (ref 39.0–52.0)
Hemoglobin: 9.1 g/dL — ABNORMAL LOW (ref 13.0–17.0)
Lymphocytes Relative: 14 % (ref 12–46)
Lymphs Abs: 1 10*3/uL (ref 0.7–4.0)
MCH: 30.1 pg (ref 26.0–34.0)
MCHC: 33.2 g/dL (ref 30.0–36.0)
MCV: 90.7 fL (ref 78.0–100.0)
Monocytes Absolute: 0.7 10*3/uL (ref 0.1–1.0)
Monocytes Relative: 10 % (ref 3–12)
Neutro Abs: 5.1 10*3/uL (ref 1.7–7.7)
Neutrophils Relative %: 73 % (ref 43–77)
Platelets: 358 10*3/uL (ref 150–400)
RBC: 3.02 MIL/uL — ABNORMAL LOW (ref 4.22–5.81)
RDW: 13.5 % (ref 11.5–15.5)
WBC: 7 10*3/uL (ref 4.0–10.5)

## 2012-09-14 LAB — COMPREHENSIVE METABOLIC PANEL
ALT: 42 U/L (ref 0–53)
AST: 31 U/L (ref 0–37)
Albumin: 2.5 g/dL — ABNORMAL LOW (ref 3.5–5.2)
Alkaline Phosphatase: 89 U/L (ref 39–117)
BUN: 16 mg/dL (ref 6–23)
CO2: 29 mEq/L (ref 19–32)
Calcium: 8.6 mg/dL (ref 8.4–10.5)
Chloride: 103 mEq/L (ref 96–112)
Creatinine, Ser: 0.76 mg/dL (ref 0.50–1.35)
GFR calc Af Amer: >90 mL/min (ref >90)
GFR calc non Af Amer: >90 mL/min (ref >90)
Glucose, Bld: 96 mg/dL (ref 70–99)
Potassium: 4.5 mEq/L (ref 3.5–5.1)
Sodium: 137 mEq/L (ref 135–145)
Total Bilirubin: 1.1 mg/dL (ref 0.3–1.2)
Total Protein: 5.6 g/dL — ABNORMAL LOW (ref 6.0–8.3)

## 2012-09-14 LAB — PROTIME-INR
INR: 1.99 — ABNORMAL HIGH (ref 0.00–1.49)
Prothrombin Time: 21.8 seconds — ABNORMAL HIGH (ref 11.6–15.2)

## 2012-09-14 MED ORDER — SENNOSIDES-DOCUSATE SODIUM 8.6-50 MG PO TABS
2.0000 | ORAL_TABLET | Freq: Every day | ORAL | Status: DC
  Administered 2012-09-14 – 2012-09-15 (×2): 2 via ORAL
  Filled 2012-09-14 (×2): qty 2

## 2012-09-14 MED ORDER — WARFARIN SODIUM 10 MG PO TABS
10.0000 mg | ORAL_TABLET | Freq: Once | ORAL | Status: AC
  Administered 2012-09-14: 10 mg via ORAL
  Filled 2012-09-14: qty 1

## 2012-09-14 NOTE — Plan of Care (Signed)
Problem: RH PAIN MANAGEMENT Goal: RH STG PAIN MANAGED AT OR BELOW PT'S PAIN GOAL Pain level at or below 2  Outcome: Not Progressing Pain rating above 2 at this time.

## 2012-09-14 NOTE — Plan of Care (Signed)
Problem: RH BOWEL ELIMINATION Goal: RH STG MANAGE BOWEL WITH ASSISTANCE STG Manage Bowel with mod I Assistance.  Outcome: Progressing No incontinent episode reported     

## 2012-09-14 NOTE — Progress Notes (Signed)
Occupational Therapy Session Note  Patient Details  Name: [name] MRN: [id number] Date of Birth: 07-28-1961  Today's Date: 09/14/2012 Time: 4098-1191 Time Calculation (min): 45 min  Short Term Goals: Week 1:  OT Short Term Goal 1 (Week 1): Patient will complete functional transfers with supervision OT Short Term Goal 2 (Week 1): Patient will complete bed mobility using appropriate AE & DME,  prn, with min assist to manage right LE OT Short Term Goal 3 (Week 1): Patient will complete bathing and dressing with modified independence OT Short Term Goal 4 (Week 1): Patient will demonstrate ability to complete HEP to improve endurance with supervision  Skilled Therapeutic Interventions/Progress Updates:  Patient found seated in w/c in ADL apartment after previous therapy session. Engaged in simulated tub/shower transfer on/off tub transfer bench. Without leg lifter, patient required min assist; with leg lifter patient able to perform with supervision. Patient then engaged in bed mobility on/off bed in ADL apartment. During session discussed d/c planning, hip precautions, back precautions, and overall safety using rolling walker. Towards end of session patient ambulated throughout ADL apartment and down hallway using rolling walker. Patient propelled self in w/c rest of the way to his room and wife present at end of session.   Precautions:  Precautions Precautions: Posterior Hip;Back Precaution Booklet Issued: Yes (comment) Precaution Comments: precautions explained Restrictions Weight Bearing Restrictions: Yes RLE Weight Bearing: Touchdown weight bearing Other Position/Activity Restrictions: LLE WBAT, also reviewed log rolling for protection of lumbar fxs  See FIM for current functional status  Therapy/Group: Individual Therapy  [name] 09/14/2012, 3:21 PM

## 2012-09-14 NOTE — Progress Notes (Signed)
Patient information reviewed and entered into eRehab system by Dannette Kinkaid, RN, CRRN, PPS Coordinator.  Information including medical coding and functional independence measure will be reviewed and updated through discharge.    

## 2012-09-14 NOTE — Progress Notes (Signed)
Physical Therapy Session Note  Patient Details  Name: [name] MRN: [id number] Date of Birth: 01/16/1961  Today's Date: 09/14/2012 Time: 1000-1030 Time Calculation (min): 30 min  Short Term Goals: Week 1:  PT Short Term Goal 1 (Week 1): STGs=LTGs  Skilled Therapeutic Interventions/Progress Updates:    Wife present during session and participated in family education with patient. Focused session on gait with RW (cues to slow down for safety), stair negotiation going up backwards with RW for home entry (they do not have rails at home with 4 steps) and wife able to return demonstrate safety provided min A to stabilize RW, and bed mobility in ADL apartment. Pt educated on log roll technique to protect back though pt continues to prefer to do bed mobility his way. Able to verbalize 3/3 back precautions, 3/3 hip precautions and demonstrates maintaining TDWB status with all mobility.   Therapy Documentation Precautions:  Precautions Precautions: Posterior Hip;Back Precaution Booklet Issued: Yes (comment) Precaution Comments: precautions explained Restrictions Weight Bearing Restrictions: Yes RLE Weight Bearing: Touchdown weight bearing Other Position/Activity Restrictions: LLE WBAT, also reviewed log rolling for protection of lumbar fxs  Pain: No complaints.   Locomotion : Ambulation Ambulation/Gait Assistance: 5: Supervision   See FIM for current functional status  Therapy/Group: Individual Therapy  Karolee Stamps [facility] 09/14/2012, 12:06 PM

## 2012-09-14 NOTE — Progress Notes (Signed)
Occupational Therapy Session Note  Patient Details  Name: [name] MRN: [id number] Date of Birth: Sep 08, 1961  Today's Date: 09/14/2012 Time: 6644-0347 Time Calculation (min): 45 min  Short Term Goals: Week 1:  OT Short Term Goal 1 (Week 1): Patient will complete functional transfers with supervision OT Short Term Goal 2 (Week 1): Patient will complete bed mobility using appropriate AE & DME,  prn, with min assist to manage right LE OT Short Term Goal 3 (Week 1): Patient will complete bathing and dressing with modified independence OT Short Term Goal 4 (Week 1): Patient will demonstrate ability to complete HEP to improve endurance with supervision  Skilled Therapeutic Interventions/Progress Updates:    Pt seated on toilet upon arrival and asked if he could shower this morning.  Pt stated hip and back precautions when questioned and no unsafe behaviors noted throughout therapy.  Pt ambulated to shower with RW adhering to TDWB precautions.  Pt completed all bathing and dressing tasks with only steady A when standing in shower.  Pt used AE (long handle sponge, reacher, sock aide, and long handle shoe horn) appropriately throughout session.  Focus on activity tolerance, safety awareness, and dynamic standing balance.  Therapy Documentation Precautions:  Precautions Precautions: Posterior Hip;Back Precaution Booklet Issued: Yes (comment) Precaution Comments: precautions explained Restrictions Weight Bearing Restrictions: Yes RLE Weight Bearing: Touchdown weight bearing Other Position/Activity Restrictions: LLE WBAT, also reviewed log rolling for protection of lumbar fxs Pain: Pain Assessment Pain Score:   1 Pain Type: Acute pain Pain Location: Hip Pain Orientation: Right Pain Descriptors: Aching Pain Onset: On-going Pain Intervention(s): Repositioned;RN made aware  See FIM for current functional status  Therapy/Group: Individual Therapy  Rich Brave 09/14/2012,  9:36 AM

## 2012-09-14 NOTE — Progress Notes (Addendum)
ANTICOAGULATION CONSULT NOTE - Follow up Consult  Pharmacy Consult:  Coumadin Indication: VTE prophylaxis  Allergies  Allergen Reactions  . Cefaclor     REACTION: rash    Patient Measurements: Height: 5\' 9"  (175.3 cm) IBW/kg (Calculated) : 70.7   Vital Signs: Temp: 98.3 F (36.8 C) (12/09 0548) Temp src: Oral (12/09 0548) BP: 125/65 mmHg (12/09 0548) Pulse Rate: 64  (12/09 0548)  Labs:  Basename 09/14/12 0820 09/13/12 0705 09/12/12 0625  HGB 9.1* -- --  HCT 27.4* -- --  PLT 358 -- --  APTT -- -- --  LABPROT 21.8* 19.8* 18.7*  INR 1.99* 1.75* 1.62*  HEPARINUNFRC -- -- --  CREATININE 0.76 -- --  CKTOTAL -- -- --  CKMB -- -- --  TROPONINI -- -- --    The CrCl is unknown because both a height and weight (above a minimum accepted value) are required for this calculation.      Assessment: 51 y/o male patient admitted with acetabular fracture, now s/p ORIF requiring Coumadin for VTE prophylaxis.  Patient continues on Lovenox bridge.  INR trending up.  Bloody stool noted this am likely due to constipation and hemorrhoids  Goal of Therapy:  INR 2-3 Monitor platelets by anticoagulation protocol: Yes    Plan:  - Coumadin 10mg  PO today - Continue Lovenox until INR therapeutic - Daily PT / INR    Talbert Cage, PharmD Pager:  [phone] 09/14/2012, 12:27 PM

## 2012-09-14 NOTE — Progress Notes (Signed)
Physical Therapy Session Note  Patient Details  Name: SHADRACK BRUMMITT MRN: [id number] Date of Birth: Feb 16, 1961  Today's Date: 09/14/2012 Time: 4098-1191 Time Calculation (min): 45 min  Short Term Goals: Week 1:  PT Short Term Goal 1 (Week 1): STGs=LTGs  Skilled Therapeutic Interventions/Progress Updates:   Treatment focused on simulated car transfer (practiced both from passenger's side and driver's side) using RW (supervision initially and progressed to mod I) and discussion of modifications that can be made, toileting (mod I) and introduced standing hip HEP (hip abduction, hamstring curls, and hip extension  X 10 reps x 3 sets each). Discussed DME for d/c but pt reports he will borrow most equipment through church.  Therapy Documentation Precautions:  Precautions Precautions: Posterior Hip;Back Precaution Booklet Issued: Yes (comment) Precaution Comments: precautions explained Restrictions Weight Bearing Restrictions: Yes RLE Weight Bearing: Touchdown weight bearing Other Position/Activity Restrictions: LLE WBAT, also reviewed log rolling for protection of lumbar fxs   Pain:  Reports pain is managed.    Locomotion : Ambulation Ambulation/Gait Assistance: 5: Supervision   See FIM for current functional status  Therapy/Group: Individual Therapy  Karolee Stamps [facility] 09/14/2012, 2:43 PM

## 2012-09-14 NOTE — Progress Notes (Signed)
Subjective/Complaints: Pain under reasonable control. Bowels are moving.  A 12 point review of systems has been performed and if not noted above is otherwise negative.   Objective: Vital Signs: Blood pressure 125/65, pulse 64, temperature 98.3 F (36.8 C), temperature source Oral, resp. rate 18, height 5\' 9"  (1.753 m), SpO2 100.00%. No results found. No results found for this basename: WBC:2,HGB:2,HCT:2,PLT:2 in the last 72 hours No results found for this basename: NA:2,K:2,CL:2,CO:2,GLUCOSE:2,BUN:2,CREATININE:2,CALCIUM:2 in the last 72 hours CBG (last 3)  No results found for this basename: GLUCAP:3 in the last 72 hours  Wt Readings from Last 3 Encounters:  09/06/12 74.707 kg (164 lb 11.2 oz)  09/06/12 74.707 kg (164 lb 11.2 oz)  11/11/11 74.753 kg (164 lb 12.8 oz)    Physical Exam:  Constitutional: He is oriented to person, place, and time. He appears well-developed. NAD  HENT: oral mucosa pink and moist.  Head: Normocephalic.  Eyes:  Pupils round and reactive to light  Neck: Neck supple. No thyromegaly present.  Cardiovascular: Normal rate and regular rhythm.  Pulmonary/Chest: Breath sounds normal. No respiratory distress. Mild crackles left base  Abdominal: Soft. Bowel sounds are hypoactive. He exhibits no distension.  Musculoskeletal:  Right Leg appropriately tender.  Neurological: He is alert and oriented to person, place, and time. No cranial nerve deficit.  UE 5/5. LLE is 3-4 prox to 4/5 distally RLE is 1-2 prox to 3-4 distally. No sensory deficits.  Skin:  Chest tube site healing nicely. Left hip incision with minimal serous drainage. Well=approximated  Assessment/Plan: 1. Functional deficits secondary to right hip dislocation/acetabular fx which require 3+ hours per day of interdisciplinary therapy in a comprehensive inpatient rehab setting. Physiatrist is providing close team supervision and 24 hour management of active medical problems listed below. Physiatrist  and rehab team continue to assess barriers to discharge/monitor patient progress toward functional and medical goals. FIM: FIM - Bathing Bathing Steps Patient Completed: Right Arm;Left Arm;Abdomen;Front perineal area;Buttocks;Right upper leg;Left upper leg Bathing: 4: Min-Patient completes 8-9 4f 10 parts or 75+ percent  FIM - Upper Body Dressing/Undressing Upper body dressing/undressing steps patient completed: Thread/unthread right sleeve of pullover shirt/dresss;Thread/unthread left sleeve of pullover shirt/dress;Put head through opening of pull over shirt/dress;Pull shirt over trunk Upper body dressing/undressing: 5: Set-up assist to: Obtain clothing/put away FIM - Lower Body Dressing/Undressing Lower body dressing/undressing steps patient completed: Thread/unthread left pants leg;Thread/unthread right pants leg;Pull pants up/down Lower body dressing/undressing: 4: Steadying Assist  FIM - Toileting Toileting steps completed by patient: Adjust clothing prior to toileting;Performs perineal hygiene;Adjust clothing after toileting Toileting Assistive Devices: Grab bar or rail for support Toileting: 4: Steadying assist  FIM - Diplomatic Services operational officer Devices: Grab bars;Walker Toilet Transfers: 5-To toilet/BSC: Supervision (verbal cues/safety issues);5-From toilet/BSC: Supervision (verbal cues/safety issues)  FIM - Banker Devices: Therapist, occupational: 5: Bed > Chair or W/C: Supervision (verbal cues/safety issues);5: Chair or W/C > Bed: Supervision (verbal cues/safety issues)  FIM - Locomotion: Wheelchair Distance: 150 Locomotion: Wheelchair: 5: Travels 150 ft or more: maneuvers on rugs and over door sills with supervision, cueing or coaxing FIM - Locomotion: Ambulation Locomotion: Ambulation Assistive Devices: Designer, industrial/product Ambulation/Gait Assistance: 4: Min assist Locomotion: Ambulation: 2: Travels 50 - 149 ft with  minimal assistance (Pt.>75%)  Comprehension Comprehension Mode: Auditory Comprehension: 6-Follows complex conversation/direction: With extra time/assistive device  Expression Expression Mode: Verbal Expression: 6-Expresses complex ideas: With extra time/assistive device  Social Interaction Social Interaction: 7-Interacts appropriately with others - No medications  needed.  Problem Solving Problem Solving: 6-Solves complex problems: With extra time  Memory Memory: 6-More than reasonable amt of time  Medical Problem List and Plan:  1. Right hip dislocation with acetabular fracture after fall status post ORIF 09/08/2012  2. DVT Prophylaxis/Anticoagulation: Coumadin for DVT prophylaxis.   Venous Doppler studies 09/08/2012 negative  3. Pain Management: Hydrocodone and Robaxin as needed. Monitor with increased mobility  4. Neuropsych: This patient is capable of making decisions on his/her own behalf.  5. Lumbar 2-4 transverse process fracture with left flank hematoma. No back brace indicated.  6. Left pneumothorax. Chest tube removed 09/10/2012. No shortness of breath noted. Check oxygen saturations every shift  7. Hyperlipidemia. Lipitor  8. Constipation: augment bowel regimen     LOS (Days) 3 A FACE TO FACE EVALUATION WAS PERFORMED  Remee Charley T 09/14/2012 7:38 AM

## 2012-09-14 NOTE — Progress Notes (Signed)
[facility] Individual Statement of Services  Patient Name:  [name]  Date:  09/14/2012  Welcome to the [facility].  Our goal is to provide you with an individualized program based on your diagnosis and situation, designed to meet your specific needs.  With this comprehensive rehabilitation program, you will be expected to participate in at least 3 hours of rehabilitation therapies Monday-Friday, with modified therapy programming on the weekends.  Your rehabilitation program will include the following services:  Physical Therapy (PT), Occupational Therapy (OT), 24 hour per day rehabilitation nursing, Therapeutic Recreaction (TR), Case Management (Social Worker), Rehabilitation Medicine, Nutrition Services and Pharmacy Services  Weekly team conferences will be held on Tuesdays to discuss your progress.  Your  Social Worker will talk with you frequently to get your input and to update you on team discussions.  Team conferences with you and your family in attendance may also be held.  Expected length of stay: 5-7 days  Overall anticipated outcome: modified independent  Depending on your progress and recovery, your program may change.  Your  Social Worker will coordinate services and will keep you informed of any changes.  Your  Social Worker's name and contact numbers are listed  below.  The following services may also be recommended but are not provided by the [facility]:   Driving Evaluations  Home Health Rehabiltiation Services  [facility]  Vocational Rehabilitation   Arrangements will be made to provide these services after discharge if needed.  Arrangements include referral to agencies that provide these services.  Your insurance has been verified to be:  [place] Your primary doctor is:  Dr. Larwance Sachs  Pertinent information will be shared with your doctor and your insurance company.    Social Worker:  [place], [place] [phone] or [phone]  Information discussed with and copy given to patient by: Amada Jupiter, 09/14/2012, 4:19 PM

## 2012-09-15 ENCOUNTER — Inpatient Hospital Stay (HOSPITAL_COMMUNITY): Payer: BC Managed Care – PPO

## 2012-09-15 ENCOUNTER — Inpatient Hospital Stay (HOSPITAL_COMMUNITY): Payer: BC Managed Care – PPO | Admitting: Physical Therapy

## 2012-09-15 LAB — PROTIME-INR
INR: 2.72 — ABNORMAL HIGH (ref 0.00–1.49)
Prothrombin Time: 27.5 seconds — ABNORMAL HIGH (ref 11.6–15.2)

## 2012-09-15 NOTE — Progress Notes (Addendum)
Occupational Therapy Session Note  Patient Details  Name: [name] MRN: [id number] Date of Birth: December 15, 1960  Today's Date: 09/15/2012 Time: 0800-0845 Time Calculation (min): 45 min  Short Term Goals: Week 1:  OT Short Term Goal 1 (Week 1): Patient will complete functional transfers with supervision OT Short Term Goal 2 (Week 1): Patient will complete bed mobility using appropriate AE & DME,  prn, with min assist to manage right LE OT Short Term Goal 3 (Week 1): Patient will complete bathing and dressing with modified independence OT Short Term Goal 4 (Week 1): Patient will demonstrate ability to complete HEP to improve endurance with supervision  Skilled Therapeutic Interventions/Progress Updates:    Pt engaged in bathing at shower level and dressing with sit<>stand from EOB.  Pt amb with RW to gather clothing before walking into bathroom to attempt voiding while standing at toilet before transferring to shower.  Pt completed all bathing/dressing tasks at mod I level using AE appropriately.  Focus on activity tolerance, safety awareness, and dynamic standing balance.  Pt transitioned to functional amb with RW for home mgmt tasks.  Walker Facilities manager issued for use at home.  Pt stated his wife was purchasing AE for use at home.  Pt completed all tasks at mod I level.  Therapy Documentation Precautions:  Precautions Precautions: Posterior Hip;Back Precaution Booklet Issued: Yes (comment) Precaution Comments: precautions explained Restrictions Weight Bearing Restrictions: Yes RLE Weight Bearing: Touchdown weight bearing Other Position/Activity Restrictions: LLE WBAT, also reviewed log rolling for protection of lumbar fxs  See FIM for current functional status  Therapy/Group: Individual Therapy  Rich Brave 09/15/2012, 8:46 AM

## 2012-09-15 NOTE — Progress Notes (Signed)
Physical Therapy Session Note  Patient Details  Name: [name] MRN: [id number] Date of Birth: 1961-08-05  Today's Date: 09/15/2012 Time: 1400-1500 Time Calculation (min): 60 min  Short Term Goals: Week 1:  PT Short Term Goal 1 (Week 1): STGs=LTGs  Skilled Therapeutic Interventions/Progress Updates:  Patient performed sit/stand transfers to/from WC/RW with supervision.  Ambulation with RW on tiled surface 150' with supervision x1 bout and 100' with supervision x 3 bouts with extended seated rest break between bouts.  Ambulation with RW around obstacles on tiled surface with supervision.  Ascended/descended curb height with RW and supervision with cuing for walker placement.  Patient performed standing dynamic balance activities without LOB at [facility] with supervision.  Patient required brief rest break between activities due to fatigue.  Patient performed LE strengthening in sitting:  2x10 reps LAQ, marching in place, ankle circles.      Therapy Documentation Precautions:  Precautions Precautions: Posterior Hip;Back Precaution Booklet Issued: Yes (comment) Precaution Comments: precautions explained Restrictions Weight Bearing Restrictions: Yes RLE Weight Bearing: Touchdown weight bearing Other Position/Activity Restrictions: LLE WBAT, also reviewed log rolling for protection of lumbar fxs Pain: Pain Assessment Pain Assessment: No/denies pain Locomotion : Ambulation Ambulation/Gait Assistance: 5: Supervision    See FIM for current functional status  Therapy/Group: Group Therapy  [name] A 09/15/2012, 3:37 PM

## 2012-09-15 NOTE — Patient Care Conference (Signed)
Inpatient RehabilitationTeam Conference Note Date: 09/15/2012   Time: 2:10 PM    Patient Name: [name]      Medical Record Number: [id number]  Date of Birth: 1961-06-29 Sex: Male         Room/Bed: 4011/4011-01 Payor Info: Payor: BLUE CROSS BLUE SHIELD  Plan: BCBS PPO OUT OF STATE  Product Type: *No Product type*     Admitting Diagnosis: Acet fx,fall  Admit Date/Time:  09/11/2012  2:31 PM Admission Comments: No comment available   Primary Diagnosis:  Trauma Principal Problem: Trauma  Patient Active Problem List   Diagnosis Date Noted  . Trauma 09/14/2012  . Acute blood loss anemia 09/09/2012  . prophylaxis against hetereotopic ossification with right hip radiotherapy 09/09/2012  . Fall 09/07/2012  . Asthma 09/07/2012  . Pneumothorax on left 09/06/2012  . Pneumomediastinum 09/06/2012  . Right acetabular fracture 09/06/2012  . Hip dislocation, right 09/06/2012  . Lumbar transverse process fracture 09/06/2012  . Coronary artery disease 11/11/2011  . HYPERLIPIDEMIA 09/24/2010  . OBSTRUCTIVE SLEEP APNEA 09/24/2010  . HYPERTENSION 09/24/2010  . ALLERGIC RHINITIS 09/24/2010    Expected Discharge Date: Expected Discharge Date: 09/16/12  Team Members Present: Physician: Dr. Faith Rogue Social Worker Present: Amada Jupiter, LCSW Nurse Present: Daryll Brod, RN PT Present: Karolee Stamps, PT OT Present: Mackie Pai, OT;Patricia Mat Carne, OT Other (Discipline and Name): Tora Duck, PPS Coordinator     Current Status/Progress Goal Weekly Team Focus  Medical   right acetabular fx s/p orif  pain mgt, increase functional mobility  pain mgt, constipation rx   Bowel/Bladder   Continent of bowel and bladder. LBM 09/14/12  Continent of bowel and bladder  Monitor   Swallow/Nutrition/ Hydration             ADL's   supervision/steady A overall  supervisoin/mod I overall  family educaiton; activity tolerance; safety awareness   Mobility   S gait and transfers; min A stairs   mod I gait and transfers, min A stairs  fam ed/discharge planning, functional strengthening, adhering to precautions with functional tasks   Communication             Safety/Cognition/ Behavioral Observations            Pain   Vicodin x 2 tabs q 4hrs, prn  <3  Monitor for effectiveness. Offer 1hr prior to initial therapy session    Skin   Bruising to L hip/R forearm, L hip incision with staples draining moderate amount of serous drainage, chest tube site with scab ota  No additional skin breakdown  Assess for appropriate healing    Rehab Goals Patient on target to meet rehab goals: Yes *See Interdisciplinary Assessment and Plan and progress notes for long and short-term goals  Barriers to Discharge: pain control    Possible Resolutions to Barriers:  analgesia, improved technique    Discharge Planning/Teaching Needs:  home with wife who can provide 24/7 assistance      Team Discussion:  Excellent gains and ready for d/c tomorrow! No concerns  Revisions to Treatment Plan:  None   Continued Need for Acute Rehabilitation Level of Care: The patient requires daily medical management by a physician with specialized training in physical medicine and rehabilitation for the following conditions: Daily direction of a multidisciplinary physical rehabilitation program to ensure safe treatment while eliciting the highest outcome that is of practical value to the patient.: Yes Daily medical management of patient stability for increased activity during participation in an intensive rehabilitation regime.:  Yes Daily analysis of laboratory values and/or radiology reports with any subsequent need for medication adjustment of medical intervention for : Post surgical problems;Cardiac problems;Other  Lavita Pontius 09/15/2012, 2:36 PM

## 2012-09-15 NOTE — Progress Notes (Signed)
Physical Therapy Session Note  Patient Details  Name: [name] MRN: [id number] Date of Birth: 1960/11/07  Today's Date: 09/15/2012 Time: 1530-1600 Time Calculation (min): 30 min  Short Term Goals: Week 1:  PT Short Term Goal 1 (Week 1): STGs=LTGs  Skilled Therapeutic Interventions/Progress Updates:    Focused session on family education with pt and pt's wife in regards to d/c planning, home environment set up, simulated car transfer (31" seat height per measurements pt's wife brought in), stair training, and overall gait/endurance and safety recommendations. Discouraged pt to from doing flight of stairs first few days at home due to endurance level and to get settled into home first with both in agreement. Pt preferred going up backwards using bilateral rails. Pt made modified independent in room with RW and also discussed DME needs for RW.  Therapy Documentation Precautions:  Precautions Precautions: Posterior Hip;Back Precaution Booklet Issued: Yes (comment) Precaution Comments: precautions explained Restrictions Weight Bearing Restrictions: Yes RLE Weight Bearing: Touchdown weight bearing Other Position/Activity Restrictions: LLE WBAT, also reviewed log rolling for protection of lumbar fxs  Pain: Pain Assessment Pain Assessment: No/denies pain   See FIM for current functional status  Therapy/Group: Individual Therapy  Karolee Stamps [facility] 09/15/2012, 4:19 PM

## 2012-09-15 NOTE — Progress Notes (Signed)
ANTICOAGULATION CONSULT NOTE - Follow up Consult  Pharmacy Consult:  Coumadin Indication: VTE prophylaxis  Allergies  Allergen Reactions  . Cefaclor     REACTION: rash    Patient Measurements: Height: 5\' 9"  (175.3 cm) Weight: 165 lb 5.5 oz (75 kg) IBW/kg (Calculated) : 70.7   Vital Signs: Temp: 98.1 F (36.7 C) (12/10 0501) Temp src: Oral (12/10 0501) BP: 119/75 mmHg (12/10 0501) Pulse Rate: 57  (12/10 0501)  Labs:  Basename 09/15/12 0640 09/14/12 0820 09/13/12 0705  HGB -- 9.1* --  HCT -- 27.4* --  PLT -- 358 --  APTT -- -- --  LABPROT 27.5* 21.8* 19.8*  INR 2.72* 1.99* 1.75*  HEPARINUNFRC -- -- --  CREATININE -- 0.76 --  CKTOTAL -- -- --  CKMB -- -- --  TROPONINI -- -- --    Estimated Creatinine Clearance: 106.8 ml/min (by C-G formula based on Cr of 0.76).  Assessment: 51 y/o male patient admitted with acetabular fracture, now s/p ORIF requiring Coumadin for VTE prophylaxis.  Patient continues on Lovenox bridge.  INR 2.72 (large increase past 24 hours).  Bloody stool noted yesterday and today likely due to constipation and hemorrhoids. Hgb remains low but stable.  Goal of Therapy:  INR 2-3 Monitor platelets by anticoagulation protocol: Yes    Plan:  - No coumadin today - D/c Lovenox as INR >2 - Daily PT / INR  Christoper Fabian, PharmD, BCPS Clinical pharmacist, pager [phone] 09/15/2012, 1:59 PM

## 2012-09-15 NOTE — Progress Notes (Signed)
Social Work Patient ID: [name], male   DOB: 07-13-1961, 51 y.o.   MRN: [id number]  Met with patient and wife to review team conference.  Both aware and agreeable with targeted d/c tomorrow after morning therapies.  Request that OP therapies be arranged instead of HH.  No other concerns.  Carr Shartzer

## 2012-09-15 NOTE — Progress Notes (Signed)
Physical Therapy Session Note  Patient Details  Name: CHRYSTOPHER STANGL MRN: [id number] Date of Birth: 1960-10-24  Today's Date: 09/15/2012 Time: [phone] (57 minutes)   Short Term Goals: Week 1:  PT Short Term Goal 1 (Week 1): STGs=LTGs  Skilled Therapeutic Interventions/Progress Updates:    Treatment focused on reviewing stair technique with RW (able to complete with min A to stabilize RW on steps going backwards) x 4 steps to simulate home entry, w/c propulsion on unit for endurance, bed mobility for log roll technique to R and L, and reviewed HEP programs for LE strengthening including ankle pumps, SAQ, heel slides, hip abduction in supine, standing hip abduction, standing hip extension, standing hamstring curls, and LAQ x 10 reps x 3 sets each. Therapist also passively stretched R hip into neutral/extension to decrease tightness in anterior hip capsule. Mobility in the room for self care/toileting in the needs using RW at end of session and returned back to bed to rest.   Therapy Documentation Precautions:  Precautions Precautions: Posterior Hip;Back Precaution Booklet Issued: Yes (comment) Precaution Comments: precautions explained Restrictions Weight Bearing Restrictions: Yes RLE Weight Bearing: Touchdown weight bearing Other Position/Activity Restrictions: LLE WBAT, also reviewed log rolling for protection of lumbar fxs    Pain: Pain Assessment Pain Assessment: No/denies pain Pain Score:   1  See FIM for current functional status  Therapy/Group: Individual Therapy  Karolee Stamps [facility] 09/15/2012, 9:36 AM

## 2012-09-15 NOTE — Progress Notes (Signed)
Social Work  Social Work Assessment and Plan  Patient Details  Name: [name] MRN: [id number] Date of Birth: 28-Jul-1961  Today's Date: 09/15/2012  Problem List:  Patient Active Problem List  Diagnosis  . HYPERLIPIDEMIA  . OBSTRUCTIVE SLEEP APNEA  . HYPERTENSION  . ALLERGIC RHINITIS  . Coronary artery disease  . Pneumothorax on left  . Pneumomediastinum  . Right acetabular fracture  . Hip dislocation, right  . Lumbar transverse process fracture  . Fall  . Asthma  . Acute blood loss anemia  . prophylaxis against hetereotopic ossification with right hip radiotherapy  . Trauma   Past Medical History:  Past Medical History  Diagnosis Date  . Left inguinal hernia   . Right inguinal hernia   . Hypertension   . Hyperlipidemia   . Asthma   . Allergy   . Postsurgical aortocoronary bypass status   . CAD (coronary artery disease)    Past Surgical History:  Past Surgical History  Procedure Date  . Double coronary artery bypass   . Hernia repair     left and right inguinal  . Orif acetabular fracture 09/08/2012    Procedure: OPEN REDUCTION INTERNAL FIXATION (ORIF) ACETABULAR FRACTURE;  Surgeon: Budd Palmer, MD;  Location: [facility] OR;  Service: Orthopedics;  Laterality: Right;   Social History:  reports that he has never smoked. He does not have any smokeless tobacco history on file. He reports that he does not drink alcohol or use illicit drugs.  Family / Support Systems Marital Status: Married Patient Roles: Spouse;Parent;Other (Comment) (employee) Spouse/Significant Other: wife, [name] @ [phone] or [phone] Children: daughter, [name] @ [phone] and [name] are two of five adult children - these daughters are only two living locally Anticipated Caregiver: wife Ability/Limitations of Caregiver: none Caregiver Availability: 24/7 Family Dynamics: pt describes very supportive family - no concerns  Social History Preferred language:  English Religion: Baptist Cultural Background: NA Education: college - undergad degree Read: Yes Write: Yes Employment Status: Employed 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 Legal Hisotry/Current Legal Issues: None Guardian/Conservator: None   Abuse/Neglect Physical Abuse: Denies Verbal Abuse: Denies Sexual Abuse: Denies Exploitation of patient/patient's resources: Denies Self-Neglect: Denies  Emotional Status Pt's affect, behavior adn adjustment status: Very pleasant, oriented and motivated gentleman who, while he admits to being frustrated with fall/ injury itself, he denies any significant emotional distress.   Recent Psychosocial Issues: None Pyschiatric History: None Substance Abuse History: None  Patient / Family Perceptions, Expectations & Goals Pt/Family understanding of illness & functional limitations: Pt and wife with good understanding of injury and precautions to be followed.  Good awareness of current functional limitations. Premorbid pt/family roles/activities: Very active and working full-time  - no limitations. Anticipated changes in roles/activities/participation: Pt may need limited assistance initially but would anticipate a full recovery and resumption of independent lifestyle. Pt/family expectations/goals: "Just want to be able to get back to my regular routines"  [employer]: None Premorbid Home Care/DME Agencies: None Transportation available at discharge: yes  Discharge Planning Living Arrangements: Spouse/significant other Support Systems: Spouse/significant other;Children;Other relatives;Friends/neighbors;Church/faith community Type of Residence: Private residence Insurance Resources: Media planner (specify) ([place] Financial Resources: Employment Financial Screen Referred: No Living  Expenses: Database administrator Management: Patient;Spouse Do you have any problems obtaining your medications?: No Home Management: pt and family Patient/Family Preliminary  Plans: Pt to return home with his wife to provide any assistance needed. Social Work Anticipated Follow Up Needs: HH/OP Expected length of stay: 1 week  Clinical Impression Very pleasant and motivated gentleman here after unfortunate fall and hip fx at home.  Good family support and anticipate short LOS.  No s/s of any significant emotional distress.    [name] 09/15/2012, 5:31 PM

## 2012-09-15 NOTE — Progress Notes (Signed)
Subjective/Complaints: Hard stool caused hemorrhoidal bleeding A 12 point review of systems has been performed and if not noted above is otherwise negative.   Objective: Vital Signs: Blood pressure 119/75, pulse 57, temperature 98.1 F (36.7 C), temperature source Oral, resp. rate 18, height 5\' 9"  (1.753 m), SpO2 98.00%. No results found.  Basename 09/14/12 0820  WBC 7.0  HGB 9.1*  HCT 27.4*  PLT 358    Basename 09/14/12 0820  NA 137  K 4.5  CL 103  GLUCOSE 96  BUN 16  CREATININE 0.76  CALCIUM 8.6   CBG (last 3)  No results found for this basename: GLUCAP:3 in the last 72 hours  Wt Readings from Last 3 Encounters:  09/06/12 74.707 kg (164 lb 11.2 oz)  09/06/12 74.707 kg (164 lb 11.2 oz)  11/11/11 74.753 kg (164 lb 12.8 oz)    Physical Exam:  Constitutional: He is oriented to person, place, and time. He appears well-developed. NAD  HENT: oral mucosa pink and moist.  Head: Normocephalic.  Eyes:  Pupils round and reactive to light  Neck: Neck supple. No thyromegaly present.  Cardiovascular: Normal rate and regular rhythm.  Pulmonary/Chest: Breath sounds normal. No respiratory distress. Mild crackles left base  Abdominal: Soft. Bowel sounds are hypoactive. He exhibits no distension.  Musculoskeletal:  Right Leg appropriately tender.  Neurological: He is alert and oriented to person, place, and time. No cranial nerve deficit.  UE 5/5. LLE is 3-4 prox to 4/5 distally RLE is 1-2 prox to 3-4 distally. No sensory deficits.  Skin:  Chest tube site healing nicely. Left hip incision with minimal drainage. Well approximated  Assessment/Plan: 1. Functional deficits secondary to right hip dislocation/acetabular fx which require 3+ hours per day of interdisciplinary therapy in a comprehensive inpatient rehab setting. Physiatrist is providing close team supervision and 24 hour management of active medical problems listed below. Physiatrist and rehab team continue to assess  barriers to discharge/monitor patient progress toward functional and medical goals. FIM: FIM - Bathing Bathing Steps Patient Completed: Chest;Right Arm;Left Arm;Abdomen;Front perineal area;Buttocks;Right upper leg;Left upper leg;Left lower leg (including foot);Right lower leg (including foot) Bathing: 4: Steadying assist  FIM - Upper Body Dressing/Undressing Upper body dressing/undressing steps patient completed: Thread/unthread right sleeve of pullover shirt/dresss;Thread/unthread left sleeve of pullover shirt/dress;Put head through opening of pull over shirt/dress;Pull shirt over trunk Upper body dressing/undressing: 5: Set-up assist to: Obtain clothing/put away FIM - Lower Body Dressing/Undressing Lower body dressing/undressing steps patient completed: Thread/unthread right underwear leg;Thread/unthread left underwear leg;Pull underwear up/down;Pull pants up/down;Thread/unthread left pants leg;Thread/unthread right pants leg;Don/Doff right sock;Don/Doff left sock;Don/Doff left shoe Lower body dressing/undressing: 4: Min-Patient completed 75 plus % of tasks  FIM - Toileting Toileting steps completed by patient: Adjust clothing prior to toileting;Performs perineal hygiene;Adjust clothing after toileting Toileting Assistive Devices: Grab bar or rail for support Toileting: 4: Steadying assist  FIM - Diplomatic Services operational officer Devices: Grab bars;Walker Toilet Transfers: 5-To toilet/BSC: Supervision (verbal cues/safety issues);5-From toilet/BSC: Supervision (verbal cues/safety issues)  FIM - Banker Devices: Therapist, occupational: 5: Supine > Sit: Supervision (verbal cues/safety issues);5: Sit > Supine: Supervision (verbal cues/safety issues);5: Bed > Chair or W/C: Supervision (verbal cues/safety issues);5: Chair or W/C > Bed: Supervision (verbal cues/safety issues)  FIM - Locomotion: Wheelchair Distance: 150 Locomotion:  Wheelchair: 5: Travels 50 - 149 ft, turns around, maneuvers to table, bed or toilet, negotiates 3% grade: modified independent FIM - Locomotion: Ambulation Locomotion: Ambulation Assistive Devices: Designer, industrial/product Ambulation/Gait Assistance:  5: Supervision Locomotion: Ambulation: 5: Travels 150 ft or more with supervision/safety issues  Comprehension Comprehension Mode: Auditory Comprehension: 6-Follows complex conversation/direction: With extra time/assistive device  Expression Expression Mode: Verbal Expression: 6-Expresses complex ideas: With extra time/assistive device  Social Interaction Social Interaction: 7-Interacts appropriately with others - No medications needed.  Problem Solving Problem Solving: 6-Solves complex problems: With extra time  Memory Memory: 6-More than reasonable amt of time  Medical Problem List and Plan:  1. Right hip dislocation with acetabular fracture after fall status post ORIF 09/08/2012  2. DVT Prophylaxis/Anticoagulation: Coumadin for DVT prophylaxis.   Venous Doppler studies 09/08/2012 negative  3. Pain Management: Hydrocodone and Robaxin as needed. Monitor with increased mobility  4. Neuropsych: This patient is capable of making decisions on his/her own behalf.  5. Lumbar 2-4 transverse process fracture with left flank hematoma. No back brace indicated.  6. Left pneumothorax. Chest tube removed 09/10/2012. -wound healing 7. Hyperlipidemia. Lipitor  8. Constipation: miralax and senna-s   -add fiber if needed.    LOS (Days) 4 A FACE TO FACE EVALUATION WAS PERFORMED  Adyson Vanburen T 09/15/2012 7:42 AM

## 2012-09-16 ENCOUNTER — Inpatient Hospital Stay (HOSPITAL_COMMUNITY): Payer: BC Managed Care – PPO

## 2012-09-16 ENCOUNTER — Inpatient Hospital Stay (HOSPITAL_COMMUNITY): Payer: BC Managed Care – PPO | Admitting: Occupational Therapy

## 2012-09-16 DIAGNOSIS — S32409A Unspecified fracture of unspecified acetabulum, initial encounter for closed fracture: Secondary | ICD-10-CM

## 2012-09-16 DIAGNOSIS — W138XXA Fall from, out of or through other building or structure, initial encounter: Secondary | ICD-10-CM

## 2012-09-16 LAB — PROTIME-INR
INR: 2.46 — ABNORMAL HIGH (ref 0.00–1.49)
Prothrombin Time: 25.5 seconds — ABNORMAL HIGH (ref 11.6–15.2)

## 2012-09-16 MED ORDER — WARFARIN SODIUM 5 MG PO TABS
5.0000 mg | ORAL_TABLET | Freq: Once | ORAL | Status: DC
  Filled 2012-09-16: qty 1

## 2012-09-16 MED ORDER — ASPIRIN 325 MG PO TABS: 325.0000 mg | ORAL_TABLET | Freq: Every day | ORAL | Status: DC

## 2012-09-16 MED ORDER — HYDROCODONE-ACETAMINOPHEN 10-325 MG PO TABS: 1.0000 | ORAL_TABLET | Freq: Four times a day (QID) | ORAL | Status: DC | PRN

## 2012-09-16 MED ORDER — POLYETHYLENE GLYCOL 3350 17 G PO PACK: 17.0000 g | PACK | Freq: Two times a day (BID) | ORAL | Status: DC

## 2012-09-16 MED ORDER — METHOCARBAMOL 500 MG PO TABS: 500.0000 mg | ORAL_TABLET | Freq: Four times a day (QID) | ORAL | Status: DC | PRN

## 2012-09-16 MED ORDER — WARFARIN SODIUM 5 MG PO TABS: 5.0000 mg | ORAL_TABLET | Freq: Every day | ORAL | Status: DC

## 2012-09-16 MED ORDER — WARFARIN SODIUM 5 MG PO TABS: 7.5000 mg | ORAL_TABLET | Freq: Every day | ORAL | Status: DC

## 2012-09-16 NOTE — Progress Notes (Signed)
Rx for Norco 10/325 mg #45 given

## 2012-09-16 NOTE — Progress Notes (Signed)
Physical Therapy Discharge Summary  Patient Details  Name: [name] MRN: [id number] Date of Birth: 1961/09/25  Today's Date: 09/16/2012 [phone] (45 minutes) Individual therapy; Premedicated for pain. Focus of treatment on finalizing family education with pt's wife in regards to stair negotiation (focused on bilateral rails backwards today to simulate flight of steps up to bedroom/bathroom) with pt at S level for safety, HEP for LE strengthening and educated with return demonstration by wife appropriate stretching to R hip, energy conservation, gait with RW, and overall safety with mobility. Pt at mod I level with RW; recommend S/min A for stairs due to WB status. Pt and wife verbalized understanding of all educated on and feel confident with d/c for this afternoon.  Patient has met 9 of 9 long term goals due to improved activity tolerance, improved balance, increased strength, increased range of motion, decreased pain and ability to compensate for deficits.  Patient to discharge at an ambulatory level Modified Independent with RW.  Patient's care partner is independent to provide the necessary physical and supervision assistance at discharge.  Reasons goals not met: n/a  Recommendation:  Patient will benefit from ongoing skilled PT services in outpatient setting to continue to advance safe functional mobility, address ongoing impairments in ROM, strength, balance, endurance, and minimize fall risk.  Equipment: RW  Reasons for discharge: treatment goals met and discharge from hospital  Patient/family agrees with progress made and goals achieved: Yes  PT Discharge Precautions/Restrictions Precautions Precautions: Posterior Hip;Back Restrictions Weight Bearing Restrictions: Yes RLE Weight Bearing: Touchdown weight bearing   Pain Pain Assessment Pain Assessment: No/denies pain Vision/Perception  Vision - History Baseline Vision: Bifocals Patient Visual Report: No  change from baseline Vision - Assessment Vision Assessment: Vision not tested Perception Perception: Within Functional Limits Praxis Praxis: Intact  Cognition Overall Cognitive Status: Appears within functional limits for tasks assessed Arousal/Alertness: Awake/alert Orientation Level: Oriented X4 Safety/Judgment: Appears intact Sensation Sensation Light Touch: Appears Intact Proprioception: Appears Intact Coordination Gross Motor Movements are Fluid and Coordinated: Yes Fine Motor Movements are Fluid and Coordinated: Yes Motor  Motor Motor: Within Functional Limits     Trunk/Postural Assessment  Cervical Assessment Cervical Assessment: Within Functional Limits Thoracic Assessment Thoracic Assessment: Within Functional Limits Lumbar Assessment Lumbar Assessment: Exceptions to [facility] (not tested secondary to back precautions) Postural Control Postural Control: Within Functional Limits  Balance Balance Balance Assessed: Yes Static Sitting Balance Static Sitting - Level of Assistance: 7: Independent Dynamic Sitting Balance Dynamic Sitting - Level of Assistance: 6: Modified independent (Device/Increase time) Static Standing Balance Static Standing - Level of Assistance: 6: Modified independent (Device/Increase time) Dynamic Standing Balance Dynamic Standing - Level of Assistance: 6: Modified independent (Device/Increase time) Extremity Assessment  RUE Assessment RUE Assessment: Within Functional Limits LUE Assessment LUE Assessment: Within Functional Limits RLE Strength RLE Overall Strength Comments: 3/5 grossly LLE Assessment LLE Assessment: Within Functional Limits  See FIM for current functional status  Karolee Stamps [facility] 09/16/2012, 11:28 AM

## 2012-09-16 NOTE — Progress Notes (Signed)
Occupational Therapy Session Note  Patient Details  Name: [name] MRN: [id number] Date of Birth: 08/26/1961  Today's Date: 09/16/2012 Time: 0800-0840 Time Calculation (min): 40 min  Short Term Goals: Week 1:  OT Short Term Goal 1 (Week 1): Patient will complete functional transfers with supervision OT Short Term Goal 2 (Week 1): Patient will complete bed mobility using appropriate AE & DME,  prn, with min assist to manage right LE OT Short Term Goal 3 (Week 1): Patient will complete bathing and dressing with modified independence OT Short Term Goal 4 (Week 1): Patient will demonstrate ability to complete HEP to improve endurance with supervision  Skilled Therapeutic Interventions/Progress Updates:    Pt engaged in bathing and dressing tasks within room.  Pt amb with RW in room to gather clothing and supplies before walking to shower for bathing and back to room to dress from EOB.  Wife present for therapy session.  Pt completed all tasks at mod I/Independent level.  Discussed equipment needs for shower at home.  Pt and wife stated they would check around and find something that will fit into shower.  Pt pleased with progress and excited to go home today.  Therapy Documentation Precautions:  Precautions Precautions: Posterior Hip;Back Precaution Booklet Issued: Yes (comment) Precaution Comments: precautions explained Restrictions Weight Bearing Restrictions: Yes RLE Weight Bearing: Touchdown weight bearing Other Position/Activity Restrictions: LLE WBAT, also reviewed log rolling for protection of lumbar fxs General:   Pain: Pain Assessment Pain Assessment: No/denies pain Pain Score: Asleep Pain Type: Acute pain Pain Location: Back Pain Orientation: Mid;Lower Pain Descriptors: Aching Pain Frequency: Intermittent Pain Onset: Awakened from sleep Pain Intervention(s): Medication (See eMAR)  See FIM for current functional status  Therapy/Group: Individual  Therapy  Rich Brave 09/16/2012, 8:40 AM

## 2012-09-16 NOTE — Progress Notes (Signed)
ANTICOAGULATION CONSULT NOTE - Follow up Consult  Pharmacy Consult:  Coumadin Indication: VTE prophylaxis  Allergies  Allergen Reactions  . Cefaclor     REACTION: rash    Patient Measurements: Height: 5\' 9"  (175.3 cm) Weight: 165 lb 5.5 oz (75 kg) IBW/kg (Calculated) : 70.7   Vital Signs: Temp: 98.4 F (36.9 C) (12/11 0708) Temp src: Oral (12/11 0708) BP: 125/61 mmHg (12/11 0708) Pulse Rate: 59  (12/11 0708)  Labs:  Basename 09/16/12 0520 09/15/12 0640 09/14/12 0820  HGB -- -- 9.1*  HCT -- -- 27.4*  PLT -- -- 358  APTT -- -- --  LABPROT 25.5* 27.5* 21.8*  INR 2.46* 2.72* 1.99*  HEPARINUNFRC -- -- --  CREATININE -- -- 0.76  CKTOTAL -- -- --  CKMB -- -- --  TROPONINI -- -- --    Estimated Creatinine Clearance: 106.8 ml/min (by C-G formula based on Cr of 0.76).  Assessment: 51 y/o male patient admitted with acetabular fracture, now s/p ORIF requiring Coumadin for VTE prophylaxis.  INR 2.46 after dose held yesterday.  Bloody stool noted yesterday likely due to constipation and hemorrhoids. No bleeding noted today.  Goal of Therapy:  INR 2-3 Monitor platelets by anticoagulation protocol: Yes    Plan:  - Coumadin 5mg  po today - Daily PT / INR  Christoper Fabian, PharmD, BCPS Clinical pharmacist, pager [phone] 09/16/2012, 11:20 AM

## 2012-09-16 NOTE — Progress Notes (Signed)
Occupational Therapy Session Note & Discharge Summary  Patient Details  Name: [name] MRN: [id number] Date of Birth: Nov 22, 1960  Today's Date: 09/16/2012  SESSION NOTE 1130-1155 - 25 Minutes Individual Therapy No complaints of pain Patient found supine in bed. Patient performed bed mobility and transferred into w/c at mod I level. Patient then propelled self from room -> ADL apartment for simulated walk-in shower transfer using simulated block. Patient abel to perform shower transfer at mod I level using shower chair. Patient's wife present and can help prn. Patient propelled self back to room in order to discharge -> home.   ---------------------------------------------------------------------------------------  DISCHARGE SUMMARY Patient has met 10 of 10 long term goals due to improved activity tolerance, improved balance, postural control, ability to compensate for deficits, improved attention, improved awareness and improved coordination.  Pt made excellent progress with bathing, dressing, toilet transfers and toileting, and shower transfers.  Pt is mod I for BADLs except supervision for shower transfers.  Pt's wife has been present for therapy and provides appropriate level of assistance/supervision.  Patient to discharge at overall Modified Independent level.  Patient's care partner is independent to provide the necessary supervision prn assistance at discharge.    Recommendation: No additional occupational therapy recommended at this time.  . Equipment: Shower chair  Reasons for discharge: treatment goals met and discharge from hospital  Patient/family agrees with progress made and goals achieved: Yes  Precautions/Restrictions  Restrictions Weight Bearing Restrictions: Yes RLE Weight Bearing: Touchdown weight bearing  Pain Pain Assessment Pain Assessment: No/denies pain Pain Score: Asleep Pain Type: Acute pain Pain Location: Back Pain Orientation: Mid;Lower Pain  Descriptors: Aching Pain Frequency: Intermittent Pain Onset: Awakened from sleep Pain Intervention(s): Medication (See eMAR)  ADL ADL Equipment Provided: Reacher;Sock aid;Long-handled shoe horn;Long-handled sponge Eating: Independent Grooming: Independent Upper Body Bathing: Independent Where Assessed-Upper Body Bathing: Shower Lower Body Bathing: Modified independent Where Assessed-Lower Body Bathing: Shower Where Assessed-Upper Body Dressing: Edge of bed Lower Body Dressing: Independent Where Assessed-Lower Body Dressing: Edge of bed Toileting: Modified independent Where Assessed-Toileting: Teacher, adult education: Engineer, agricultural Method: Production designer, theatre/television/film Method: Designer, industrial/product: Information systems manager with back  Vision/Perception  Vision - History Baseline Vision: Bifocals Patient Visual Report: No change from baseline Vision - Assessment Vision Assessment: Vision not tested Perception Perception: Within Functional Limits Praxis Praxis: Intact   Cognition Overall Cognitive Status: Appears within functional limits for tasks assessed Arousal/Alertness: Awake/alert Orientation Level: Oriented X4  Sensation Sensation Light Touch: Appears Intact Coordination Gross Motor Movements are Fluid and Coordinated: Yes Fine Motor Movements are Fluid and Coordinated: Yes  Motor  Motor Motor: Within Functional Limits  Trunk/Postural Assessment  Cervical Assessment Cervical Assessment: Within Functional Limits Thoracic Assessment Thoracic Assessment: Within Functional Limits Lumbar Assessment Lumbar Assessment: Exceptions to [facility] (not tested secondary to back precautions) Postural Control Postural Control: Within Functional Limits   Extremity/Trunk Assessment RUE Assessment RUE Assessment: Within Functional Limits LUE Assessment LUE Assessment: Within Functional  Limits  See FIM for current functional status  Rich Brave 09/16/2012, 8:59 AM

## 2012-09-16 NOTE — Progress Notes (Signed)
Pt d/c'd home with wife at this time. Delle Reining, PA provided discharge instructions; patient/wife verbalized understanding and all questions answered. VSS, no pain reported at this time. Patient escorted via w/c with belongings to discharge area. Mick Sell, RN

## 2012-09-16 NOTE — Progress Notes (Signed)
Social Work  Discharge Note  The overall goal for the admission was met for:   Discharge location: Yes - home with wife to provide any assistance needed  Length of Stay: Yes - 5 days  Discharge activity level: Yes - modified independent  Home/community participation: Yes  Services provided included: MD, RD, PT, OT, RN, TR, Pharmacy and SW  Financial Services: Private Insurance: [employer] [place]  Follow-up services arranged: Outpatient: PT via [facility]. location), DME: rolling walker and tub seat via [facility] and Patient/Family has no preference for HH/DME agencies  Comments (or additional information): Have notified Dorita Fray, CM for [employer] [place], of pt's d/c today  Patient/Family verbalized understanding of follow-up arrangements: Yes  Individual responsible for coordination of the follow-up plan: patient  Confirmed correct DME delivered: Litzy Dicker 09/16/2012    Annais Crafts

## 2012-09-18 ENCOUNTER — Ambulatory Visit: Payer: BC Managed Care – PPO | Attending: Physical Medicine & Rehabilitation

## 2012-09-18 DIAGNOSIS — M2550 Pain in unspecified joint: Secondary | ICD-10-CM | POA: Insufficient documentation

## 2012-09-18 DIAGNOSIS — R262 Difficulty in walking, not elsewhere classified: Secondary | ICD-10-CM | POA: Insufficient documentation

## 2012-09-18 DIAGNOSIS — IMO0001 Reserved for inherently not codable concepts without codable children: Secondary | ICD-10-CM | POA: Insufficient documentation

## 2012-09-18 DIAGNOSIS — M2560 Stiffness of unspecified joint, not elsewhere classified: Secondary | ICD-10-CM | POA: Insufficient documentation

## 2012-09-21 NOTE — Discharge Summary (Signed)
NAMESAVIOR, HIMEBAUGH NO.:  [id number]  MEDICAL RECORD NO.:  [id number]  LOCATION:  4011                         FACILITY:  [facility]  PHYSICIAN:  Ranelle Oyster, M.D.DATE OF BIRTH:  1961-03-28  DATE OF ADMISSION:  09/11/2012 DATE OF DISCHARGE:  09/16/2012                              DISCHARGE SUMMARY   DISCHARGE DIAGNOSES: 1. Right hip dislocation with acetabular fracture after a fall with     ORIF September 08, 2012. 2. Coumadin for deep vein thrombosis prophylaxis. 3. Pain management. 4. Lumbar 2-4 transverse process fracture with a flank hematoma. 5. Left pneumothorax chest tube removed. 6. Hyperlipidemia. 7. Constipation.  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  There was no report of loss of consciousness.  The patient noted immediate back pain and right hip pain.  Cranial CT scan was negative.  X-rays and imaging revealed left pneumothorax, right hip dislocation with posterior acetabular fractures as well as lumbar 2-4 left transverse process fracture and flank hematoma on the left.  A chest tube was inserted.  Underwent ORIF of right acetabular fracture September 08, 2012, per Dr. [name], advised weightbearing as tolerated with posterior hip precautions.  Placed on Coumadin for DVT prophylaxis. Venous Doppler studies lower extremities September 08, 2012 negative. Chest tube removed September 10, 2012.  The patient did receive radiation for HO prophylaxis September 09, 2012.  Therapies were ongoing and the patient was admitted for a comprehensive rehab program.  PAST MEDICAL HISTORY:  See discharge diagnoses.  SOCIAL HISTORY:  Lives with spouse, 1 level home, 14 steps to entry.  FUNCTIONAL HISTORY PRIOR TO ADMISSION:  Independent.  Working full time.  FUNCTIONAL STATUS UPON ADMISSION TO REHAB SERVICES:  Scooting to the edge of the bed 70%, 30% sit to  supine.  PHYSICAL EXAMINATION:  VITAL SIGNS:  Blood pressure 132/79, pulse 76, respirations 18, temperature 97.8. GENERAL:  This was an alert male, in no acute distress, oriented x3. LUNGS:  Clear to auscultation. CARDIAC:  Regular rate and rhythm. ABDOMEN:  Soft, nontender.  Good bowel sounds.  Chest tube site healing nicely. EXTREMITIES:  Right hip incision clean and dry with staples intact.  REHABILITATION HOSPITAL COURSE:  The patient was admitted to Inpatient Rehab Services with therapies initiated on a 3-hour daily basis consisting of physical therapy, occupational therapy, and rehabilitation nursing.  The following issues were addressed during the patient's rehabilitation stay.  Pertaining to Mr. Trombly right hip dislocation with acetabular fracture, he had undergone ORIF, touchdown weightbearing per Dr. [name] with neurovascular sensation intact.  He remained on Coumadin for DVT prophylaxis.  Venous Doppler studies September 08, 2012 negative.  Pain management with the use of hydrocodone and Robaxin. Noted lumbar L2-4 transverse process fracture with left flank hematoma with no brace identified, no bleeding episodes.  He had sustained a left pneumothorax, chest tube had been removed, wound healing nicely.  No chest pain or shortness of breath noted.  He did have some constipation issues that were resolved with laxative assistance.  The patient received weekly collaborative interdisciplinary team conferences to discuss estimated length of stay,  family teaching, and any barriers to discharge.  He was continent of bowel and bladder, required supervision to steady assist for overall activities of daily living, supervision ambulation as well as transfers, he did require minimal assist for stairs.  Full family teaching was completed and plan was to be discharged to home with ongoing therapies dictated as per [employer].  DISCHARGE MEDICATIONS:  At the time of dictation  included: 1. Aspirin 325 mg daily. 2. Lipitor 20 mg daily. 3. Zyrtec 10 mg daily. 4. Hydrocodone 1 tablet every 6 hours as needed for pain. 5. Robaxin 500 mg every 6 hours as needed. 6. Coumadin 5 mg daily to complete Coumadin protocol.  The patient was touchdown weightbearing to lower extremity.  Would follow up with Dr. Myrene Galas of Orthopedic Services.     Mariam Dollar, P.A.   ______________________________ Ranelle Oyster, M.D.    DA/MEDQ  D:  09/21/2012  T:  09/21/2012  Job:  [id number]  cc:   Kandyce Rud, MD

## 2012-09-21 NOTE — Discharge Summary (Signed)
  Discharge summary job # [id number]

## 2012-09-22 ENCOUNTER — Ambulatory Visit: Payer: BC Managed Care – PPO | Admitting: Physical Therapy

## 2012-09-24 ENCOUNTER — Ambulatory Visit: Payer: BC Managed Care – PPO | Admitting: Physical Therapy

## 2012-09-29 ENCOUNTER — Ambulatory Visit: Payer: BC Managed Care – PPO | Admitting: Physical Therapy

## 2012-10-05 ENCOUNTER — Ambulatory Visit: Payer: BC Managed Care – PPO

## 2012-10-08 ENCOUNTER — Telehealth: Payer: Self-pay | Admitting: Cardiovascular Disease

## 2012-10-08 ENCOUNTER — Ambulatory Visit: Payer: BC Managed Care – PPO | Attending: Physical Medicine & Rehabilitation

## 2012-10-08 DIAGNOSIS — R262 Difficulty in walking, not elsewhere classified: Secondary | ICD-10-CM | POA: Insufficient documentation

## 2012-10-08 DIAGNOSIS — M2550 Pain in unspecified joint: Secondary | ICD-10-CM | POA: Insufficient documentation

## 2012-10-08 DIAGNOSIS — M2560 Stiffness of unspecified joint, not elsewhere classified: Secondary | ICD-10-CM | POA: Insufficient documentation

## 2012-10-08 DIAGNOSIS — IMO0001 Reserved for inherently not codable concepts without codable children: Secondary | ICD-10-CM | POA: Insufficient documentation

## 2012-10-08 MED ORDER — ASPIRIN 81 MG PO TABS: 81.0000 mg | ORAL_TABLET | Freq: Every day | ORAL | Status: AC

## 2012-10-08 NOTE — Telephone Encounter (Signed)
Will forward to Dr Elease Hashimoto to advise.

## 2012-10-08 NOTE — Telephone Encounter (Signed)
ASA 81 mg is fine
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
## 2012-10-08 NOTE — Telephone Encounter (Signed)
Pt updated, mar changed, pt agreed to plan.

## 2012-10-12 NOTE — Progress Notes (Signed)
[facility] Radiation Oncology End of Treatment Note  Name:[name]  Date: 09/09/2012 YNW:[id number] DOB:1960/10/29   Status:inpatient    DIAGNOSIS: right hip injury - prophylaxis against heterotopic ossification    INDICATION FOR TREATMENT: Definitive   TREATMENT DATES: 09/09/2012                          SITE/DOSE:    Right hip / 7 Gray in 1 fraction              BEAMS/ENERGY:      AP PA      / 10 MV photons       NARRATIVE:          He tolerated treatment well without any complications.                  PLAN: Followup with orthopedic surgery. Patient/family instructed to call if questions or worsening complaints.  -----------------------------------  Lonie Peak, MD

## 2012-10-13 ENCOUNTER — Ambulatory Visit: Payer: BC Managed Care – PPO

## 2012-10-15 ENCOUNTER — Ambulatory Visit: Payer: BC Managed Care – PPO

## 2012-10-20 ENCOUNTER — Ambulatory Visit: Payer: BC Managed Care – PPO

## 2012-10-22 ENCOUNTER — Ambulatory Visit: Payer: BC Managed Care – PPO

## 2012-10-27 ENCOUNTER — Ambulatory Visit: Payer: BC Managed Care – PPO

## 2012-10-30 ENCOUNTER — Ambulatory Visit: Payer: BC Managed Care – PPO

## 2012-11-03 ENCOUNTER — Ambulatory Visit: Payer: BC Managed Care – PPO

## 2012-11-05 ENCOUNTER — Ambulatory Visit: Payer: BC Managed Care – PPO

## 2012-11-10 ENCOUNTER — Ambulatory Visit: Payer: BC Managed Care – PPO | Attending: Physical Medicine & Rehabilitation

## 2012-11-10 DIAGNOSIS — M2550 Pain in unspecified joint: Secondary | ICD-10-CM | POA: Insufficient documentation

## 2012-11-10 DIAGNOSIS — R262 Difficulty in walking, not elsewhere classified: Secondary | ICD-10-CM | POA: Insufficient documentation

## 2012-11-10 DIAGNOSIS — M2560 Stiffness of unspecified joint, not elsewhere classified: Secondary | ICD-10-CM | POA: Insufficient documentation

## 2012-11-10 DIAGNOSIS — IMO0001 Reserved for inherently not codable concepts without codable children: Secondary | ICD-10-CM | POA: Insufficient documentation

## 2012-11-12 ENCOUNTER — Ambulatory Visit: Payer: BC Managed Care – PPO

## 2012-11-17 ENCOUNTER — Ambulatory Visit: Payer: BC Managed Care – PPO

## 2012-11-19 ENCOUNTER — Ambulatory Visit: Payer: BC Managed Care – PPO

## 2012-12-01 ENCOUNTER — Ambulatory Visit: Payer: BC Managed Care – PPO

## 2012-12-03 ENCOUNTER — Ambulatory Visit: Payer: BC Managed Care – PPO

## 2012-12-08 ENCOUNTER — Ambulatory Visit: Payer: BC Managed Care – PPO | Attending: Physical Medicine & Rehabilitation

## 2012-12-08 DIAGNOSIS — M2560 Stiffness of unspecified joint, not elsewhere classified: Secondary | ICD-10-CM | POA: Insufficient documentation

## 2012-12-08 DIAGNOSIS — M2550 Pain in unspecified joint: Secondary | ICD-10-CM | POA: Insufficient documentation

## 2012-12-08 DIAGNOSIS — IMO0001 Reserved for inherently not codable concepts without codable children: Secondary | ICD-10-CM | POA: Insufficient documentation

## 2012-12-08 DIAGNOSIS — R262 Difficulty in walking, not elsewhere classified: Secondary | ICD-10-CM | POA: Insufficient documentation

## 2012-12-10 ENCOUNTER — Ambulatory Visit: Payer: BC Managed Care – PPO

## 2012-12-10 ENCOUNTER — Encounter: Payer: Self-pay | Admitting: Cardiovascular Disease

## 2013-04-05 ENCOUNTER — Ambulatory Visit (INDEPENDENT_AMBULATORY_CARE_PROVIDER_SITE_OTHER): Payer: BC Managed Care – PPO | Admitting: Cardiovascular Disease

## 2013-04-05 ENCOUNTER — Encounter: Payer: Self-pay | Admitting: Cardiovascular Disease

## 2013-04-05 VITALS — BP 142/92 | HR 49 | Ht 69.0 in | Wt 171.8 lb

## 2013-04-05 DIAGNOSIS — E785 Hyperlipidemia, unspecified: Secondary | ICD-10-CM

## 2013-04-05 DIAGNOSIS — I2510 Atherosclerotic heart disease of native coronary artery without angina pectoris: Secondary | ICD-10-CM

## 2013-04-05 LAB — BASIC METABOLIC PANEL
BUN: 13 mg/dL (ref 6–23)
CO2: 26 mEq/L (ref 19–32)
Calcium: 9 mg/dL (ref 8.4–10.5)
Chloride: 103 mEq/L (ref 96–112)
Creatinine, Ser: 1 mg/dL (ref 0.4–1.5)
GFR: 81.01 mL/min (ref >60.00)
Glucose, Bld: 85 mg/dL (ref 70–99)
Potassium: 3.8 mEq/L (ref 3.5–5.1)
Sodium: 138 mEq/L (ref 135–145)

## 2013-04-05 LAB — LIPID PANEL
Cholesterol: 127 mg/dL (ref 0–200)
HDL: 69.6 mg/dL (ref >39.00)
LDL Cholesterol: 45 mg/dL (ref 0–99)
Total CHOL/HDL Ratio: 2
Triglycerides: 62 mg/dL (ref 0.0–149.0)
VLDL: 12.4 mg/dL (ref 0.0–40.0)

## 2013-04-05 LAB — HEPATIC FUNCTION PANEL
ALT: 22 U/L (ref 0–53)
AST: 21 U/L (ref 0–37)
Albumin: 4.1 g/dL (ref 3.5–5.2)
Alkaline Phosphatase: 73 U/L (ref 39–117)
Bilirubin, Direct: 0.3 mg/dL (ref 0.0–0.3)
Total Bilirubin: 1.4 mg/dL — ABNORMAL HIGH (ref 0.3–1.2)
Total Protein: 6.8 g/dL (ref 6.0–8.3)

## 2013-04-05 MED ORDER — ATORVASTATIN CALCIUM 20 MG PO TABS: 20.0000 mg | ORAL_TABLET | Freq: Every day | ORAL | Status: DC

## 2013-04-05 NOTE — Progress Notes (Signed)
Marga Melnick Date of Birth  [birth year] [facility]     [place] [facility]  [address]    [address]   [address] [place], [place]  [address]    [place], [place]  [address] [phone]  Fax  [phone]  [phone]  Fax [phone]  Problem list: 1.  Coronary artery disease-status post CABG - Sept. 2008 2.  Morbid obesity-status post 140 pound weight loss 3. Hyperlipidemia 4. Hypertension  History of Present Illness:  52 year old gentleman with a history of coronary artery disease. He has a history of obesity but has lost quite a bit of weight.  He's done well since I last saw him. He's not had any episodes of chest pain or shortness of breath.  He is exercising regularly.  April 05, 2013:  Nida Boatman is doing OK.  He broke his R hip ( fell of the roof if his house)  He has recovered and is doing well.   He is back to exercising without problems.      Current Outpatient Prescriptions on File Prior to Visit  Medication Sig Dispense Refill  . acetaminophen (TYLENOL) 500 MG tablet Take 500 mg by mouth every 6 (six) hours as needed. For pain/fever      . aspirin 81 MG tablet Take 1 tablet (81 mg total) by mouth daily.      Marland Kitchen atorvastatin (LIPITOR) 20 MG tablet Take 1 tablet (20 mg total) by mouth daily.  90 tablet  3  . cetirizine (ZYRTEC) 10 MG tablet Take 10 mg by mouth daily.      . Melatonin 3 MG TABS Take 1 tablet by mouth at bedtime as needed. For sleep       No current facility-administered medications on file prior to visit.    Allergies  Allergen Reactions  . Cefaclor     REACTION: rash    Past Medical History  Diagnosis Date  . Left inguinal hernia   . Right inguinal hernia   . Hypertension   . Hyperlipidemia   . Asthma   . Allergy   . Postsurgical aortocoronary bypass status   . CAD (coronary artery disease)     Past Surgical History  Procedure Laterality Date  . Double coronary artery bypass    . Hernia repair      left and  right inguinal  . Orif acetabular fracture  09/08/2012    Procedure: OPEN REDUCTION INTERNAL FIXATION (ORIF) ACETABULAR FRACTURE;  Surgeon: Budd Palmer, MD;  Location: [facility];  Service: Orthopedics;  Laterality: Right;    History  Smoking status  . Never Smoker   Smokeless tobacco  . Not on file    History  Alcohol Use No    Family History  Problem Relation Age of Onset  . Hypertension Mother   . Hyperlipidemia Mother   . Hypertension Father   . Hyperlipidemia Father     Reviw of Systems:  Reviewed in the HPI.  All other systems are negative.  Physical Exam: Blood pressure 142/92, pulse 49, height 5\' 9"  (1.753 m), weight 171 lb 12.8 oz (77.928 kg). General: Well developed, well nourished, in no acute distress.  Head: Normocephalic, atraumatic, sclera non-icteric, mucus membranes are moist,   Neck: Supple. Negative for carotid bruits. JVD not elevated.  Lungs: Clear bilaterally to auscultation without wheezes, rales, or rhonchi. Breathing is unlabored.  Heart: RRR with S1 S2. No murmurs, rubs, or gallops appreciated.  Abdomen: Soft, non-tender, non-distended with normoactive bowel  sounds. No hepatomegaly. No rebound/guarding. No obvious abdominal masses.  Msk:  Strength and tone appear normal for age.  Extremities: No clubbing or cyanosis. No edema.  Distal pedal pulses are 2+ and equal bilaterally.  Neuro: Alert and oriented X 3. Moves all extremities spontaneously.  Psych:  Responds to questions appropriately with a normal affect.  ECG: April 05, 2013:  Sinus bradycardia at 47.  Inc. LBBB.Marland Kitchen Assessment / Plan:

## 2013-04-05 NOTE — Patient Instructions (Addendum)
Your physician recommends that you return for lab work in: today  Your physician wants you to follow-up in: 1 year  You will receive a reminder letter in the mail two months in advance. If you don't receive a letter, please call our office to schedule the follow-up appointment.   Your physician recommends that you continue on your current medications as directed. Please refer to the Current Medication list given to you today.   

## 2013-04-05 NOTE — Assessment & Plan Note (Signed)
[name] is doing well.  Will continue his current meds.  Check fasting lipids, liver enzymes, and BMP today and again in 1 year when he comes for OV.

## 2013-08-12 ENCOUNTER — Other Ambulatory Visit: Payer: Self-pay

## 2014-04-02 ENCOUNTER — Other Ambulatory Visit: Payer: Self-pay | Admitting: Cardiovascular Disease

## 2014-05-06 ENCOUNTER — Other Ambulatory Visit: Payer: Self-pay | Admitting: Cardiovascular Disease

## 2014-05-25 ENCOUNTER — Ambulatory Visit (INDEPENDENT_AMBULATORY_CARE_PROVIDER_SITE_OTHER): Payer: BC Managed Care – PPO | Admitting: *Deleted

## 2014-05-25 DIAGNOSIS — E785 Hyperlipidemia, unspecified: Secondary | ICD-10-CM

## 2014-05-25 LAB — LIPID PANEL
Cholesterol: 115 mg/dL (ref 0–200)
HDL: 65.8 mg/dL (ref >39.00)
LDL Cholesterol: 41 mg/dL (ref 0–99)
NonHDL: 49.2
Total CHOL/HDL Ratio: 2
Triglycerides: 43 mg/dL (ref 0.0–149.0)
VLDL: 8.6 mg/dL (ref 0.0–40.0)

## 2014-05-25 LAB — HEMOGLOBIN A1C: Hgb A1c MFr Bld: 5.2 % (ref 4.6–6.5)

## 2014-05-30 ENCOUNTER — Encounter: Payer: Self-pay | Admitting: Cardiovascular Disease

## 2014-05-30 ENCOUNTER — Ambulatory Visit (INDEPENDENT_AMBULATORY_CARE_PROVIDER_SITE_OTHER): Payer: BC Managed Care – PPO | Admitting: Cardiovascular Disease

## 2014-05-30 VITALS — BP 122/80 | HR 53 | Ht 69.0 in | Wt 181.8 lb

## 2014-05-30 DIAGNOSIS — R5381 Other malaise: Secondary | ICD-10-CM

## 2014-05-30 DIAGNOSIS — R5383 Other fatigue: Secondary | ICD-10-CM | POA: Insufficient documentation

## 2014-05-30 DIAGNOSIS — I2510 Atherosclerotic heart disease of native coronary artery without angina pectoris: Secondary | ICD-10-CM

## 2014-05-30 DIAGNOSIS — R5382 Chronic fatigue, unspecified: Secondary | ICD-10-CM

## 2014-05-30 DIAGNOSIS — I25810 Atherosclerosis of coronary artery bypass graft(s) without angina pectoris: Secondary | ICD-10-CM

## 2014-05-30 LAB — BASIC METABOLIC PANEL
BUN: 19 mg/dL (ref 6–23)
CO2: 27 mEq/L (ref 19–32)
Calcium: 9 mg/dL (ref 8.4–10.5)
Chloride: 104 mEq/L (ref 96–112)
Creatinine, Ser: 1.2 mg/dL (ref 0.4–1.5)
GFR: 64.39 mL/min (ref >60.00)
Glucose, Bld: 84 mg/dL (ref 70–99)
Potassium: 4.3 mEq/L (ref 3.5–5.1)
Sodium: 139 mEq/L (ref 135–145)

## 2014-05-30 LAB — HEPATIC FUNCTION PANEL
ALT: 16 U/L (ref 0–53)
AST: 18 U/L (ref 0–37)
Albumin: 3.7 g/dL (ref 3.5–5.2)
Alkaline Phosphatase: 66 U/L (ref 39–117)
Bilirubin, Direct: 0.1 mg/dL (ref 0.0–0.3)
Total Bilirubin: 0.8 mg/dL (ref 0.2–1.2)
Total Protein: 6.4 g/dL (ref 6.0–8.3)

## 2014-05-30 LAB — TSH: TSH: 1.02 u[IU]/mL (ref 0.35–4.50)

## 2014-05-30 NOTE — Assessment & Plan Note (Signed)
[name] has been having some fatigue. In addition, his heart rate is slow. Will check a TSH today.

## 2014-05-30 NOTE — Progress Notes (Signed)
[name] Date of Birth  1961/02/13 [name] [address]    [place]   [place], [place]  [address]    [place], [place]  [address] [phone]  Fax  [phone]  [phone]  Fax [phone]  Problem list: 1.  Coronary artery disease-status post CABG - Sept. 2008 2.  Morbid obesity-status post 140 pound weight loss 3. Hyperlipidemia 4. Hypertension  History of Present Illness:  53 year old gentleman with a history of coronary artery disease. He has a history of obesity but has lost quite a bit of weight.  He's done well since I last saw him. He's not had any episodes of chest pain or shortness of breath.  He is exercising regularly.  April 05, 2013:  [name] is doing OK.  He broke his R hip ( fell of the roof if his house)  He has recovered and is doing well.   He is back to exercising without problems.    May 30, 2014:  Doing well.    Current Outpatient Prescriptions on File Prior to Visit  Medication Sig Dispense Refill  . acetaminophen (TYLENOL) 500 MG tablet Take 500 mg by mouth every 6 (six) hours as needed. For pain/fever      . aspirin 81 MG tablet Take 1 tablet (81 mg total) by mouth daily.      Marland Kitchen atorvastatin (LIPITOR) 20 MG tablet TAKE ONE TABLET BY MOUTH ONE TIME DAILY   30 tablet  0  . cetirizine (ZYRTEC) 10 MG tablet Take 10 mg by mouth daily.      . Melatonin 3 MG TABS Take 1 tablet by mouth at bedtime as needed. For sleep       No current facility-administered medications on file prior to visit.    Allergies  Allergen Reactions  . Cefaclor Rash    Past Medical History  Diagnosis Date  . Left inguinal hernia   . Right inguinal hernia   . Hypertension   . Hyperlipidemia   . Asthma   . Allergy   . Postsurgical aortocoronary bypass status   . CAD (coronary artery disease)     Past Surgical History  Procedure Laterality Date  . Double coronary artery bypass    . Hernia repair      left and right inguinal  . Orif acetabular fracture  09/08/2012    Procedure: OPEN REDUCTION INTERNAL FIXATION (ORIF) ACETABULAR FRACTURE;  Surgeon: Rozanna Box, MD;  Location: [facility];  Service: Orthopedics;  Laterality: Right;    History  Smoking status  . Never Smoker   Smokeless tobacco  . Not on file    History  Alcohol Use No    Family History  Problem Relation Age of Onset  . Hypertension Mother   . Hyperlipidemia Mother   . Hypertension Father   . Hyperlipidemia Father     Reviw of Systems:  Reviewed in the HPI.  All other systems are negative.  Physical Exam: Blood pressure 122/80, pulse 53, height 5\' 9"  (1.753 m), weight 181 lb 12.8 oz (82.464 kg). General: Well developed, well nourished, in no acute distress.  Head: Normocephalic, atraumatic, sclera non-icteric, mucus membranes are moist,   Neck: Supple. Negative for carotid bruits. JVD not elevated.  Lungs: Clear bilaterally to auscultation without wheezes, rales, or rhonchi. Breathing is unlabored.  Heart: RRR with S1 S2. No murmurs, rubs, or gallops appreciated.  Abdomen: Soft, non-tender, non-distended with normoactive  bowel sounds. No hepatomegaly. No rebound/guarding. No obvious abdominal masses.  Msk:  Strength and tone appear normal for age.  Extremities: No clubbing or cyanosis. No edema.  Distal pedal pulses are 2+ and equal bilaterally.  Neuro: Alert and oriented X 3. Moves all extremities spontaneously.  Psych:  Responds to questions appropriately with a normal affect.  ECG: May 30, 2014:  Sinus brady, Inc. LBBB  Assessment / Plan:

## 2014-05-30 NOTE — Patient Instructions (Signed)
Your physician recommends that you continue on your current medications as directed. Please refer to the Current Medication list given to you today.  Your physician recommends that you have lab work today:  Thyroid, Basic Metabolic Panel, Liver Enzymes  Your physician wants you to follow-up in: 1 year with Dr. Acie Fredrickson.  You will receive a reminder letter in the mail two months in advance. If you don't receive a letter, please call our office to schedule the follow-up appointment.

## 2014-05-30 NOTE — Assessment & Plan Note (Signed)
Has a history of coronary artery bypass grafting several years ago. He's lost quite a weight since that time. His lipid levels are well controlled. We will draw basic metabolic profile and liver enzymes today since they were not drawn the other day.  I will see him  again in one year.

## 2014-05-31 ENCOUNTER — Telehealth: Payer: Self-pay | Admitting: Cardiovascular Disease

## 2014-05-31 MED ORDER — ATORVASTATIN CALCIUM 20 MG PO TABS: 20.0000 mg | ORAL_TABLET | Freq: Every day | ORAL | Status: DC

## 2014-05-31 NOTE — Telephone Encounter (Signed)
Reviewed lab results with patient who verbalized understanding.  Atorvastatin refill sent per patient request.

## 2014-05-31 NOTE — Telephone Encounter (Signed)
°  Patient is returning your call. Please call before 10am or after 12pm.

## 2015-04-05 ENCOUNTER — Encounter: Payer: Self-pay | Admitting: Dietician

## 2015-04-05 ENCOUNTER — Encounter: Payer: BLUE CROSS/BLUE SHIELD | Attending: Family Medicine | Admitting: Dietician

## 2015-04-05 VITALS — Ht 69.0 in | Wt 189.2 lb

## 2015-04-05 DIAGNOSIS — R635 Abnormal weight gain: Secondary | ICD-10-CM | POA: Diagnosis not present

## 2015-04-05 DIAGNOSIS — Z713 Dietary counseling and surveillance: Secondary | ICD-10-CM | POA: Insufficient documentation

## 2015-04-05 NOTE — Progress Notes (Signed)
  Medical Nutrition Therapy:  Appt start time: 0725 end time:  0820.   Assessment:  Primary concerns today: [name] is here today since he has a hx of heart disease and lost about 150 lbs in 2008 after surgery (from 300 lbs down to 145 lbs). Lost weight by restricting calories and increasing calories in about 6 months. Has had nutritional counseling before. I concerned that he is regaining weight (about 40 lbs) and would like to stop that. Used to run but broke his hip in 2013 so he can no longer run. Would like to get weight down to 160-170 lbs.   Used to cook more at home when he was losing weight before. Has cut out salad dressing recently to try to lose weight. Does not want to go back to the major cuts he did before.   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 Eats out almost 100% of the time since his wife does not want any food cooked at home. Doesn't usually miss or skip meals. Has been keeping a food journal and tracks daily weights. Usually not too hungry.   TANITA  BODY COMP RESULTS  04/05/15   BMI (kg/m^2) 27.7   Fat Mass (lbs) 48.5   Fat Free Mass (lbs) 139.0   Total Body Water (lbs) 101.5  Body Fat % 25.8    Preferred Learning Style:   No preference indicated   Learning Readiness:   Ready  MEDICATIONS: see list   DIETARY INTAKE:  Usual eating pattern includes 3 meals and 0-2 snacks per day.  Avoided foods include milk, cucumbers, tomatoes, coconut, squash    24-hr recall:  B ( AM):yogurt with Cap'n Crunch cereal with Diet Coke Snk ( AM): none  L ( PM): salad with no dressing with protein (ham, [place], cheese) Snk ( PM): sometimes might have M&M's  D ( PM): salad with protein, wrap or sandwich (from restaurant) or cafeteria 2 vegetables and meat ([place] and dressing)  Snk ( PM): might have ice cream (less than 1 cup) Beverages: Diet Coke, water, unsweet tea rarely  Usual physical activity: gym 2-3 x week hip and knee strength exercises and  walks/run for 1 mile, yard work on weekend  Estimated energy needs: 2000 calories 225 g carbohydrates 150 g protein 56 g fat  Progress Towards Goal(s):  In progress.   Nutritional Diagnosis:  -3.4 Unintentional weight gain As related to excess consumption of high sugar foods.  As evidenced by gradual weight gain of 40 lbs recently..    Intervention:  Nutrition counseling provided. Plan: Look for cereal with single digits (sugar) per serving and at least 5 g of fiber per serving. Check out Kashi cereals. Overall fiber recommendation is 36 g per day. Protein recommendation is 120-150 g per day. If you are hungry, have fruit with protein (nuts, peanut butter, or cheese). Limit extra sugar and fat (desserts) most of the time.  Continue to exercise and increase when you can. Think about walking at home.   Teaching Method Utilized:  Visual Auditory Hands on  Handouts given during visit include:  Bariatric Fast Food Guide   Barriers to learning/adherence to lifestyle change: wife does not want any meals cooked at home  Demonstrated degree of understanding via:  Teach Back   Monitoring/Evaluation:  Dietary intake, exercise, and body weight in 6 month(s).

## 2015-04-05 NOTE — Patient Instructions (Addendum)
Look for cereal with single digits (sugar) per serving and at least 5 g of fiber per serving. Check out Kashi cereals. Overall fiber recommendation is 36 g per day. Protein recommendation is 120-150 g per day. If you are hungry, have fruit with protein (nuts, peanut butter, or cheese). Limit extra sugar and fat (desserts) most of the time.  Continue to exercise and increase when you can. Think about walking at home.

## 2015-06-14 ENCOUNTER — Other Ambulatory Visit: Payer: Self-pay | Admitting: Cardiovascular Disease

## 2015-07-10 ENCOUNTER — Telehealth: Payer: Self-pay | Admitting: Cardiovascular Disease

## 2015-07-10 DIAGNOSIS — E785 Hyperlipidemia, unspecified: Secondary | ICD-10-CM

## 2015-07-10 NOTE — Telephone Encounter (Signed)
New Message   Pt request a call back to determine if labs are needed.

## 2015-07-10 NOTE — Telephone Encounter (Signed)
Left message that yearly fasting lab work is needed unless being done by PCP.  I advised patient may come in for lab work prior to appointment on Thursday or may complete it same day as visit.  I advised that he will need to fast for 6 hours prior to lab and to call me at the office to let me know how he would like to proceed.

## 2015-07-11 ENCOUNTER — Other Ambulatory Visit (INDEPENDENT_AMBULATORY_CARE_PROVIDER_SITE_OTHER): Payer: BLUE CROSS/BLUE SHIELD | Admitting: *Deleted

## 2015-07-11 DIAGNOSIS — E785 Hyperlipidemia, unspecified: Secondary | ICD-10-CM | POA: Diagnosis not present

## 2015-07-11 LAB — BASIC METABOLIC PANEL
BUN: 16 mg/dL (ref 7–25)
CO2: 25 mmol/L (ref 20–31)
Calcium: 9.1 mg/dL (ref 8.6–10.3)
Chloride: 103 mmol/L (ref 98–110)
Creat: 1.12 mg/dL (ref 0.70–1.33)
Glucose, Bld: 78 mg/dL (ref 65–99)
Potassium: 3.6 mmol/L (ref 3.5–5.3)
Sodium: 140 mmol/L (ref 135–146)

## 2015-07-11 LAB — LIPID PANEL
Cholesterol: 114 mg/dL — ABNORMAL LOW (ref 125–200)
HDL: 58 mg/dL (ref >=40)
LDL Cholesterol: 45 mg/dL (ref <130)
Total CHOL/HDL Ratio: 2 Ratio (ref <=5.0)
Triglycerides: 54 mg/dL (ref <150)
VLDL: 11 mg/dL (ref <30)

## 2015-07-11 LAB — HEPATIC FUNCTION PANEL
ALT: 17 U/L (ref 9–46)
AST: 19 U/L (ref 10–35)
Albumin: 4 g/dL (ref 3.6–5.1)
Alkaline Phosphatase: 67 U/L (ref 40–115)
Bilirubin, Direct: 0.2 mg/dL (ref <=0.2)
Indirect Bilirubin: 0.6 mg/dL (ref 0.2–1.2)
Total Bilirubin: 0.8 mg/dL (ref 0.2–1.2)
Total Protein: 6.7 g/dL (ref 6.1–8.1)

## 2015-07-11 NOTE — Addendum Note (Signed)
Addended by: Domenica Reamer R on: 07/11/2015 10:17 AM   Modules accepted: Orders

## 2015-07-11 NOTE — Addendum Note (Signed)
Addended by: Domenica Reamer R on: 07/11/2015 08:35 AM   Modules accepted: Orders

## 2015-07-13 ENCOUNTER — Encounter: Payer: Self-pay | Admitting: Cardiovascular Disease

## 2015-07-13 ENCOUNTER — Ambulatory Visit (INDEPENDENT_AMBULATORY_CARE_PROVIDER_SITE_OTHER): Payer: BLUE CROSS/BLUE SHIELD | Admitting: Cardiovascular Disease

## 2015-07-13 VITALS — BP 134/72 | HR 50 | Ht 69.0 in | Wt 192.8 lb

## 2015-07-13 DIAGNOSIS — E785 Hyperlipidemia, unspecified: Secondary | ICD-10-CM | POA: Diagnosis not present

## 2015-07-13 DIAGNOSIS — I2510 Atherosclerotic heart disease of native coronary artery without angina pectoris: Secondary | ICD-10-CM

## 2015-07-13 MED ORDER — ATORVASTATIN CALCIUM 20 MG PO TABS: 20.0000 mg | ORAL_TABLET | Freq: Every day | ORAL | Status: DC

## 2015-07-13 NOTE — Patient Instructions (Signed)
Medication Instructions:  Your physician recommends that you continue on your current medications as directed. Please refer to the Current Medication list given to you today.   Labwork: Your physician recommends that you return for lab work in: 1 year on the day of or a few days before your office visit with Dr. Nahser.  You will need to FAST for this appointment - nothing to eat or drink after midnight the night before except water.    Testing/Procedures: None Ordered   Follow-Up: Your physician wants you to follow-up in: 1 year with Dr. Nahser.  You will receive a reminder letter in the mail two months in advance. If you don't receive a letter, please call our office to schedule the follow-up appointment.    

## 2015-07-13 NOTE — Progress Notes (Signed)
[name] Date of Birth  1961/06/25 [place] [address]    [place]   [place], [place]  [address]    [place], [place]  [address] [phone]  Fax  [phone]  [phone]  Fax [phone]  Problem list: 1.  Coronary artery disease-status post CABG - Sept. 2008 2.  Morbid obesity-status post 140 pound weight loss 3. Hyperlipidemia 4. Hypertension  History of Present Illness:  54 year old gentleman with a history of coronary artery disease. He has a history of obesity but has lost quite a bit of weight.  He's done well since I last saw him. He's not had any episodes of chest pain or shortness of breath.  He is exercising regularly.  April 05, 2013:  [name] is doing OK.  He broke his R hip ( fell of the roof if his house)  He has recovered and is doing well.   He is back to exercising without problems.    May 30, 2014:  Doing well.    Oct. 6, 2016:  Doing well . Exercising regularly .  Lipids look great    Current Outpatient Prescriptions on File Prior to Visit  Medication Sig Dispense Refill  . acetaminophen (TYLENOL) 500 MG tablet Take 500 mg by mouth every 6 (six) hours as needed. For pain/fever    . aspirin 81 MG tablet Take 1 tablet (81 mg total) by mouth daily.    Marland Kitchen atorvastatin (LIPITOR) 20 MG tablet TAKE ONE TABLET BY MOUTH DAILY AT 6:00 PM 90 tablet 0  . cetirizine (ZYRTEC) 10 MG tablet Take 10 mg by mouth daily.    . Melatonin 3 MG TABS Take 1 tablet by mouth at bedtime as needed. For sleep     No current facility-administered medications on file prior to visit.    Allergies  Allergen Reactions  . Cefaclor Rash    Past Medical History  Diagnosis Date  . Left inguinal hernia   . Right inguinal hernia   . Hypertension   . Hyperlipidemia   . Asthma   . Allergy   . Postsurgical aortocoronary bypass status   . CAD (coronary artery disease)   . Sleep apnea     Past Surgical History    Procedure Laterality Date  . Double coronary artery bypass    . Hernia repair      left and right inguinal  . Orif acetabular fracture  09/08/2012    Procedure: OPEN REDUCTION INTERNAL FIXATION (ORIF) ACETABULAR FRACTURE;  Surgeon: Rozanna Box, MD;  Location: [facility];  Service: Orthopedics;  Laterality: Right;  . Fracture surgery      History  Smoking status  . Never Smoker   Smokeless tobacco  . Not on file    History  Alcohol Use No    Family History  Problem Relation Age of Onset  . Hypertension Mother   . Hyperlipidemia Mother   . Hypertension Father   . Hyperlipidemia Father     Reviw of Systems:  Reviewed in the HPI.  All other systems are negative.  Physical Exam: Blood pressure 134/72, pulse 50, height 5\' 9"  (1.753 m), weight 87.454 kg (192 lb 12.8 oz). General: Well developed, well nourished, in no acute distress.  Head: Normocephalic, atraumatic, sclera non-icteric, mucus membranes are moist,   Neck: Supple. Negative for carotid bruits. JVD not elevated.  Lungs: Clear bilaterally to auscultation without wheezes, rales, or rhonchi. Breathing is  unlabored.  Heart: RRR with S1 S2. No murmurs, rubs, or gallops appreciated.  Abdomen: Soft, non-tender, non-distended with normoactive bowel sounds. No hepatomegaly. No rebound/guarding. No obvious abdominal masses.  Msk:  Strength and tone appear normal for age.  Extremities: No clubbing or cyanosis. No edema.  Distal pedal pulses are 2+ and equal bilaterally.  Neuro: Alert and oriented X 3. Moves all extremities spontaneously.  Psych:  Responds to questions appropriately with a normal affect.  ECG: Oct. 6, 2016:  Sinus brady at 50 , no ST or T wave changes  Assessment / Plan:   1.  Coronary artery disease-status post CABG - Sept. 2008 - he's doing well. He's not had any episodes of angina. Continue current medications.  2.  Morbid obesity-status post 140 pound weight loss.    3. Hyperlipidemia-   continue current dose of atorvastatin. His lipids are well controlled.  4. Hypertension- BP is well controlled .    Nahser, Wonda Cheng, MD  07/13/2015 4:32 PM    [facility] [place],  [place], [place]  [address] Pager [phone] Phone: [phone]; Fax: [phone]   [facility]  [address] [place], [place]  [address] [phone]   Fax [phone]

## 2015-08-16 ENCOUNTER — Telehealth: Payer: Self-pay | Admitting: Cardiovascular Disease

## 2015-08-16 NOTE — Telephone Encounter (Signed)
New problem    Pt hurt his ankle and he was given Diclofenac Sodium 75mg  to take and he want to know if it's ok for him to take it.

## 2015-08-16 NOTE — Telephone Encounter (Signed)
Spoke with patient who states he has been prescribed diclofenac sodium 75 mg for an ankle injury.  He has not started the medication yet as he wanted to check with Dr. Acie Fredrickson to see if it was safe to start.  I advised him that it is reasonable for short term use and that he should try to continue aspirin therapy during this time.  I advised him that he should take enteric coated aspirin to prevent GI upset.  He is not on any other blood thinning agents.  I advised him to call back if he has any other questions or concerns. He verbalized understanding and agreement.

## 2015-10-05 ENCOUNTER — Encounter: Payer: BLUE CROSS/BLUE SHIELD | Attending: Family Medicine | Admitting: *Deleted

## 2015-10-05 ENCOUNTER — Encounter: Payer: Self-pay | Admitting: *Deleted

## 2015-10-05 DIAGNOSIS — Z713 Dietary counseling and surveillance: Secondary | ICD-10-CM | POA: Diagnosis not present

## 2015-10-05 DIAGNOSIS — E639 Nutritional deficiency, unspecified: Secondary | ICD-10-CM | POA: Insufficient documentation

## 2015-10-05 NOTE — Progress Notes (Signed)
  Medical Nutrition Therapy:  Appt start time: 0900 end time:  0930.   Assessment:  Primary concerns today: [name] is here for follow up nutrition counseling pertaining to desire to lose weight.  He was seen by another RD 6 months ago for this issue.  He has been tracking his weight for several years.  He lost a significant amount of weight awhile ago through calorie restriction and that weight has been very slowly creeping back up and he is frustrated.  He counts calories very closely and feels he is eating 63 extra calories/day.  He was advised to cut back on sugars and he tried to cut all his sugars out from beverages, cereal, etc. Has tried to increase his salads.  Read Wheat Belly Diet and wants to know if he should cut out wheat?  He is physically active 3 days/week, cardio and no strength training.  He calculated he needs 1800-2000 kcal/day.  Dietary recall reveals 1200-1300 kcal/day and very limited carbohydrate.  He deliberately limits carbohydrates.  He doesn't like much fruit and feels he should not eat starches. Wife doesn't cook much and they do eat out often.  This bothers him.  He also travels frequently and is not able to "follow his diet" when he travels as well and that bothers him.    Preferred Learning Style:   No preference indicated   Learning Readiness:   Change in progress   MEDICATIONS: see list  TANITA  BODY COMP RESULTS  04/05/15 10/05/15   BMI (kg/m^2) 27.7 27.8   Fat Mass (lbs) 48.5 51.5   Fat Free Mass (lbs) 139 137   Total Body Water (lbs) 101.5 100.5  % Body Fat 25.8 27.4      DIETARY INTAKE:  Usual eating pattern includes 3 meals and 1 snacks per day.  Everyday foods include proteins and vegetables.  Avoided foods include carbs.    24-hr recall:  B ( AM): low fat low calorie yogurt with pecan (30 g)    280 kcal Snk ( AM): none  L ( PM): salad with cheese, ham, [place], 1000 island dressing  400 kcal Snk ( PM): none D ( PM): brunswick stew (350) Snk  ( PM): cookie (200) Beverages: water, diet coke  Usual physical activity: gym 3 days/week, run 1 mi and stretches  Estimated energy needs: BMR 1800 calories x activity factor (1.375) = 2475 kcal/day 300 g carbohydrates 120 g protein 80 g fat   Nutritional Diagnosis:  NI-5.8.1 Inadequate carbohydrate intake As related to restricted diet.  As evidenced by dietary recall.    Intervention:  Nutrition counseling provided.  Advised inadequate calorie intake decreased metabolic rate and increases fat storage.  Advised carbohydrates are body's primary fuel source and restriction causes vitamin deficiencies. Recommended increase whole grains, in accordance to MyPlate recommendations to provide for balance. [name] states more than once he does not believe this provider's recommendations. Also advised resistance exercises to boost lean body mass.  Brad did not disagree with this recommendation   Teaching Method Utilized: Visual Auditory   Barriers to learning/adherence to lifestyle change: disbelief of nutrition education  Demonstrated degree of understanding via:  Teach Back   Monitoring/Evaluation:  Dietary intake, exercise, and body weight in 6 month(s).

## 2016-04-04 ENCOUNTER — Encounter: Payer: Self-pay | Admitting: Dietician

## 2016-04-04 ENCOUNTER — Encounter: Payer: 59 | Attending: Cardiovascular Disease | Admitting: Dietician

## 2016-04-04 VITALS — Ht 69.0 in | Wt 195.2 lb

## 2016-04-04 DIAGNOSIS — Z713 Dietary counseling and surveillance: Secondary | ICD-10-CM | POA: Insufficient documentation

## 2016-04-04 DIAGNOSIS — R635 Abnormal weight gain: Secondary | ICD-10-CM | POA: Diagnosis not present

## 2016-04-04 NOTE — Progress Notes (Signed)
  Medical Nutrition Therapy:  Appt start time: 0755 end time: 830   Assessment:  Primary concerns today: Rhylen is here today since he has a hx of heart disease and lost about 150 lbs in 2008 after surgery (from 300 lbs down to 145 lbs). Concerned that he has been regaining weight. Returns for a follow up today and has noticed that weight regain has slowed down since last visit. Has been cutting back on bread with meals, using nuts as breakfast instead of cereal, and thinks caloric intake is more consistent. Calories are 1800-2100 now. Has extensive data on food intake and calories expended. Works as an Chief Financial Officer.  Wife still refuses to eat dinner meals at home and he feels like he can't fight that battle with her. Has a hx of injuries to back and knees which have prevented efforts to exercise as hard as he would like to.   TANITA  BODY COMP RESULTS  04/05/15   BMI (kg/m^2) 27.7   Fat Mass (lbs) 48.5   Fat Free Mass (lbs) 139.0   Total Body Water (lbs) 101.5  Body Fat % 25.8    Preferred Learning Style:   No preference indicated   Learning Readiness:   Ready  MEDICATIONS: see list   DIETARY INTAKE:  Usual eating pattern includes 3 meals and 0-2 snacks per day.  Avoided foods include milk, cucumbers, tomatoes, coconut, squash    24-hr recall:  B ( AM):yogurt with pecans Snk ( AM): none  L ( PM): salad with small amount of dressing with protein (ham, chicken, [place], cheese) or chicken and vegetables Snk ( PM): sometimes a cookie D ( PM): salad with protein, wrap or sandwich (from restaurant) or cafeteria 2 vegetables and meat ([place] and dressing)  Snk ( PM): cookie or might have ice cream (less than 1 cup) Beverages: Diet Coke, water, unsweet tea rarely  Usual physical activity: gym 2 x week hip and knee strength exercises and walks/run for 1 mile, yard work on weekend  Estimated energy needs: 2000 calories 225 g carbohydrates 150 g protein 56 g fat  Progress Towards  Goal(s):  In progress.   Nutritional Diagnosis:  -3.4 Unintentional weight gain As related to excess consumption of high sugar foods.  As evidenced by gradual weight gain of 40 lbs recently..    Intervention:  Nutrition counseling provided. Plan: Talk to your wife about removing sugary food from your home. Think about having fruit as a carb at meal times (instead of bread). Consider drinking only water or unsweet tea with no sweetener instead of Diet Coke.  Look into getting a Physiological scientist.  Teaching Method Utilized:  Visual Auditory Hands on   Barriers to learning/adherence to lifestyle change: wife does not want any meals cooked at home  Demonstrated degree of understanding via:  Teach Back   Monitoring/Evaluation:  Dietary intake, exercise, and body weight in 6 month(s).

## 2016-04-04 NOTE — Patient Instructions (Addendum)
Talk to your wife about removing sugary food from your home. Think about having fruit as a carb at meal times (instead of bread). Consider drinking only water or unsweet tea with no sweetener instead of Diet Coke.  Look into getting a Physiological scientist.

## 2016-04-23 ENCOUNTER — Other Ambulatory Visit: Payer: Self-pay | Admitting: Surgery

## 2016-06-27 ENCOUNTER — Encounter: Payer: Self-pay | Admitting: Cardiovascular Disease

## 2016-07-08 ENCOUNTER — Other Ambulatory Visit: Payer: 59 | Admitting: *Deleted

## 2016-07-08 DIAGNOSIS — I10 Essential (primary) hypertension: Secondary | ICD-10-CM

## 2016-07-08 DIAGNOSIS — I2510 Atherosclerotic heart disease of native coronary artery without angina pectoris: Secondary | ICD-10-CM

## 2016-07-08 DIAGNOSIS — E7800 Pure hypercholesterolemia, unspecified: Secondary | ICD-10-CM

## 2016-07-08 DIAGNOSIS — E785 Hyperlipidemia, unspecified: Secondary | ICD-10-CM

## 2016-07-08 LAB — BASIC METABOLIC PANEL
BUN: 13 mg/dL (ref 7–25)
CO2: 29 mmol/L (ref 20–31)
Calcium: 8.7 mg/dL (ref 8.6–10.3)
Chloride: 107 mmol/L (ref 98–110)
Creat: 1.14 mg/dL (ref 0.70–1.33)
Glucose, Bld: 86 mg/dL (ref 65–99)
Potassium: 4 mmol/L (ref 3.5–5.3)
Sodium: 141 mmol/L (ref 135–146)

## 2016-07-08 LAB — LIPID PANEL
Cholesterol: 112 mg/dL — ABNORMAL LOW (ref 125–200)
HDL: 61 mg/dL (ref >=40)
LDL Cholesterol: 39 mg/dL (ref <130)
Total CHOL/HDL Ratio: 1.8 Ratio (ref <=5.0)
Triglycerides: 59 mg/dL (ref <150)
VLDL: 12 mg/dL (ref <30)

## 2016-07-08 LAB — HEPATIC FUNCTION PANEL
ALT: 15 U/L (ref 9–46)
AST: 15 U/L (ref 10–35)
Albumin: 3.6 g/dL (ref 3.6–5.1)
Alkaline Phosphatase: 71 U/L (ref 40–115)
Bilirubin, Direct: 0.3 mg/dL — ABNORMAL HIGH (ref <=0.2)
Indirect Bilirubin: 0.8 mg/dL (ref 0.2–1.2)
Total Bilirubin: 1.1 mg/dL (ref 0.2–1.2)
Total Protein: 5.8 g/dL — ABNORMAL LOW (ref 6.1–8.1)

## 2016-07-12 ENCOUNTER — Encounter: Payer: Self-pay | Admitting: Cardiovascular Disease

## 2016-07-12 ENCOUNTER — Ambulatory Visit (INDEPENDENT_AMBULATORY_CARE_PROVIDER_SITE_OTHER): Payer: 59 | Admitting: Cardiovascular Disease

## 2016-07-12 VITALS — BP 150/88 | HR 50 | Ht 69.0 in | Wt 201.6 lb

## 2016-07-12 DIAGNOSIS — I10 Essential (primary) hypertension: Secondary | ICD-10-CM

## 2016-07-12 DIAGNOSIS — I2510 Atherosclerotic heart disease of native coronary artery without angina pectoris: Secondary | ICD-10-CM | POA: Diagnosis not present

## 2016-07-12 NOTE — Patient Instructions (Signed)
Medication Instructions:  Your physician recommends that you continue on your current medications as directed. Please refer to the Current Medication list given to you today.   Labwork: None Ordered   Testing/Procedures: None Ordered   Follow-Up: Your physician wants you to follow-up in: 6 months with Dr. Nahser.  You will receive a reminder letter in the mail two months in advance. If you don't receive a letter, please call our office to schedule the follow-up appointment.   If you need a refill on your cardiac medications before your next appointment, please call your pharmacy.   Thank you for choosing [facility]! Latorya Bautch, RN [phone]    

## 2016-07-12 NOTE — Progress Notes (Signed)
[name] Date of Birth  Dec 15, 1960 [place] [address]    [place]   [place], [place]  [address]    [place], [place]  [address] [phone]  Fax  [phone]  [phone]  Fax [phone]  Problem list: 1.  Coronary artery disease-status post CABG - Sept. 2008 2.  Morbid obesity-status post 140 pound weight loss 3. Hyperlipidemia 4. Hypertension  History of Present Illness:  55 year old gentleman with a history of coronary artery disease. He has a history of obesity but has lost quite a bit of weight.  He's done well since I last saw him. He's not had any episodes of chest pain or shortness of breath.  He is exercising regularly.  April 05, 2013:  [name] is doing OK.  He broke his R hip ( fell of the roof if his house)  He has recovered and is doing well.   He is back to exercising without problems.    May 30, 2014:  Doing well.    Oct. 6, 2016:  Doing well . Exercising regularly .  Lipids look great   Oct. 6, 2017:  Is doing ok.  Has slowed down some  exercising well BP is a bit higher Difficult to do his cardio since breaking his hip 4 years ago  Had his CMET and lipids several days go - levels are great   Current Outpatient Prescriptions on File Prior to Visit  Medication Sig Dispense Refill  . acetaminophen (TYLENOL) 500 MG tablet Take 500 mg by mouth every 6 (six) hours as needed. For pain/fever    . aspirin 81 MG tablet Take 1 tablet (81 mg total) by mouth daily.    [name] Kitchen atorvastatin (LIPITOR) 20 MG tablet Take 1 tablet (20 mg total) by mouth daily at 6 PM. 90 tablet 3  . cetirizine (ZYRTEC) 10 MG tablet Take 10 mg by mouth daily.    . Melatonin 3 MG TABS Take 1 tablet by mouth at bedtime as needed. For sleep     No current facility-administered medications on file prior to visit.     Allergies  Allergen Reactions  . Cefaclor Rash    Past Medical History:  Diagnosis Date  . Allergy     . Asthma   . CAD (coronary artery disease)   . Hyperlipidemia   . Hypertension   . Left inguinal hernia   . Postsurgical aortocoronary bypass status   . Right inguinal hernia   . Sleep apnea     Past Surgical History:  Procedure Laterality Date  . double coronary artery bypass    . FRACTURE SURGERY    . HERNIA REPAIR     left and right inguinal  . ORIF ACETABULAR FRACTURE  09/08/2012   Procedure: OPEN REDUCTION INTERNAL FIXATION (ORIF) ACETABULAR FRACTURE;  Surgeon: Rozanna Box, MD;  Location: [facility];  Service: Orthopedics;  Laterality: Right;    History  Smoking Status  . Never Smoker  Smokeless Tobacco  . Never Used    History  Alcohol Use No    Family History  Problem Relation Age of Onset  . Hypertension Mother   . Hyperlipidemia Mother   . Hypertension Father   . Hyperlipidemia Father     Reviw of Systems:  Reviewed in the HPI.  All other systems are negative.  Physical Exam: Blood pressure (!) 150/88, pulse (!) 50, height 5\' 9"  (1.753 m), weight 201  lb 9.6 oz (91.4 kg). General: Well developed, well nourished, in no acute distress.  Head: Normocephalic, atraumatic, sclera non-icteric, mucus membranes are moist,   Neck: Supple. Negative for carotid bruits. JVD not elevated.  Lungs: Clear bilaterally to auscultation without wheezes, rales, or rhonchi. Breathing is unlabored.  Heart: RRR with S1 S2. No murmurs, rubs, or gallops appreciated.  Abdomen: Soft, non-tender, non-distended with normoactive bowel sounds. No hepatomegaly. No rebound/guarding. No obvious abdominal masses.  Msk:  Strength and tone appear normal for age.  Extremities: No clubbing or cyanosis. No edema.  Distal pedal pulses are 2+ and equal bilaterally.  Neuro: Alert and oriented X 3. Moves all extremities spontaneously.  Psych:  Responds to questions appropriately with a normal affect.  ECG: Oct. 6, 2017:   Sinus bradycardia at 50    Assessment / Plan:   1.  Coronary  artery disease-status post CABG - Sept. 2008 - he's doing well. He's not had any episodes of angina. Continue current medications.  2.  Morbid obesity-status post 140 pound weight loss.    3. Hyperlipidemia-  continue current dose of atorvastatin. His lipids are well controlled.  4. Hypertension- BP is well controlled .    [name], MD  07/12/2016 4:12 PM    [facility] [place],  [place], [place]  [address] Pager [phone] Phone: [phone]; Fax: [phone]

## 2016-08-06 ENCOUNTER — Other Ambulatory Visit: Payer: Self-pay | Admitting: Cardiovascular Disease

## 2017-01-17 DIAGNOSIS — H2513 Age-related nuclear cataract, bilateral: Secondary | ICD-10-CM | POA: Diagnosis not present

## 2017-01-20 DIAGNOSIS — R000 Tachycardia, unspecified: Secondary | ICD-10-CM | POA: Diagnosis not present

## 2017-01-30 DIAGNOSIS — R000 Tachycardia, unspecified: Secondary | ICD-10-CM | POA: Diagnosis not present

## 2017-02-05 ENCOUNTER — Telehealth: Payer: Self-pay | Admitting: Cardiovascular Disease

## 2017-02-05 DIAGNOSIS — E782 Mixed hyperlipidemia: Secondary | ICD-10-CM

## 2017-02-05 NOTE — Telephone Encounter (Signed)
Spoke with patient and advised that we will order fasting labs in preparation for upcoming appointment with Dr. Acie Fredrickson. I scheduled him for lab work on 5/3 and he is aware to come in fasting anytime between 7:30 am and 4:30 pm. He thanked me for the call.

## 2017-02-05 NOTE — Telephone Encounter (Signed)
Pt calling to see if he need labs, hasn't had them done in a while-pl call and let him know, he would like to have done before next appt with Nasher--pls call

## 2017-02-06 ENCOUNTER — Other Ambulatory Visit: Payer: 59 | Admitting: *Deleted

## 2017-02-06 ENCOUNTER — Encounter (INDEPENDENT_AMBULATORY_CARE_PROVIDER_SITE_OTHER): Payer: Self-pay

## 2017-02-06 DIAGNOSIS — E782 Mixed hyperlipidemia: Secondary | ICD-10-CM

## 2017-02-06 LAB — COMPREHENSIVE METABOLIC PANEL
ALT: 17 IU/L (ref 0–44)
AST: 17 IU/L (ref 0–40)
Albumin/Globulin Ratio: 1.9 (ref 1.2–2.2)
Albumin: 3.9 g/dL (ref 3.5–5.5)
Alkaline Phosphatase: 82 IU/L (ref 39–117)
BUN/Creatinine Ratio: 14 (ref 9–20)
BUN: 14 mg/dL (ref 6–24)
Bilirubin Total: 0.9 mg/dL (ref 0.0–1.2)
CO2: 24 mmol/L (ref 18–29)
Calcium: 8.8 mg/dL (ref 8.7–10.2)
Chloride: 99 mmol/L (ref 96–106)
Creatinine, Ser: 0.97 mg/dL (ref 0.76–1.27)
GFR calc Af Amer: 100 mL/min/{1.73_m2} (ref >59)
GFR calc non Af Amer: 86 mL/min/{1.73_m2} (ref >59)
Globulin, Total: 2.1 g/dL (ref 1.5–4.5)
Glucose: 88 mg/dL (ref 65–99)
Potassium: 4.1 mmol/L (ref 3.5–5.2)
Sodium: 137 mmol/L (ref 134–144)
Total Protein: 6 g/dL (ref 6.0–8.5)

## 2017-02-06 LAB — LIPID PANEL
Chol/HDL Ratio: 1.9 ratio (ref 0.0–5.0)
Cholesterol, Total: 124 mg/dL (ref 100–199)
HDL: 64 mg/dL (ref >39)
LDL Calculated: 50 mg/dL (ref 0–99)
Triglycerides: 48 mg/dL (ref 0–149)
VLDL Cholesterol Cal: 10 mg/dL (ref 5–40)

## 2017-02-13 ENCOUNTER — Encounter: Payer: Self-pay | Admitting: Cardiovascular Disease

## 2017-02-13 ENCOUNTER — Ambulatory Visit (INDEPENDENT_AMBULATORY_CARE_PROVIDER_SITE_OTHER): Payer: 59 | Admitting: Cardiovascular Disease

## 2017-02-13 ENCOUNTER — Encounter (INDEPENDENT_AMBULATORY_CARE_PROVIDER_SITE_OTHER): Payer: Self-pay

## 2017-02-13 VITALS — BP 146/92 | HR 55 | Ht 69.0 in | Wt 199.4 lb

## 2017-02-13 DIAGNOSIS — I2510 Atherosclerotic heart disease of native coronary artery without angina pectoris: Secondary | ICD-10-CM | POA: Diagnosis not present

## 2017-02-13 DIAGNOSIS — I10 Essential (primary) hypertension: Secondary | ICD-10-CM | POA: Diagnosis not present

## 2017-02-13 MED ORDER — LOSARTAN POTASSIUM 50 MG PO TABS: 50.0000 mg | ORAL_TABLET | Freq: Every day | ORAL | 11 refills | Status: DC

## 2017-02-13 NOTE — Progress Notes (Signed)
Gelene Mink Date of Birth  03-Apr-1961 [place] [address]    [place]   [place], [place]  [address]    [place], [place]  [address] [phone]  Fax  [phone]  [phone]  Fax [phone]  Problem list: 1.  Coronary artery disease-status post CABG - Sept. 2008 2.  Morbid obesity-status post 140 pound weight loss 3. Hyperlipidemia 4. Hypertension  History of Present Illness:  56 year old gentleman with a history of coronary artery disease. He has a history of obesity but has lost quite a bit of weight.  He's done well since I last saw him. He's not had any episodes of chest pain or shortness of breath.  He is exercising regularly.  April 05, 2013:  [name] is doing OK.  He broke his R hip ( fell of the roof if his house)  He has recovered and is doing well.   He is back to exercising without problems.    May 30, 2014:  Doing well.    Oct. 6, 2016:  Doing well . Exercising regularly .  Lipids look great   Oct. 6, 2017:  Is doing ok.  Has slowed down some  exercising well BP is a bit higher Difficult to do his cardio since breaking his hip 4 years ago  Had his CMET and lipids several days go - levels are great   Feb 13, 2017:  [name] is doing ok  BP is still a bit elevated.  Is still eating salt  Is not exercising as much.   Broke his hip 3-4 years ago  ( fell through the roof)    Current Outpatient Prescriptions  Medication Sig Dispense Refill  . acetaminophen (TYLENOL) 500 MG tablet Take 500 mg by mouth every 6 (six) hours as needed. For pain/fever    . aspirin 81 MG tablet Take 1 tablet (81 mg total) by mouth daily.    Marland Kitchen atorvastatin (LIPITOR) 20 MG tablet Take 20 mg by mouth daily. Insurance allows 30 day supply    . cetirizine (ZYRTEC) 10 MG tablet Take 10 mg by mouth daily.    Marland Kitchen FLUCELVAX QUADRIVALENT 0.5 ML SUSY Inject as directed once.  0  . Melatonin 3 MG TABS Take 1 tablet by mouth at  bedtime as needed. For sleep    . losartan (COZAAR) 50 MG tablet Take 1 tablet (50 mg total) by mouth daily. 30 tablet 11   No current facility-administered medications for this visit.      Allergies  Allergen Reactions  . Cefaclor Rash    Past Medical History:  Diagnosis Date  . Allergy   . Asthma   . CAD (coronary artery disease)   . Hyperlipidemia   . Hypertension   . Left inguinal hernia   . Postsurgical aortocoronary bypass status   . Right inguinal hernia   . Sleep apnea     Past Surgical History:  Procedure Laterality Date  . double coronary artery bypass    . FRACTURE SURGERY    . HERNIA REPAIR     left and right inguinal  . ORIF ACETABULAR FRACTURE  09/08/2012   Procedure: OPEN REDUCTION INTERNAL FIXATION (ORIF) ACETABULAR FRACTURE;  Surgeon: Rozanna Box, MD;  Location: [facility];  Service: Orthopedics;  Laterality: Right;    History  Smoking Status  . Never Smoker  Smokeless Tobacco  . Never Used    History  Alcohol Use  No    Family History  Problem Relation Age of Onset  . Hypertension Mother   . Hyperlipidemia Mother   . Hypertension Father   . Hyperlipidemia Father     Reviw of Systems:  Reviewed in the HPI.  All other systems are negative.  Physical Exam: Blood pressure (!) 146/92, pulse (!) 55, height 5\' 9"  (1.753 m), weight 199 lb 6.4 oz (90.4 kg). General: Well developed, well nourished, in no acute distress.  Head: Normocephalic, atraumatic, sclera non-icteric, mucus membranes are moist,   Neck: Supple. Negative for carotid bruits. JVD not elevated.  Lungs: Clear bilaterally to auscultation without wheezes, rales, or rhonchi. Breathing is unlabored.  Heart: RRR with S1 S2. No murmurs, rubs, or gallops appreciated.  Abdomen: Soft, non-tender, non-distended with normoactive bowel sounds. No hepatomegaly. No rebound/guarding. No obvious abdominal masses.  Msk:  Strength and tone appear normal for age.  Extremities: No clubbing or  cyanosis. No edema.  Distal pedal pulses are 2+ and equal bilaterally.  Neuro: Alert and oriented X 3. Moves all extremities spontaneously.  Psych:  Responds to questions appropriately with a normal affect.  ECG:  Assessment / Plan:   1.  Coronary artery disease-status post CABG - Sept. 2008 - he's doing well. He's not had any episodes of angina. Continue current medications.  2.  Morbid obesity-status post 140 pound weight loss.    3. Hyperlipidemia-  continue current dose of atorvastatin. His lipids are well controlled.  4. Hypertension- BP remains a little elevated.  Will start Losartan 50 mg a day . Check BMP in 3 weeks     [name], MD  02/13/2017 4:38 PM    [place],  [place], [place]  [address] Pager [phone] Phone: [phone]; Fax: [phone]

## 2017-02-13 NOTE — Patient Instructions (Signed)
Medication Instructions:  START Losartan 50 mg once daily   Labwork: Your physician recommends that you return for lab work in: 3 weeks for basic metabolic panel   Testing/Procedures: None Ordered   Follow-Up: Your physician wants you to follow-up in: 6 months with Dr. Acie Fredrickson.  You will receive a reminder letter in the mail two months in advance. If you don't receive a letter, please call our office to schedule the follow-up appointment.   If you need a refill on your cardiac medications before your next appointment, please call your pharmacy.   Thank you for choosing [facility]! Christen Bame, RN [phone]

## 2017-02-27 ENCOUNTER — Telehealth: Payer: Self-pay | Admitting: Cardiovascular Disease

## 2017-02-27 DIAGNOSIS — G4730 Sleep apnea, unspecified: Secondary | ICD-10-CM

## 2017-02-27 NOTE — Telephone Encounter (Signed)
New message     Pt has question on replacement CPAP machine, his broke and he needs a prescription

## 2017-02-28 ENCOUNTER — Encounter: Payer: Self-pay | Admitting: Registered"

## 2017-02-28 ENCOUNTER — Encounter: Payer: 59 | Attending: Cardiovascular Disease | Admitting: Registered"

## 2017-02-28 DIAGNOSIS — Z713 Dietary counseling and surveillance: Secondary | ICD-10-CM | POA: Diagnosis not present

## 2017-02-28 DIAGNOSIS — I2510 Atherosclerotic heart disease of native coronary artery without angina pectoris: Secondary | ICD-10-CM | POA: Diagnosis not present

## 2017-02-28 DIAGNOSIS — I10 Essential (primary) hypertension: Secondary | ICD-10-CM | POA: Diagnosis not present

## 2017-02-28 NOTE — Patient Instructions (Addendum)
Useful websites for finding nutrition facts when dining out:  Www.calorieking.com  https://www.healthydiningfinder.com Tips:  Consider making salads at home and can use ready cooked chicken to avoid cooking.  Other salad topping ideas: Tuna, nuts (omega-3)  Consider mindful eating and pay attention to hunger and satiety cues, aim for at least 20 minutes for a meal.  Consider asking waiters to not bring the bread or chips before the meal.  Keep working on getting plan for more physical activity.

## 2017-02-28 NOTE — Progress Notes (Signed)
Medical Nutrition Therapy:  Appt start time: 0945 end time:  1035.  Assessment:  Primary concerns today: Pt states last time he was here he was provided an idea to tweak to his breakfast and he feels it help him to maintain his weight loss (he reports ~145 lb loss after heart surgery in 2008). Pt returns because he had started to gain weight, but reports his weight has stabilized since September.  Pt states he enjoys running, but since a hip injury his physical activity has decreased. Pt reports he enjoys riding his bike, but he hasn't started due to his spouse's fear of the dangers of riding a bike in traffic. Pt plans to find a place where it is safe to ride.  Pt reports he keeps a food and weight journal. Pt eats breakfast at home, but lunch and dinner are usually eaten at restaurants.   Preferred Learning Style:   No preference indicated   Learning Readiness:   Ready  MEDICATIONS: Reviewed   DIETARY INTAKE:  Usual eating pattern includes 3 meals and 0-1 snacks per day.  24-hr recall:  B ( AM): yogurt with nuts (changed from cereal)  Snk ( AM): none  L ( PM): salad, minimal dressing, cheese or deli meat or grilled chicken OR soup Snk ( PM): none D ( PM): similar to lunch ([employer], [place], [employer]) Snk ( PM): none OR cake OR ice cream Beverages: water or coke zero, juice occasionally (2 L fluid)  Usual physical activity: ADL,   Estimated energy needs: 2000 calories 225 g carbohydrates 150 g protein 56 g fat  Progress Towards Goal(s):  In progress.   Nutritional Diagnosis:  NI-5.5 Imbalance of nutrients As related to excessive sodium intake.  As evidenced by frequent dining out per dietary recall.    Intervention:  Nutrition Education. Discussed sources of sodium and methods to find information when dining out. Discussed changes in metabolism in the aging process. Discussed mindful eating paying attention to hunger and satiety cues.  Plan: Useful websites for  finding nutrition facts when dining out:  Www.calorieking.com  https://www.healthydiningfinder.com Tips:  Consider making salads at home and can use ready cooked chicken to avoid cooking.  Other salad topping ideas: Tuna, nuts (omega-3)  Consider mindful eating and pay attention to hunger and satiety cues, aim for at least 20 minutes for a meal.  Consider asking waiters to not bring the bread or chips before the meal.  Keep working on getting plan for more physical activity.  Teaching Method Utilized:  Visual Auditory  Handouts given during visit include:  none  Barriers to learning/adherence to lifestyle change: none  Demonstrated degree of understanding via:  Teach Back   Monitoring/Evaluation:  Dietary intake, exercise, and body weight prn.

## 2017-02-28 NOTE — Telephone Encounter (Signed)
°  Follow Up ° °Returning call from yesterday. Please call. °

## 2017-03-04 NOTE — Telephone Encounter (Signed)
Patient called needing a prescription for a replacement cpap machine.  Patient's sleep has been followed in the past by Dr Danton Sewer.  Patient sees Dr Acie Fredrickson for cardiology. Patients last sleep study was 09/25/2010. Patient has requested a new prescription for a new cpap machine.  Informed patient he will need a sleep study referral to get a new sleep study to re-establish sleep with Dr Radford Pax because it has been several years since his last sleep study. I will send a message to Dr Acie Fredrickson for further direction.

## 2017-03-04 NOTE — Telephone Encounter (Signed)
Please refer to Dr. Radford Pax for sleep eval. I will not be able to re-order a CPAP for him

## 2017-03-05 NOTE — Telephone Encounter (Signed)
Order for sleep study has been placed.  I will forward request to Gershon Cull, [place] for scheduling.

## 2017-03-07 ENCOUNTER — Other Ambulatory Visit: Payer: 59

## 2017-03-10 ENCOUNTER — Encounter: Payer: Self-pay | Admitting: *Deleted

## 2017-03-10 NOTE — Telephone Encounter (Signed)
This encounter was created in error - please disregard.

## 2017-03-10 NOTE — Telephone Encounter (Addendum)
Informed patient of upcoming sleep study and patient understanding was verbalized. Patient understands his sleep study is scheduled for Friday April 18 2017. Patient understands his sleep study will be done at [facility] sleep lab. Patient understands he will receive a sleep packet in a week or so. Patient understands to call if he does not receive the sleep packet in a timely manner. Patient agrees with treatment and thanked me for call

## 2017-03-21 ENCOUNTER — Other Ambulatory Visit: Payer: 59

## 2017-03-21 DIAGNOSIS — Z125 Encounter for screening for malignant neoplasm of prostate: Secondary | ICD-10-CM | POA: Diagnosis not present

## 2017-03-21 DIAGNOSIS — I2510 Atherosclerotic heart disease of native coronary artery without angina pectoris: Secondary | ICD-10-CM | POA: Diagnosis not present

## 2017-03-21 DIAGNOSIS — Z0000 Encounter for general adult medical examination without abnormal findings: Secondary | ICD-10-CM | POA: Diagnosis not present

## 2017-03-21 DIAGNOSIS — Z23 Encounter for immunization: Secondary | ICD-10-CM | POA: Diagnosis not present

## 2017-03-21 LAB — BASIC METABOLIC PANEL
BUN/Creatinine Ratio: 14 (ref 9–20)
BUN: 16 mg/dL (ref 6–24)
CO2: 22 mmol/L (ref 20–29)
Calcium: 8.7 mg/dL (ref 8.7–10.2)
Chloride: 103 mmol/L (ref 96–106)
Creatinine, Ser: 1.13 mg/dL (ref 0.76–1.27)
GFR calc Af Amer: 83 mL/min/{1.73_m2} (ref >59)
GFR calc non Af Amer: 72 mL/min/{1.73_m2} (ref >59)
Glucose: 89 mg/dL (ref 65–99)
Potassium: 4.1 mmol/L (ref 3.5–5.2)
Sodium: 141 mmol/L (ref 134–144)

## 2017-04-18 ENCOUNTER — Encounter (HOSPITAL_BASED_OUTPATIENT_CLINIC_OR_DEPARTMENT_OTHER): Payer: 59

## 2017-05-20 ENCOUNTER — Encounter (HOSPITAL_BASED_OUTPATIENT_CLINIC_OR_DEPARTMENT_OTHER): Payer: 59

## 2017-06-08 DIAGNOSIS — Z23 Encounter for immunization: Secondary | ICD-10-CM | POA: Diagnosis not present

## 2017-07-11 ENCOUNTER — Telehealth: Payer: Self-pay | Admitting: *Deleted

## 2017-07-11 NOTE — Telephone Encounter (Signed)
-----   Message from Almyra Free sent at 07/11/2017  2:47 PM EDT ----- Regarding: Reschedule? Pt has [employer]. Given the time frames [facility] is currently processing, I do not expect a response on this patient by Monday.  Thank you, Amy

## 2017-07-11 NOTE — Telephone Encounter (Signed)
[  07/11/2017 4:00 PM] Sydell Axon:  tried to call patient no answer, LM that we will wait until Monday lunch time before cancelling.  Called patient back again Per DPR spoke to [facility] (wife) and informed her that the patient's sleep study may change due to insurance not making a determination by Monday. Sheryl will relay the message to her husband and she thanked me for calling.

## 2017-07-14 ENCOUNTER — Encounter (HOSPITAL_BASED_OUTPATIENT_CLINIC_OR_DEPARTMENT_OTHER): Payer: 59

## 2017-07-14 NOTE — Telephone Encounter (Signed)
Reached out to patient with a new sleep study date of August 25 2017 and patient agrees with this date.

## 2017-08-03 ENCOUNTER — Other Ambulatory Visit: Payer: Self-pay | Admitting: Cardiovascular Disease

## 2017-08-25 ENCOUNTER — Encounter (HOSPITAL_BASED_OUTPATIENT_CLINIC_OR_DEPARTMENT_OTHER): Payer: 59

## 2017-09-11 ENCOUNTER — Telehealth: Payer: Self-pay | Admitting: *Deleted

## 2017-09-11 NOTE — Telephone Encounter (Signed)
-----   Message from Almyra Free sent at 09/09/2017  3:51 PM EST ----- Regarding: FW: In lab sleep study denied NIna, per Dr. Acie Fredrickson, please switch patient to a home study. Thanks. ----- Message ----- From: Thayer Headings, MD Sent: 09/09/2017  10:59 AM To: Almyra Free Subject: RE: In lab sleep study denied                  Yes,  Please switch to the "at home" sleep study   Thanks  ----- Message ----- From: Almyra Free Sent: 09/08/2017   2:12 PM To: Thayer Headings, MD Subject: In lab sleep study denied                      In lab sleep study denied by insurance. Is it okay to switch patient to a home sleep study? Thank you, Amy

## 2017-09-11 NOTE — Telephone Encounter (Signed)
Will order a home sleep test

## 2017-09-25 ENCOUNTER — Encounter (HOSPITAL_BASED_OUTPATIENT_CLINIC_OR_DEPARTMENT_OTHER): Payer: 59

## 2017-10-13 DIAGNOSIS — G4733 Obstructive sleep apnea (adult) (pediatric): Secondary | ICD-10-CM | POA: Diagnosis not present

## 2017-10-13 DIAGNOSIS — G4730 Sleep apnea, unspecified: Secondary | ICD-10-CM | POA: Diagnosis not present

## 2017-10-20 ENCOUNTER — Telehealth: Payer: Self-pay | Admitting: *Deleted

## 2017-10-20 NOTE — Telephone Encounter (Signed)
-----   Message from Sueanne Margarita, MD sent at 10/13/2017  9:39 PM EST ----- Severe sleep apnea by home sleep study.  Please set up CPAP titration in lab

## 2017-10-20 NOTE — Telephone Encounter (Signed)
Patient says he has an autotitration machine he purchased offline, he has the data downloaded and would to send Dr Radford Pax the results. Patient does not want a CPAP titration. Patient he states he only need a prescription for a mask. Patient is willing to come in for an office visit.

## 2017-11-11 NOTE — Telephone Encounter (Signed)
That is fine - he will need an OV

## 2017-11-11 NOTE — Telephone Encounter (Signed)
Home sleep test completed on 10/12/2017.

## 2017-11-11 NOTE — Telephone Encounter (Signed)
Staff message has been sent to Dr Radford Pax and I am awaiting a response.

## 2017-11-11 NOTE — Telephone Encounter (Signed)
Patient has an appointment scheduled for January 12 2018 at 8 am.

## 2018-01-11 NOTE — Progress Notes (Addendum)
Cardiology Office Note:    Date:  01/12/2018   ID:  [name], DOB Nov 13, 1960, MRN [id number]  PCP:  Marda Stalker, PA-C  Cardiologist:  No primary care provider on file.    Referring MD: Marda Stalker, PA-C   Chief Complaint  Patient presents with  . Coronary Artery Disease  . Hypertension  . Hyperlipidemia  . Sleep Apnea    History of Present Illness:    [name] is a 57 y.o. male with a hx of of ASCAD s/p CABG, HTN , hyperlipidemia with OSA.  He has been on PAP in the past but has not been seeing a sleep MD.  He needs new supplies and mask and is now here to establish with a new sleep MD.  He is doing well with his CPAP device.Marland Kitchen  He tolerates the nasal pillow mask and feels the pressure is adequate.  He says that his mask is falling apart and needs a new mask. He travels to [place] a lot for his job and has 2 machines. He feels rested in the am and has no significant daytime sleepiness.  He denies any significant mouth or nasal dryness or nasal congestion.  He does not think that he snores.     Past Medical History:  Diagnosis Date  . Allergy   . Asthma   . CAD (coronary artery disease)   . Hyperlipidemia   . Hypertension   . Left inguinal hernia   . Postsurgical aortocoronary bypass status   . Right inguinal hernia   . Sleep apnea     Past Surgical History:  Procedure Laterality Date  . double coronary artery bypass    . FRACTURE SURGERY    . HERNIA REPAIR     left and right inguinal  . ORIF ACETABULAR FRACTURE  09/08/2012   Procedure: OPEN REDUCTION INTERNAL FIXATION (ORIF) ACETABULAR FRACTURE;  Surgeon: Rozanna Box, MD;  Location: [facility];  Service: Orthopedics;  Laterality: Right;    Current Medications:    Allergies:   Cefaclor   Social History   Socioeconomic History  . Marital status: Married    Spouse name: Not on file  . Number of children: Not on file  . Years of education: Not on file  . Highest education level: Not on file   Occupational History  . Not on file  Social Needs  . Financial resource strain: Not on file  . Food insecurity:    Worry: Not on file    Inability: Not on file  . Transportation needs:    Medical: Not on file    Non-medical: Not on file  Tobacco Use  . Smoking status: Never Smoker  . Smokeless tobacco: Never Used  Substance and Sexual Activity  . Alcohol use: No  . Drug use: No  . Sexual activity: Not on file  Lifestyle  . Physical activity:    Days per week: Not on file    Minutes per session: Not on file  . Stress: Not on file  Relationships  . Social connections:    Talks on phone: Not on file    Gets together: Not on file    Attends religious service: Not on file    Active member of club or organization: Not on file    Attends meetings of clubs or organizations: Not on file    Relationship status: Not on file  Other Topics Concern  . Not on file  Social History Narrative  . Not on  file     Family History: The patient's family history includes Hyperlipidemia in his father and mother; Hypertension in his father and mother.  ROS:   Please see the history of present illness.    ROS  All other systems reviewed and negative.   EKGs/Labs/Other Studies Reviewed:    The following studies were reviewed today: none  EKG:  EKG is not ordered today.   Recent Labs: 02/06/2017: ALT 17 03/21/2017: BUN 16; Creatinine, Ser 1.13; Potassium 4.1; Sodium 141   Recent Lipid Panel    Component Value Date/Time   CHOL 124 02/06/2017 0805   TRIG 48 02/06/2017 0805   HDL 64 02/06/2017 0805   CHOLHDL 1.9 02/06/2017 0805   CHOLHDL 1.8 07/08/2016 0817   VLDL 12 07/08/2016 0817   LDLCALC 50 02/06/2017 0805    Physical Exam:    VS:  BP 130/86   Pulse 68   Ht 5\' 9"  (1.753 m)   Wt 205 lb 12.8 oz (93.4 kg)   BMI 30.39 kg/m     Wt Readings from Last 3 Encounters:  01/12/18 205 lb 12.8 oz (93.4 kg)  02/13/17 199 lb 6.4 oz (90.4 kg)  07/12/16 201 lb 9.6 oz (91.4 kg)      GEN:  Well nourished, well developed in no acute distress HEENT: Normal NECK: No JVD; No carotid bruits LYMPHATICS: No lymphadenopathy CARDIAC: RRR, no murmurs, rubs, gallops RESPIRATORY:  Clear to auscultation without rales, wheezing or rhonchi  ABDOMEN: Soft, non-tender, non-distended MUSCULOSKELETAL:  No edema; No deformity  SKIN: Warm and dry NEUROLOGIC:  Alert and oriented x 3 PSYCHIATRIC:  Normal affect   ASSESSMENT:    1. OBSTRUCTIVE SLEEP APNEA   2. Essential hypertension    PLAN:    In order of problems listed above:  1.  OSA - he brought in both of his machine downloads today which show an AHI of 4.4/hr on auto CPAP with 84% compliance on one device and 60%.   The patient is tolerating PAP therapy well without any problems.  The patient has been using and benefiting from PAP use and will continue to benefit from therapy. I will order him a new ResMed Nasal pillow mask.     2.  HTN - BP is well controlled on exam today.  He will continue on Losartan 50mg  daily   Medication Adjustments/Labs and Tests Ordered: Current medicines are reviewed at length with the patient today.  Concerns regarding medicines are outlined above.  No orders of the defined types were placed in this encounter.  No orders of the defined types were placed in this encounter.   Signed, Fransico Him, MD  01/12/2018 8:22 AM    [facility]

## 2018-01-12 ENCOUNTER — Encounter: Payer: Self-pay | Admitting: Cardiology

## 2018-01-12 ENCOUNTER — Ambulatory Visit (INDEPENDENT_AMBULATORY_CARE_PROVIDER_SITE_OTHER): Payer: 59 | Admitting: Cardiology

## 2018-01-12 VITALS — BP 130/86 | HR 68 | Ht 69.0 in | Wt 205.8 lb

## 2018-01-12 DIAGNOSIS — G4733 Obstructive sleep apnea (adult) (pediatric): Secondary | ICD-10-CM | POA: Diagnosis not present

## 2018-01-12 DIAGNOSIS — I10 Essential (primary) hypertension: Secondary | ICD-10-CM

## 2018-01-12 DIAGNOSIS — L570 Actinic keratosis: Secondary | ICD-10-CM | POA: Diagnosis not present

## 2018-01-12 DIAGNOSIS — D229 Melanocytic nevi, unspecified: Secondary | ICD-10-CM | POA: Diagnosis not present

## 2018-01-12 DIAGNOSIS — L821 Other seborrheic keratosis: Secondary | ICD-10-CM | POA: Diagnosis not present

## 2018-01-12 NOTE — Patient Instructions (Addendum)
Medication Instructions:  Your physician recommends that you continue on your current medications as directed. Please refer to the Current Medication list given to you today.  If you need a refill on your cardiac medications, please contact your pharmacy first.  Labwork: None ordered   Testing/Procedures: None ordered   Follow-Up: Your physician wants you to follow-up in: 1 year with Dr. Radford Pax. You will receive a reminder letter in the mail two months in advance. If you don't receive a letter, please call our office to schedule the follow-up appointment.  Any Other Special Instructions Will Be Listed Below (If Applicable). Resmed Airfit P20 mask and cpap supplies   Thank you for choosing [facility], RN  [phone]  If you need a refill on your cardiac medications before your next appointment, please call your pharmacy.

## 2018-01-17 DIAGNOSIS — M542 Cervicalgia: Secondary | ICD-10-CM | POA: Diagnosis not present

## 2018-01-17 DIAGNOSIS — M546 Pain in thoracic spine: Secondary | ICD-10-CM | POA: Diagnosis not present

## 2018-01-22 ENCOUNTER — Other Ambulatory Visit: Payer: Self-pay | Admitting: Cardiovascular Disease

## 2018-01-28 ENCOUNTER — Telehealth: Payer: Self-pay | Admitting: *Deleted

## 2018-01-28 NOTE — Telephone Encounter (Signed)
-----   Message from Sueanne Margarita, MD sent at 01/15/2018  2:19 PM EDT ----- Regarding: RE: Supplies You will need to see if he wants a DME or order on line  Traci ----- Message ----- From: Freada Bergeron, CMA Sent: 01/15/2018   2:13 PM To: Sueanne Margarita, MD Subject: Supplies                                       Dr Radford Pax, Per your 01/12/18 OV you wanted to order a new resmed pillow mask for this patient. He purchased this machine off line and does not appear to have a dme. Who should I order his supplies from?

## 2018-01-28 NOTE — Telephone Encounter (Signed)
Reached out to patient to have him choose a dme or if he wants to order on line lmtcb.

## 2018-02-03 NOTE — Telephone Encounter (Signed)
Patient has decided to order his new mask off line. Patient feels it would more affordable that ordering with an agency.

## 2018-02-19 ENCOUNTER — Other Ambulatory Visit: Payer: Self-pay | Admitting: Cardiovascular Disease

## 2018-03-04 ENCOUNTER — Other Ambulatory Visit: Payer: Self-pay

## 2018-03-04 ENCOUNTER — Emergency Department (HOSPITAL_COMMUNITY)
Admission: EM | Admit: 2018-03-04 | Discharge: 2018-03-04 | Disposition: A | Discharge status: Home / Self Care | Payer: 59 | Attending: Emergency Medicine | Admitting: Emergency Medicine

## 2018-03-04 ENCOUNTER — Emergency Department (HOSPITAL_COMMUNITY): Payer: 59

## 2018-03-04 ENCOUNTER — Encounter (HOSPITAL_COMMUNITY): Payer: Self-pay | Admitting: Emergency Medicine

## 2018-03-04 DIAGNOSIS — R110 Nausea: Secondary | ICD-10-CM | POA: Insufficient documentation

## 2018-03-04 DIAGNOSIS — R011 Cardiac murmur, unspecified: Secondary | ICD-10-CM | POA: Diagnosis not present

## 2018-03-04 DIAGNOSIS — R1030 Lower abdominal pain, unspecified: Secondary | ICD-10-CM | POA: Diagnosis not present

## 2018-03-04 DIAGNOSIS — R109 Unspecified abdominal pain: Secondary | ICD-10-CM | POA: Diagnosis present

## 2018-03-04 DIAGNOSIS — Z79899 Other long term (current) drug therapy: Secondary | ICD-10-CM | POA: Diagnosis not present

## 2018-03-04 DIAGNOSIS — Z7982 Long term (current) use of aspirin: Secondary | ICD-10-CM | POA: Diagnosis not present

## 2018-03-04 DIAGNOSIS — I2510 Atherosclerotic heart disease of native coronary artery without angina pectoris: Secondary | ICD-10-CM | POA: Diagnosis not present

## 2018-03-04 DIAGNOSIS — R1031 Right lower quadrant pain: Secondary | ICD-10-CM | POA: Diagnosis not present

## 2018-03-04 LAB — URINALYSIS, ROUTINE W REFLEX MICROSCOPIC
Bilirubin Urine: NEGATIVE
Glucose, UA: NEGATIVE mg/dL
Ketones, ur: 20 mg/dL — AB
Leukocytes, UA: NEGATIVE
Nitrite: NEGATIVE
Protein, ur: NEGATIVE mg/dL
Specific Gravity, Urine: 1.02 (ref 1.005–1.030)
pH: 7 (ref 5.0–8.0)

## 2018-03-04 LAB — I-STAT TROPONIN, ED: Troponin i, poc: 0 ng/mL (ref 0.00–0.08)

## 2018-03-04 LAB — CBC
HCT: 45.3 % (ref 39.0–52.0)
Hemoglobin: 15.1 g/dL (ref 13.0–17.0)
MCH: 30 pg (ref 26.0–34.0)
MCHC: 33.3 g/dL (ref 30.0–36.0)
MCV: 89.9 fL (ref 78.0–100.0)
Platelets: 220 10*3/uL (ref 150–400)
RBC: 5.04 MIL/uL (ref 4.22–5.81)
RDW: 13.2 % (ref 11.5–15.5)
WBC: 9.5 10*3/uL (ref 4.0–10.5)

## 2018-03-04 LAB — COMPREHENSIVE METABOLIC PANEL
ALT: 23 U/L (ref 17–63)
AST: 22 U/L (ref 15–41)
Albumin: 4.2 g/dL (ref 3.5–5.0)
Alkaline Phosphatase: 71 U/L (ref 38–126)
Anion gap: 13 (ref 5–15)
BUN: 15 mg/dL (ref 6–20)
CO2: 27 mmol/L (ref 22–32)
Calcium: 10 mg/dL (ref 8.9–10.3)
Chloride: 100 mmol/L — ABNORMAL LOW (ref 101–111)
Creatinine, Ser: 1.32 mg/dL — ABNORMAL HIGH (ref 0.61–1.24)
GFR calc Af Amer: >60 mL/min (ref >60)
GFR calc non Af Amer: 58 mL/min — ABNORMAL LOW (ref >60)
Glucose, Bld: 105 mg/dL — ABNORMAL HIGH (ref 65–99)
Potassium: 4.1 mmol/L (ref 3.5–5.1)
Sodium: 140 mmol/L (ref 135–145)
Total Bilirubin: 1.3 mg/dL — ABNORMAL HIGH (ref 0.3–1.2)
Total Protein: 7 g/dL (ref 6.5–8.1)

## 2018-03-04 LAB — LIPASE, BLOOD: Lipase: 29 U/L (ref 11–51)

## 2018-03-04 MED ORDER — ONDANSETRON HCL 4 MG/2ML IJ SOLN: 4.0000 mg | Freq: Once | INTRAMUSCULAR | Status: DC

## 2018-03-04 MED ORDER — ONDANSETRON 4 MG PO TBDP
4.0000 mg | ORAL_TABLET | Freq: Once | ORAL | Status: AC | PRN
  Administered 2018-03-04: 4 mg via ORAL
  Filled 2018-03-04: qty 1

## 2018-03-04 MED ORDER — ONDANSETRON 4 MG PO TBDP: 4.0000 mg | ORAL_TABLET | Freq: Three times a day (TID) | ORAL | 0 refills | Status: DC | PRN

## 2018-03-04 MED ORDER — IOHEXOL 300 MG/ML  SOLN
100.0000 mL | Freq: Once | INTRAMUSCULAR | Status: AC | PRN
  Administered 2018-03-04: 100 mL via INTRAVENOUS

## 2018-03-04 MED ORDER — SODIUM CHLORIDE 0.9 % IV BOLUS
500.0000 mL | Freq: Once | INTRAVENOUS | Status: AC
  Administered 2018-03-04: 500 mL via INTRAVENOUS

## 2018-03-04 NOTE — ED Triage Notes (Addendum)
Pt reports nausea and epigastric pain since last night, states he feels like he ate something bad, denies cp or sob. resp e/u, nad. Skin pale and dry, states he had some diaphoresis last night.

## 2018-03-04 NOTE — Discharge Instructions (Addendum)
Get rechecked immediately if you develop fevers, vomiting, worsening pain, cannot pass gas, or new concerning symptoms.

## 2018-03-04 NOTE — ED Notes (Signed)
States he can feel his stomach rumbling

## 2018-03-04 NOTE — ED Provider Notes (Signed)
[facility] EMERGENCY DEPARTMENT Provider Note   CSN: [id number] Arrival date & time: 03/04/18  0620     History   Chief Complaint Chief Complaint  Patient presents with  . Nausea  . Abdominal Pain    HPI [name] is a 57 y.o. male.  The history is provided by the patient. No language interpreter was used.  Abdominal Pain      [name] is a 57 y.o. male who presents to the Emergency Department complaining of abdominal pain.  Last night he developed central abdominal pain/cramping with associated nausea.  Sxs worsened throughout the night with associated belching.  He denies fevers but did feel hot.  No cough/chest pain/sob, vomiting, diarrhea, dysuria.  No prior similar sxs.  No sick contacts or known bad food exposures. A history of coronary artery disease, hypertension, hyperlipidemia, inguinal hernia repair. He does take an aspirin daily. Overall his pain is improving but is still present. He has ongoing nausea and belching.  Past Medical History:  Diagnosis Date  . Allergy   . Asthma   . CAD (coronary artery disease)   . Hyperlipidemia   . Hypertension   . Left inguinal hernia   . Postsurgical aortocoronary bypass status   . Right inguinal hernia   . Sleep apnea     Patient Active Problem List   Diagnosis Date Noted  . Fatigue 05/30/2014  . Trauma 09/14/2012  . Acute blood loss anemia 09/09/2012  . prophylaxis against hetereotopic ossification with right hip radiotherapy 09/09/2012  . Fall 09/07/2012  . Asthma 09/07/2012  . Pneumothorax on left 09/06/2012  . Pneumomediastinum ([place]) 09/06/2012  . Right acetabular fracture ([facility]) 09/06/2012  . Hip dislocation, right ([place]) 09/06/2012  . Lumbar transverse process fracture ([place]) 09/06/2012  . Coronary artery disease 11/11/2011  . Hyperlipemia 09/24/2010  . OBSTRUCTIVE SLEEP APNEA 09/24/2010  . Essential hypertension 09/24/2010  . ALLERGIC RHINITIS 09/24/2010    Past Surgical  History:  Procedure Laterality Date  . double coronary artery bypass    . FRACTURE SURGERY    . HERNIA REPAIR     left and right inguinal  . ORIF ACETABULAR FRACTURE  09/08/2012   Procedure: OPEN REDUCTION INTERNAL FIXATION (ORIF) ACETABULAR FRACTURE;  Surgeon: Rozanna Box, MD;  Location: [facility];  Service: Orthopedics;  Laterality: Right;        Home Medications    Prior to Admission medications   Medication Sig Start Date End Date Taking? Authorizing Provider  acetaminophen (TYLENOL) 500 MG tablet Take 500 mg by mouth every 6 (six) hours as needed (pain).    Yes [provider]  aspirin 81 MG tablet Take 1 tablet (81 mg total) by mouth daily. 10/08/12  Yes Nahser, Wonda Cheng, MD  atorvastatin (LIPITOR) 20 MG tablet TAKE 1 TABLET BY MOUTH DAILY AT 6 PM.**INSURANCE ALLOWS 30 DAY 02/19/18  Yes Nahser, Wonda Cheng, MD  cetirizine (ZYRTEC) 10 MG tablet Take 10 mg by mouth daily.   Yes [provider]  losartan (COZAAR) 50 MG tablet Take 1 tablet (50 mg total) by mouth daily. 01/22/18  Yes Nahser, Wonda Cheng, MD  ondansetron (ZOFRAN ODT) 4 MG disintegrating tablet Take 1 tablet (4 mg total) by mouth every 8 (eight) hours as needed for nausea or vomiting. 03/04/18   Quintella Reichert, MD    Family History Family History  Problem Relation Age of Onset  . Hypertension Mother   . Hyperlipidemia Mother   . Hypertension Father   .  Hyperlipidemia Father     Social History Social History   Tobacco Use  . Smoking status: Never Smoker  . Smokeless tobacco: Never Used  Substance Use Topics  . Alcohol use: No  . Drug use: No     Allergies   Cefaclor   Review of Systems Review of Systems  Gastrointestinal: Positive for abdominal pain.  All other systems reviewed and are negative.    Physical Exam Updated Vital Signs BP 131/74 (BP Location: Left Arm)   Pulse (!) 54   Temp 98.1 F (36.7 C) (Oral)   Resp 16   SpO2 100%   Physical Exam  Constitutional: He is  oriented to person, place, and time. He appears well-developed and well-nourished.  HENT:  Head: Normocephalic and atraumatic.  Cardiovascular: Normal rate and regular rhythm.  Murmur heard. Systolic ejection murmur  Pulmonary/Chest: Effort normal and breath sounds normal. No respiratory distress.  Abdominal: Soft. There is no rebound and no guarding.  Mild right lower quadrant tenderness, no guarding or rebound.  Musculoskeletal: He exhibits no edema or tenderness.  Neurological: He is alert and oriented to person, place, and time.  Skin: Skin is warm and dry.  Psychiatric: He has a normal mood and affect. His behavior is normal.  Nursing note and vitals reviewed.    ED Treatments / Results  Labs (all labs ordered are listed, but only abnormal results are displayed) Labs Reviewed  COMPREHENSIVE METABOLIC PANEL - Abnormal; Notable for the following components:      Result Value   Chloride 100 (*)    Glucose, Bld 105 (*)    Creatinine, Ser 1.32 (*)    Total Bilirubin 1.3 (*)    GFR calc non Af Amer 58 (*)    All other components within normal limits  URINALYSIS, ROUTINE W REFLEX MICROSCOPIC - Abnormal; Notable for the following components:   APPearance CLOUDY (*)    Hgb urine dipstick SMALL (*)    Ketones, ur 20 (*)    Bacteria, UA FEW (*)    All other components within normal limits  LIPASE, BLOOD  CBC  I-STAT TROPONIN, ED    EKG EKG Interpretation  Date/Time:  Wednesday Mar 04 2018 06:34:51 EDT Ventricular Rate:  80 PR Interval:  164 QRS Duration: 98 QT Interval:  380 QTC Calculation: 438 R Axis:   99 Text Interpretation:  Sinus rhythm with Premature atrial complexes with Abberant conduction Rightward axis Cannot rule out Anterior infarct , age undetermined Abnormal ECG Confirmed by Quintella Reichert [phone]) on 03/04/2018 2:41:27 PM   Radiology Ct Abdomen Pelvis W Contrast  Result Date: 03/04/2018 CLINICAL DATA:  Abdominal pain. EXAM: CT ABDOMEN AND PELVIS WITH  CONTRAST TECHNIQUE: Multidetector CT imaging of the abdomen and pelvis was performed using the standard protocol following bolus administration of intravenous contrast. CONTRAST:  181mL OMNIPAQUE IOHEXOL 300 MG/ML  SOLN COMPARISON:  09/05/2012 FINDINGS: Lower chest: No acute abnormality. Hepatobiliary: No focal liver abnormality is seen. No gallstones, gallbladder wall thickening, or biliary dilatation. Pancreas: Unremarkable. No pancreatic ductal dilatation or surrounding inflammatory changes. Spleen: Normal in size without focal abnormality. Adrenals/Urinary Tract: Adrenal glands are unremarkable. Small 10 mm hypodense, fluid attenuating left renal mass most consistent with a cyst. Kidneys are normal, without renal calculi, solid mass, or hydronephrosis. Bladder is unremarkable. Stomach/Bowel: Multiple mildly distended fluid-filled small bowel loops in the lower abdomen/pelvis measuring up to 3.5 cm in diameter with decompressed distal small bowel concerning for a partial small bowel obstruction versus enteritis. No pneumatosis,  pneumoperitoneum or portal venous gas. Vascular/Lymphatic: No significant vascular findings are present. No enlarged abdominal or pelvic lymph nodes. Reproductive: Prostate is unremarkable. Other: No abdominal wall hernia or abnormality. No abdominopelvic ascites. Musculoskeletal: No acute osseous abnormality. No aggressive osseous lesion. Old healed right acetabular fracture transfixed with a sideplate and multiple interlocking screws. IMPRESSION: 1. Multiple mildly distended fluid-filled small bowel loops in the lower abdomen/pelvis measuring up to 3.5 cm in diameter with decompressed distal small bowel concerning for a partial small bowel obstruction versus enteritis. Electronically Signed   By: Kathreen Devoid   On: 03/04/2018 14:13    Procedures Procedures (including critical care time)  Medications Ordered in ED Medications  ondansetron (ZOFRAN-ODT) disintegrating tablet 4 mg (4  mg Oral Given 03/04/18 0658)  sodium chloride 0.9 % bolus 500 mL (0 mLs Intravenous Stopped 03/04/18 1438)  iohexol (OMNIPAQUE) 300 MG/ML solution 100 mL (100 mLs Intravenous Contrast Given 03/04/18 1350)     Initial Impression / Assessment and Plan / ED Course  I have reviewed the triage vital signs and the nursing notes.  Pertinent labs & imaging results that were available during my care of the patient were reviewed by me and considered in my medical decision making (see chart for details).     Patient here for evaluation of nausea and abdominal pain. Overall as abdominal pain is improving he does have mild tenderness on evaluation. CT abdomen with partial bowel obstruction versus enteritis. He is passing flatus and bulging in the department. On repeat assessment he does have mild tenderness without any guarding. He does feel improved. He is able to tolerate oral fluids without difficulty. Presentation is not consistent with SBO, appendicitis, diverticulitis. Discussed with patient home care with oral fluid hydration with advancing diet as tolerated. Discussed close outpatient follow-up as well as return precautions.  Final Clinical Impressions(s) / ED Diagnoses   Final diagnoses:  Nausea  Lower abdominal pain    ED Discharge Orders        Ordered    ondansetron (ZOFRAN ODT) 4 MG disintegrating tablet  Every 8 hours PRN     03/04/18 1542       Quintella Reichert, MD 03/04/18 1640

## 2018-03-04 NOTE — ED Notes (Signed)
Patient c/o abd. Pain onset last pm , states he was getting ready to eat dinner and his abd. Started hurting. Was able to eat his dinner went to bed around 1030 pm and was awaken with increased abd. Pain at 130am. States he was having periods of nausea and painl no vomiting or diarrhea. Currently ratea pain 2-3/10

## 2018-03-04 NOTE — ED Notes (Signed)
Patient able to ambulate independently  

## 2018-03-20 DIAGNOSIS — J069 Acute upper respiratory infection, unspecified: Secondary | ICD-10-CM | POA: Diagnosis not present

## 2018-03-31 ENCOUNTER — Ambulatory Visit: Payer: 59 | Admitting: Cardiovascular Disease

## 2018-04-07 DIAGNOSIS — J069 Acute upper respiratory infection, unspecified: Secondary | ICD-10-CM | POA: Diagnosis not present

## 2018-04-16 DIAGNOSIS — Z0000 Encounter for general adult medical examination without abnormal findings: Secondary | ICD-10-CM | POA: Diagnosis not present

## 2018-04-16 DIAGNOSIS — I2510 Atherosclerotic heart disease of native coronary artery without angina pectoris: Secondary | ICD-10-CM | POA: Diagnosis not present

## 2018-04-16 DIAGNOSIS — I10 Essential (primary) hypertension: Secondary | ICD-10-CM | POA: Diagnosis not present

## 2018-04-16 DIAGNOSIS — G4733 Obstructive sleep apnea (adult) (pediatric): Secondary | ICD-10-CM | POA: Diagnosis not present

## 2018-04-24 DIAGNOSIS — E049 Nontoxic goiter, unspecified: Secondary | ICD-10-CM | POA: Diagnosis not present

## 2018-04-24 DIAGNOSIS — I10 Essential (primary) hypertension: Secondary | ICD-10-CM | POA: Diagnosis not present

## 2018-04-24 DIAGNOSIS — I2510 Atherosclerotic heart disease of native coronary artery without angina pectoris: Secondary | ICD-10-CM | POA: Diagnosis not present

## 2018-04-24 DIAGNOSIS — Z789 Other specified health status: Secondary | ICD-10-CM | POA: Diagnosis not present

## 2018-05-28 ENCOUNTER — Encounter: Payer: Self-pay | Admitting: Cardiovascular Disease

## 2018-06-19 ENCOUNTER — Ambulatory Visit (INDEPENDENT_AMBULATORY_CARE_PROVIDER_SITE_OTHER): Payer: 59 | Admitting: Cardiovascular Disease

## 2018-06-19 ENCOUNTER — Encounter: Payer: Self-pay | Admitting: Cardiovascular Disease

## 2018-06-19 VITALS — BP 122/72 | HR 57 | Ht 69.0 in | Wt 208.0 lb

## 2018-06-19 DIAGNOSIS — I2510 Atherosclerotic heart disease of native coronary artery without angina pectoris: Secondary | ICD-10-CM | POA: Diagnosis not present

## 2018-06-19 DIAGNOSIS — E782 Mixed hyperlipidemia: Secondary | ICD-10-CM | POA: Diagnosis not present

## 2018-06-19 LAB — HEPATIC FUNCTION PANEL
ALT: 19 IU/L (ref 0–44)
AST: 19 IU/L (ref 0–40)
Albumin: 4 g/dL (ref 3.5–5.5)
Alkaline Phosphatase: 78 IU/L (ref 39–117)
Bilirubin Total: 0.6 mg/dL (ref 0.0–1.2)
Bilirubin, Direct: 0.24 mg/dL (ref 0.00–0.40)
Total Protein: 6 g/dL (ref 6.0–8.5)

## 2018-06-19 LAB — LIPID PANEL
Chol/HDL Ratio: 2 ratio (ref 0.0–5.0)
Cholesterol, Total: 110 mg/dL (ref 100–199)
HDL: 55 mg/dL (ref >39)
LDL Calculated: 45 mg/dL (ref 0–99)
Triglycerides: 50 mg/dL (ref 0–149)
VLDL Cholesterol Cal: 10 mg/dL (ref 5–40)

## 2018-06-19 LAB — BASIC METABOLIC PANEL
BUN/Creatinine Ratio: 13 (ref 9–20)
BUN: 15 mg/dL (ref 6–24)
CO2: 24 mmol/L (ref 20–29)
Calcium: 9.2 mg/dL (ref 8.7–10.2)
Chloride: 104 mmol/L (ref 96–106)
Creatinine, Ser: 1.17 mg/dL (ref 0.76–1.27)
GFR calc Af Amer: 79 mL/min/{1.73_m2} (ref >59)
GFR calc non Af Amer: 68 mL/min/{1.73_m2} (ref >59)
Glucose: 88 mg/dL (ref 65–99)
Potassium: 4.4 mmol/L (ref 3.5–5.2)
Sodium: 141 mmol/L (ref 134–144)

## 2018-06-19 MED ORDER — NITROGLYCERIN 0.4 MG SL SUBL: 0.4000 mg | SUBLINGUAL_TABLET | SUBLINGUAL | 3 refills | Status: DC | PRN

## 2018-06-19 NOTE — Progress Notes (Signed)
[name] Date of Birth  1961/03/08 [address]    [place]   [place], [place]  [address]    [place], [place]  [address] [phone]  Fax  [phone]  [phone]  Fax [phone]  Problem list: 1.  Coronary artery disease-status post CABG - Sept. 2008 2.  Morbid obesity-status post 140 pound weight loss 3. Hyperlipidemia 4. Hypertension     57 year old gentleman with a history of coronary artery disease. He has a history of obesity but has lost quite a bit of weight.  He's done well since I last saw him. He's not had any episodes of chest pain or shortness of breath.  He is exercising regularly.  April 05, 2013:  [name] is doing OK.  He broke his R hip ( fell of the roof if his house)  He has recovered and is doing well.   He is back to exercising without problems.    May 30, 2014:  Doing well.    Oct. 6, 2016:  Doing well . Exercising regularly .  Lipids look great   Oct. 6, 2017:  Is doing ok.  Has slowed down some  exercising well BP is a bit higher Difficult to do his cardio since breaking his hip 4 years ago  Had his CMET and lipids several days go - levels are great   Feb 13, 2017:  [name] is doing ok  BP is still a bit elevated.  Is still eating salt  Is not exercising as much.   Broke his hip 3-4 years ago  ( fell through the roof)   Sept. 13, 2019: [name] is seen today for follow-up of his coronary artery disease and hypertension.  He also has a history of hyperlipidemia. Works out at the [employer]  Has gained back some of his weight .     Current Outpatient Medications  Medication Sig Dispense Refill  . acetaminophen (TYLENOL) 500 MG tablet Take 500 mg by mouth every 6 (six) hours as needed (pain).     Marland Kitchen aspirin 81 MG tablet Take 1 tablet (81 mg total) by mouth daily.    Marland Kitchen atorvastatin (LIPITOR) 20 MG tablet Take 20 mg by mouth daily.    . cetirizine (ZYRTEC) 10 MG  tablet Take 10 mg by mouth daily.    Marland Kitchen losartan (COZAAR) 50 MG tablet Take 1 tablet (50 mg total) by mouth daily. 30 tablet 11  . nitroGLYCERIN (NITROSTAT) 0.4 MG SL tablet Place 1 tablet (0.4 mg total) under the tongue every 5 (five) minutes as needed for chest pain. 25 tablet 3   No current facility-administered medications for this visit.      Allergies  Allergen Reactions  . Cefaclor Rash    Past Medical History:  Diagnosis Date  . Allergy   . Asthma   . CAD (coronary artery disease)   . Hyperlipidemia   . Hypertension   . Left inguinal hernia   . Postsurgical aortocoronary bypass status   . Right inguinal hernia   . Sleep apnea     Past Surgical History:  Procedure Laterality Date  . double coronary artery bypass    . FRACTURE SURGERY    . HERNIA REPAIR     left and right inguinal  . ORIF ACETABULAR FRACTURE  09/08/2012   Procedure: OPEN REDUCTION INTERNAL FIXATION (ORIF) ACETABULAR FRACTURE;  Surgeon: Rozanna Box, MD;  Location: [facility];  Service: Orthopedics;  Laterality: Right;    Social History   Tobacco Use  Smoking Status Never Smoker  Smokeless Tobacco Never Used    Social History   Substance and Sexual Activity  Alcohol Use No    Family History  Problem Relation Age of Onset  . Hypertension Mother   . Hyperlipidemia Mother   . Hypertension Father   . Hyperlipidemia Father     Reviw of Systems:  Reviewed in the HPI.  All other systems are negative.  Physical Exam: Blood pressure 122/72, pulse (!) 57, height 5\' 9"  (1.753 m), weight 208 lb (94.3 kg), SpO2 98 %.  GEN:  Well nourished, well developed in no acute distress HEENT: Normal NECK: No JVD; No carotid bruits LYMPHATICS: No lymphadenopathy CARDIAC: RR, no murmurs, rubs, gallops RESPIRATORY:  Clear to auscultation without rales, wheezing or rhonchi  ABDOMEN: Soft, non-tender, non-distended MUSCULOSKELETAL:  No edema; No deformity  SKIN: Warm and dry NEUROLOGIC:  Alert and oriented  x 3    ECG:  Assessment / Plan:   1.  Coronary artery disease-status post CABG - Sept. 2008 -  He has not had any episodes of angina.  Overall is doing well.  2.  Morbid obesity-   has lost quite a bit of weight.  He is regained a little bit of that.  He knows that he needs to tighten up on his diet.  3. Hyperlipidemia-   continue atorvastatin.  Check labs today.  4. Hypertension-blood pressure is well controlled.    [name], MD  06/19/2018 9:43 AM    [facility] [address],  [place], [place]  [address] Pager [phone] Phone: [phone]; Fax: [phone]

## 2018-06-19 NOTE — Patient Instructions (Signed)
Medication Instructions:  Your physician recommends that you continue on your current medications as directed. Please refer to the Current Medication list given to you today.  A refill was sent in for your Sublingual Nitroglycerin  Labwork: TODAY: BMET, LFTS, LIPIDS  Testing/Procedures: None ordered  Follow-Up: Your physician wants you to follow-up in: 1 year with Dr. Acie Fredrickson. You will receive a reminder letter in the mail two months in advance. If you don't receive a letter, please call our office to schedule the follow-up appointment.   Any Other Special Instructions Will Be Listed Below (If Applicable).     If you need a refill on your cardiac medications before your next appointment, please call your pharmacy.

## 2018-08-21 ENCOUNTER — Telehealth: Payer: Self-pay | Admitting: *Deleted

## 2018-08-21 MED ORDER — LOSARTAN POTASSIUM 25 MG PO TABS: 50.0000 mg | ORAL_TABLET | Freq: Every day | ORAL | 3 refills | Status: DC

## 2018-08-21 NOTE — Telephone Encounter (Signed)
New Rx for Losartan 25 mg tablets, take 2 daily sent to [facility]

## 2018-08-21 NOTE — Telephone Encounter (Signed)
Received fax from [facility] indicating that the losartan 50 mg has been recalled. They are requesting an alternative. Please advise. Thanks, MI

## 2018-10-13 DIAGNOSIS — M76892 Other specified enthesopathies of left lower limb, excluding foot: Secondary | ICD-10-CM | POA: Diagnosis not present

## 2018-10-13 DIAGNOSIS — M25562 Pain in left knee: Secondary | ICD-10-CM | POA: Diagnosis not present

## 2018-12-31 DIAGNOSIS — J019 Acute sinusitis, unspecified: Secondary | ICD-10-CM | POA: Diagnosis not present

## 2019-01-09 DIAGNOSIS — M1000 Idiopathic gout, unspecified site: Secondary | ICD-10-CM | POA: Diagnosis not present

## 2019-01-17 ENCOUNTER — Other Ambulatory Visit: Payer: Self-pay | Admitting: Cardiovascular Disease

## 2019-09-23 ENCOUNTER — Other Ambulatory Visit: Payer: Self-pay | Admitting: Cardiovascular Disease

## 2019-09-24 ENCOUNTER — Other Ambulatory Visit: Payer: Self-pay | Admitting: Cardiovascular Disease

## 2019-11-18 ENCOUNTER — Telehealth: Payer: Self-pay | Admitting: Nurse Practitioner

## 2019-11-18 ENCOUNTER — Other Ambulatory Visit: Payer: Self-pay | Admitting: Cardiovascular Disease

## 2019-11-18 NOTE — Telephone Encounter (Signed)
Left message for patient to call back to schedule a one year follow-up visit with Dr. Acie Fredrickson. I advised that we will refill medications for time until visit is scheduled.

## 2019-12-06 ENCOUNTER — Ambulatory Visit: Payer: 59 | Admitting: Cardiology

## 2019-12-08 ENCOUNTER — Other Ambulatory Visit: Payer: Self-pay

## 2019-12-08 ENCOUNTER — Encounter: Payer: Self-pay | Admitting: Cardiovascular Disease

## 2019-12-08 ENCOUNTER — Ambulatory Visit (INDEPENDENT_AMBULATORY_CARE_PROVIDER_SITE_OTHER): Payer: 59 | Admitting: Cardiovascular Disease

## 2019-12-08 VITALS — BP 132/92 | HR 52 | Ht 69.0 in | Wt 209.0 lb

## 2019-12-08 DIAGNOSIS — I10 Essential (primary) hypertension: Secondary | ICD-10-CM

## 2019-12-08 MED ORDER — ATORVASTATIN CALCIUM 20 MG PO TABS: ORAL_TABLET | ORAL | 10 refills | Status: DC

## 2019-12-08 NOTE — Patient Instructions (Addendum)
Medication Instructions:   Your physician recommends that you continue on your current medications as directed. Please refer to the Current Medication list given to you today.  *If you need a refill on your cardiac medications before your next appointment, please call your pharmacy*   Follow-Up: At [facility], you and your health needs are our priority.  As part of our continuing mission to provide you with exceptional heart care, we have created designated Provider Care Teams.  These Care Teams include your primary Cardiologist (physician) and Advanced Practice Providers (APPs -  Physician Assistants and Nurse Practitioners) who all work together to provide you with the care you need, when you need it.  We recommend signing up for the patient portal called "MyChart".  Sign up information is provided on this After Visit Summary.  MyChart is used to connect with patients for Virtual Visits (Telemedicine).  Patients are able to view lab/test results, encounter notes, upcoming appointments, etc.  Non-urgent messages can be sent to your provider as well.   To learn more about what you can do with MyChart, go to [email or web address].    Your next appointment:   12 month(s)  The format for your next appointment:   In Person  Provider:   [name], MD

## 2019-12-08 NOTE — Progress Notes (Signed)
[name] Date of Birth  Jan 17, 1961 [place] [address]    [place]   [place], [place]  [address]    [place], [place]  [address] [phone]  Fax  [phone]  [phone]  Fax [phone]  Problem list: 1.  Coronary artery disease-status post CABG - Sept. 2008 2.  Morbid obesity-status post 140 pound weight loss 3. Hyperlipidemia 4. Hypertension     59 year old gentleman with a history of coronary artery disease. He has a history of obesity but has lost quite a bit of weight.  He's done well since I last saw him. He's not had any episodes of chest pain or shortness of breath.  He is exercising regularly.  April 05, 2013:  [name] is doing OK.  He broke his R hip ( fell of the roof if his house)  He has recovered and is doing well.   He is back to exercising without problems.    May 30, 2014:  Doing well.    Oct. 6, 2016:  Doing well . Exercising regularly .  Lipids look great   Oct. 6, 2017:  Is doing ok.  Has slowed down some  exercising well BP is a bit higher Difficult to do his cardio since breaking his hip 4 years ago  Had his CMET and lipids several days go - levels are great   Feb 13, 2017:  [name] is doing ok  BP is still a bit elevated.  Is still eating salt  Is not exercising as much.   Broke his hip 3-4 years ago  ( fell through the roof)   Sept. 13, 2019: [name] is seen today for follow-up of his coronary artery disease and hypertension.  He also has a history of hyperlipidemia. Works out at the [employer]  Has gained back some of his weight .   December 08, 2019:  Doing ok. No cp.   Is not exercsing as much.    Diet is going ok Still eating a moderately low carb diet.   He brought his lab work from [place].  His total cholesterol is 114.  The HDL is 46.  The triglyceride level is 51.  LDL is 58. Glucose is 91.  Creatinine is 1.2.  Sodium is 140.  Potassium is 4.8.  Liver  enzymes are normal.  Current Outpatient Medications  Medication Sig Dispense Refill  . acetaminophen (TYLENOL) 500 MG tablet Take 500 mg by mouth every 6 (six) hours as needed (pain).     Marland Kitchen aspirin 81 MG tablet Take 1 tablet (81 mg total) by mouth daily.    Marland Kitchen atorvastatin (LIPITOR) 20 MG tablet TAKE 1 TABLET BY MOUTH DAILY AT 6 PM.INSURANCE ALLOWS 30 DAY 30 tablet 1  . cetirizine (ZYRTEC) 10 MG tablet Take 10 mg by mouth daily.    Marland Kitchen losartan (COZAAR) 25 MG tablet Take 2 tablets (50 mg total) by mouth daily. Overdue for yearly appointment.  Will need appointment for further refills.  Attempt x 1 180 tablet 0  . nitroGLYCERIN (NITROSTAT) 0.4 MG SL tablet Place 1 tablet (0.4 mg total) under the tongue every 5 (five) minutes as needed for chest pain. 25 tablet 3   No current facility-administered medications for this visit.     Allergies  Allergen Reactions  . Cefaclor Rash    Past Medical History:  Diagnosis Date  . Allergy   . Asthma   .  CAD (coronary artery disease)   . Hyperlipidemia   . Hypertension   . Left inguinal hernia   . Postsurgical aortocoronary bypass status   . Right inguinal hernia   . Sleep apnea     Past Surgical History:  Procedure Laterality Date  . double coronary artery bypass    . FRACTURE SURGERY    . HERNIA REPAIR     left and right inguinal  . ORIF ACETABULAR FRACTURE  09/08/2012   Procedure: OPEN REDUCTION INTERNAL FIXATION (ORIF) ACETABULAR FRACTURE;  Surgeon: Rozanna Box, MD;  Location: [facility];  Service: Orthopedics;  Laterality: Right;    Social History   Tobacco Use  Smoking Status Never Smoker  Smokeless Tobacco Never Used    Social History   Substance and Sexual Activity  Alcohol Use No    Family History  Problem Relation Age of Onset  . Hypertension Mother   . Hyperlipidemia Mother   . Hypertension Father   . Hyperlipidemia Father     Reviw of Systems:  Reviewed in the HPI.  All other systems are negative.   Physical  Exam: Blood pressure (!) 132/92, pulse (!) 52, height 5\' 9"  (1.753 m), weight 209 lb (94.8 kg), SpO2 99 %.  GEN:  Well nourished, well developed in no acute distress HEENT: Normal NECK: No JVD; No carotid bruits LYMPHATICS: No lymphadenopathy CARDIAC: RRR , no murmurs, rubs, gallops RESPIRATORY:  Clear to auscultation without rales, wheezing or rhonchi  ABDOMEN: Soft, non-tender, non-distended MUSCULOSKELETAL:  No edema; No deformity  SKIN: Warm and dry NEUROLOGIC:  Alert and oriented x 3    ECG:   December 08, 2019:  Sinus brady at 52.  No ST or T wave changes   Assessment / Plan:   1.  Coronary artery disease-status post CABG - Sept. 2008 -  No angina .   Cont mets     2.  Morbid obesity-    Has regained a little weight .   Encourage him to continue to work on diet and exercise   3. Hyperlipidemia-   continue atorvastatin.  Check labs today.  4. Hypertension-blood pressure is well controlled.    [name], MD  12/08/2019 12:08 PM    [place],  [place], [place]  [address] Pager [phone] Phone: [phone]; Fax: [phone]

## 2019-12-16 ENCOUNTER — Other Ambulatory Visit: Payer: Self-pay | Admitting: Cardiovascular Disease

## 2020-01-31 ENCOUNTER — Ambulatory Visit: Payer: 59 | Admitting: Dermatology

## 2020-02-12 IMAGING — CT CT ABD-PELV W/ CM
2 of 5 series · 16 of 46 positions shown, 18 images · IV contrast (Omni 300)
Comparison: 09/05/2012

CLINICAL DATA: Abdominal pain.

EXAM:
CT ABDOMEN AND PELVIS WITH CONTRAST
TECHNIQUE: Multidetector CT imaging of the abdomen and pelvis was performed
using the standard protocol following bolus administration of
intravenous contrast.
CONTRAST:  100mL OMNIPAQUE IOHEXOL 300 MG/ML  SOLN

[Series 3: a/p w/ 5mm · axial · 0.95mm/px · z∈[+500,+950]mm · 13 of 102 slices shown, 15 images]
[im 6/102  soft-tissue]
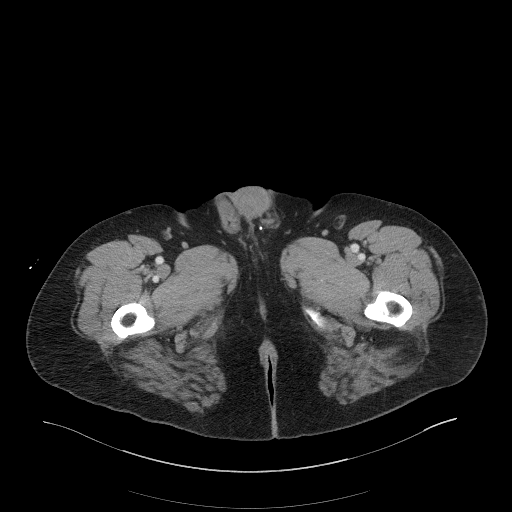
[im 6/102  bone]
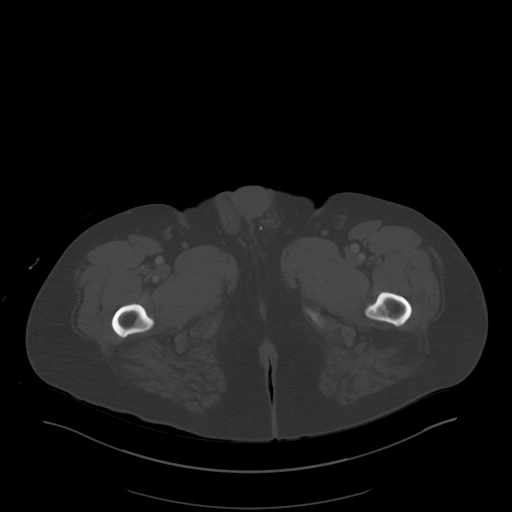
[im 16/102  soft-tissue]
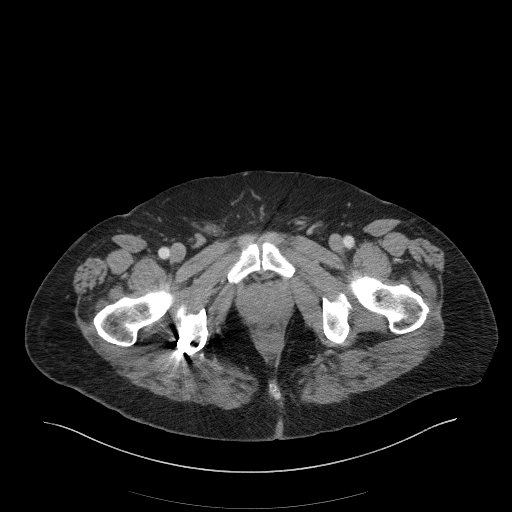
[im 21/102  soft-tissue]
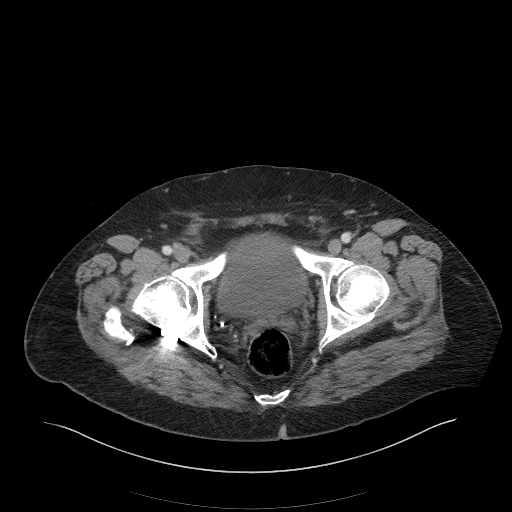
[im 31/102  soft-tissue]
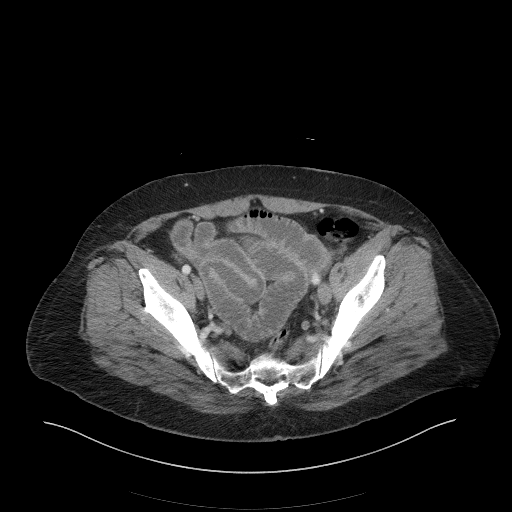
[im 36/102  soft-tissue]
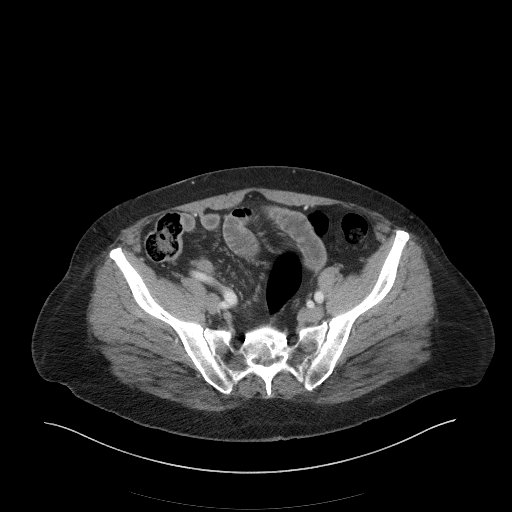
[im 46/102  soft-tissue]
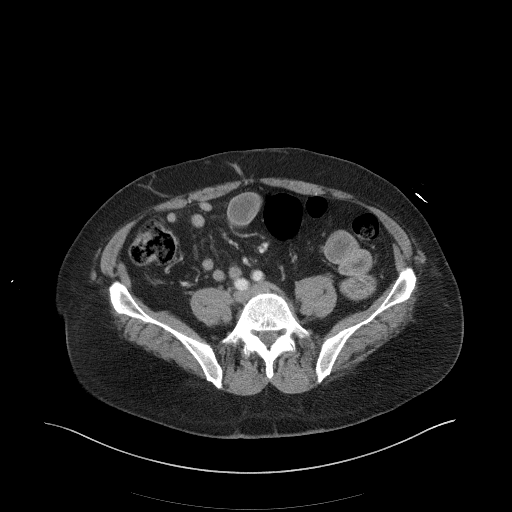
[im 51/102  soft-tissue]
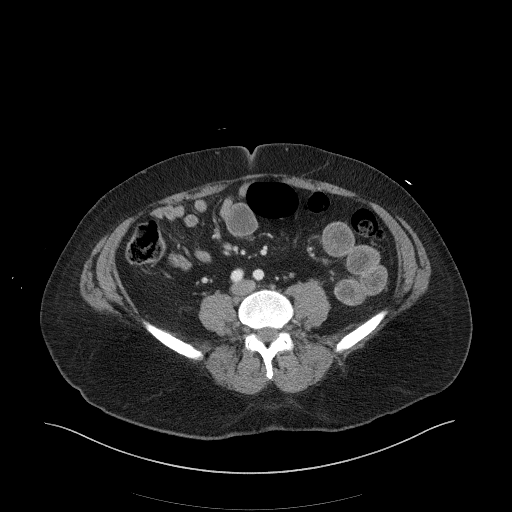
[im 56/102  soft-tissue]
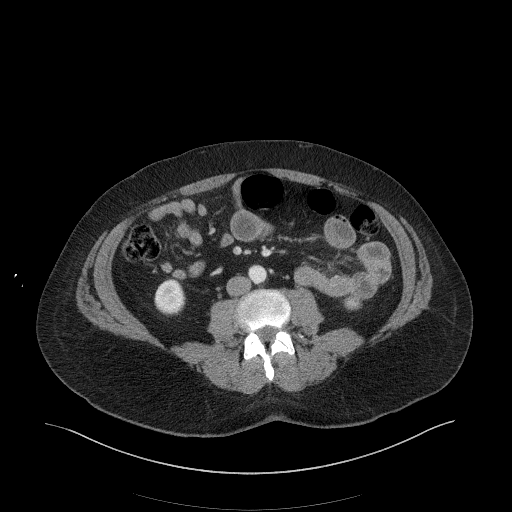
[im 66/102  soft-tissue]
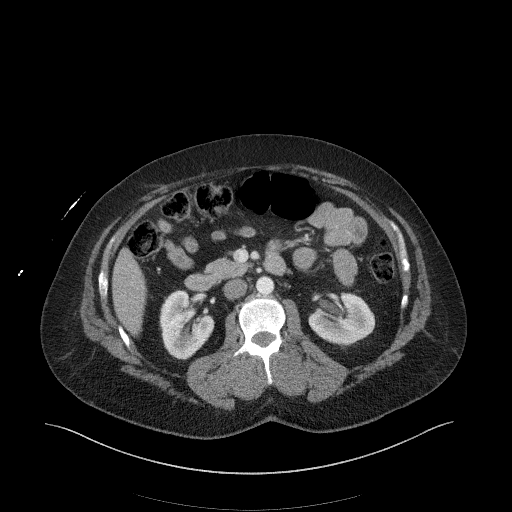
[im 66/102  bone]
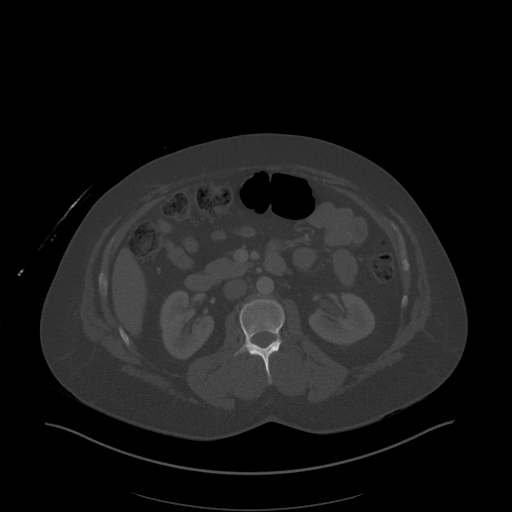
[im 71/102  soft-tissue]
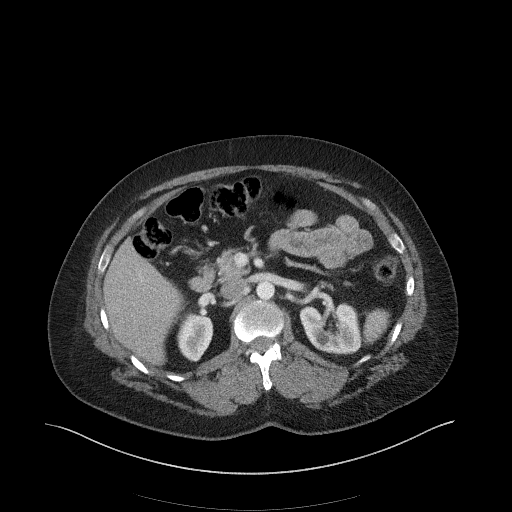
[im 81/102  soft-tissue]
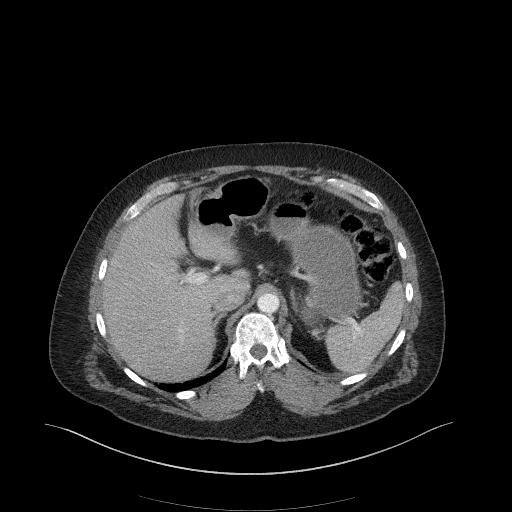
[im 86/102  soft-tissue]
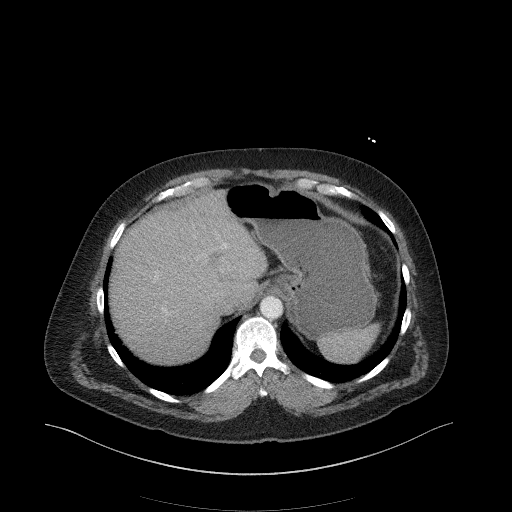
[im 96/102  soft-tissue]
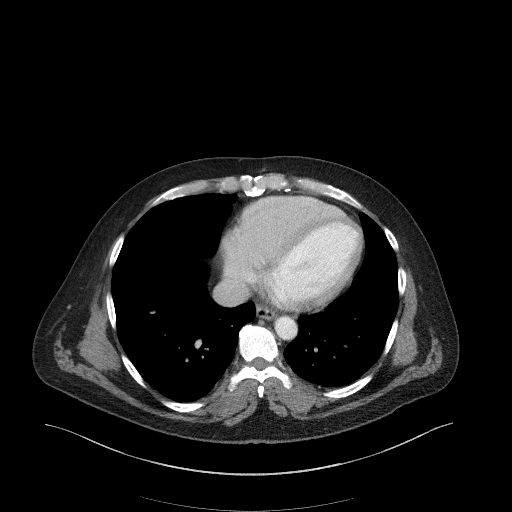

[Series 6: a/p w/ cor · coronal · 0.88mm/px · 3 of 155 slices shown]
[im 52/155  soft-tissue]
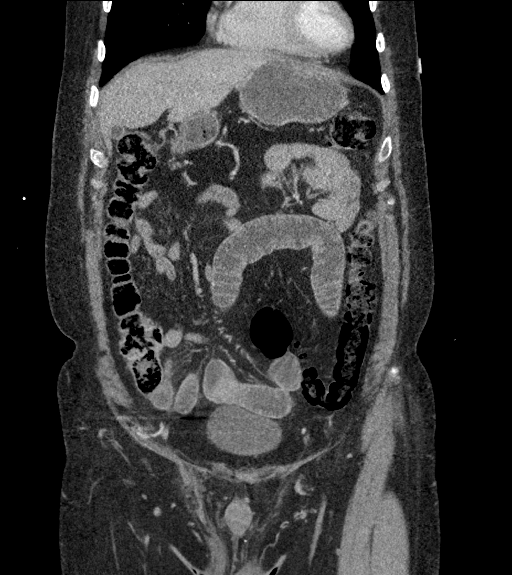
[im 69/155  soft-tissue]
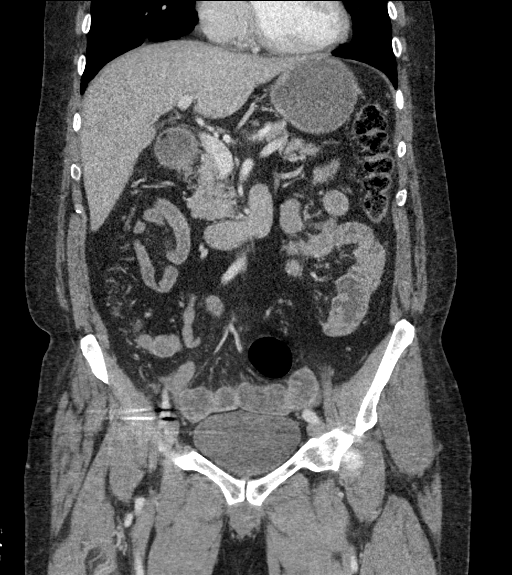
[im 86/155  soft-tissue]
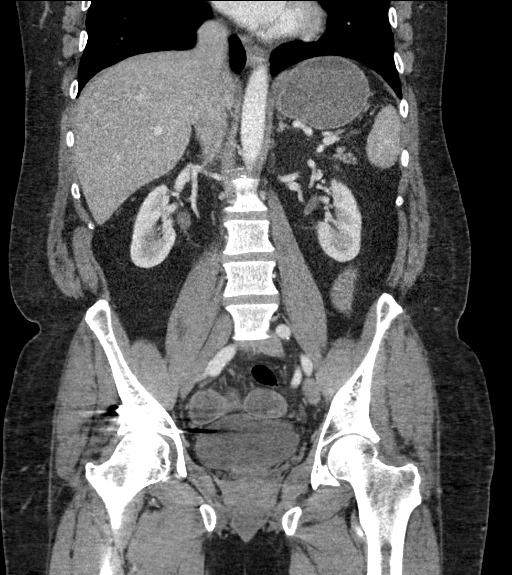

[16 of 46 positions shown; findings below may reference images not displayed]

FINDINGS: Lower chest: No acute abnormality.

Hepatobiliary: No focal liver abnormality is seen. No gallstones,
gallbladder wall thickening, or biliary dilatation.

Pancreas: Unremarkable. No pancreatic ductal dilatation or
surrounding inflammatory changes.

Spleen: Normal in size without focal abnormality.

Adrenals/Urinary Tract: Adrenal glands are unremarkable. Small 10 mm
hypodense, fluid attenuating left renal mass most consistent with a
cyst. Kidneys are normal, without renal calculi, solid mass, or
hydronephrosis. Bladder is unremarkable.

Stomach/Bowel: Multiple mildly distended fluid-filled small bowel
loops in the lower abdomen/pelvis measuring up to 3.5 cm in diameter
with decompressed distal small bowel concerning for a partial small
bowel obstruction versus enteritis.. No pneumatosis,
pneumoperitoneum or portal venous gas.

Vascular/Lymphatic: No significant vascular findings are present. No
enlarged abdominal or pelvic lymph nodes.

Reproductive: Prostate is unremarkable.

Other: No abdominal wall hernia or abnormality. No abdominopelvic
ascites.

Musculoskeletal: No acute osseous abnormality. No aggressive osseous
lesion. Old healed right acetabular fracture transfixed with a
sideplate and multiple interlocking screws.
IMPRESSION: 1. Multiple mildly distended fluid-filled small bowel loops in the
lower abdomen/pelvis measuring up to 3.5 cm in diameter with
decompressed distal small bowel concerning for a partial small bowel
obstruction versus enteritis..

## 2020-02-16 ENCOUNTER — Ambulatory Visit (INDEPENDENT_AMBULATORY_CARE_PROVIDER_SITE_OTHER): Payer: 59 | Admitting: Dermatology

## 2020-02-16 ENCOUNTER — Other Ambulatory Visit: Payer: Self-pay

## 2020-02-16 DIAGNOSIS — L570 Actinic keratosis: Secondary | ICD-10-CM

## 2020-02-16 DIAGNOSIS — D225 Melanocytic nevi of trunk: Secondary | ICD-10-CM

## 2020-02-16 DIAGNOSIS — Z85828 Personal history of other malignant neoplasm of skin: Secondary | ICD-10-CM | POA: Diagnosis not present

## 2020-02-16 DIAGNOSIS — D229 Melanocytic nevi, unspecified: Secondary | ICD-10-CM

## 2020-02-16 MED ORDER — TOLAK 4 % EX CREA: 1.0000 | TOPICAL_CREAM | Freq: Every day | CUTANEOUS | 1 refills | Status: DC

## 2020-02-16 NOTE — Progress Notes (Signed)
FALL PDT RED LIGHT

## 2020-02-16 NOTE — Progress Notes (Signed)
   Follow-Up Visit   Subjective  [name] is a 59 y.o. male who presents for the following: Annual Exam (No concerns just full body check. ).  Crusts Location: Arms Duration: Several months Quality: Increased number  associated Signs/Symptoms: Occasional mild itch Modifying Factors:  Severity:  Timing: Context: History of skin cancer  The following portions of the chart were reviewed this encounter and updated as appropriate: Tobacco  Allergies  Meds  Problems  Med Hx  Surg Hx  Fam Hx      Objective  Well appearing patient in no apparent distress; mood and affect are within normal limits.  A full examination was performed including scalp, head, eyes, ears, nose, lips, neck, chest, axillae, abdomen, back, buttocks, bilateral upper extremities, bilateral lower extremities, hands, feet, fingers, toes, fingernails, and toenails. All findings within normal limits unless otherwise noted below.   Assessment & Plan  AK (actinic keratosis) (4) Left Hand - Posterior; Left Forearm - Posterior; Right Forearm - Posterior; Scalp  Topical fluorouracil in the late fall, total of 28 applications  Nevus Mid Back  Annual skin examination  Personal history of skin cancer Right Upper Back  We will try topical fluorouracil for 2 to 3 weeks; if no reaction he can discontinue this. 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  Clinically and with dermoscopy this is more likely a benign keratosis (resembles the same diagnosis on the right sideburn) than recurrent Bowens.  Since he will be trying topical fluorouracil on his arm and scalp, I encouraged Brad to put a dot of this medicine on the right back shoulder if there is no reaction in 2 to 3 weeks, it supports this being benign.  For the more diffuse solar keratoses on particularly the left arm and the top of the  scalp, he will retry a topical fluorouracil in the late fall or winter with a target being 28 total applications if nightly application is too irritating, he will stop and give me a call and we will reintroduce a second cream he will apply the cream perhaps an hour after supper so it will not come off on the pillowcase and irritate his eyes.  The spots on the left temple are brown textured and represent benign keratoses and for now we will leave these.  Otherwise from the waist up there are no suspicious lesions no atypical moles or anything that currently requires biopsy.

## 2020-02-18 ENCOUNTER — Encounter: Payer: Self-pay | Admitting: Dermatology

## 2020-06-09 ENCOUNTER — Other Ambulatory Visit: Payer: Self-pay

## 2020-06-09 ENCOUNTER — Ambulatory Visit (INDEPENDENT_AMBULATORY_CARE_PROVIDER_SITE_OTHER): Payer: 59 | Admitting: *Deleted

## 2020-06-09 DIAGNOSIS — L570 Actinic keratosis: Secondary | ICD-10-CM | POA: Diagnosis not present

## 2020-06-09 MED ORDER — AMINOLEVULINIC ACID HCL 10 % EX GEL
2000.0000 mg | Freq: Once | CUTANEOUS | Status: AC
  Administered 2020-06-09: 2000 mg via TOPICAL

## 2020-06-09 NOTE — Progress Notes (Signed)
Patient here for light therapy (PDT). Photodynamic Therapy Procedure Note Diagnosis: Actinic keratosis Location: Scalp Informed Consent: Discussed risks (burning, pain, redness, peeling, severe sunburn-like reaction, blistering, discoloration, lack of resolution) and benefits of the procedure, as well as the alternatives. Informed consent was obtained. Preparation: After cleansing the skin, the area to be treated was coated with Levulan.  This was allowed to sit on the skin for 90 hours. Procedure Details: The patient was placed under the light source with appropriate eye protection for 20 minutes. After completing the treatment, the patient applied sunscreen to the treated areas. Patient tolerated the procedure well Plan: Avoid any sun exposure for the next 24 hours. Wear sunscreen daily for the next week. Observe normal sun precautions thereafter. Recommend OTC analgesia as needed for pain. Follow-up in 12 weeks.

## 2020-09-06 ENCOUNTER — Ambulatory Visit: Payer: 59 | Admitting: Dermatology

## 2020-11-13 ENCOUNTER — Ambulatory Visit (INDEPENDENT_AMBULATORY_CARE_PROVIDER_SITE_OTHER): Payer: 59 | Admitting: Dermatology

## 2020-11-13 ENCOUNTER — Encounter: Payer: Self-pay | Admitting: Dermatology

## 2020-11-13 ENCOUNTER — Other Ambulatory Visit: Payer: Self-pay

## 2020-11-13 DIAGNOSIS — L570 Actinic keratosis: Secondary | ICD-10-CM

## 2020-11-13 DIAGNOSIS — Z85828 Personal history of other malignant neoplasm of skin: Secondary | ICD-10-CM | POA: Diagnosis not present

## 2020-11-13 DIAGNOSIS — Z1283 Encounter for screening for malignant neoplasm of skin: Secondary | ICD-10-CM | POA: Diagnosis not present

## 2020-11-13 MED ORDER — TOLAK 4 % EX CREA
TOPICAL_CREAM | CUTANEOUS | 1 refills | Status: DC
Start: 2020-11-13 — End: 2023-03-07

## 2020-11-25 ENCOUNTER — Encounter: Payer: Self-pay | Admitting: Dermatology

## 2020-11-25 NOTE — Progress Notes (Signed)
   Follow-Up Visit   Subjective  [name] is a 60 y.o. male who presents for the following: Follow-up (PDT- scalp- had very good peel).  Actinic keratoses Location: Arms, face, scalp Duration:  Quality:  Associated Signs/Symptoms: Modifying Factors: PDT Severity:  Timing: Context:   Objective  Well appearing patient in no apparent distress; mood and affect are within normal limits. Objective  Right Upper Back: No sign recurrence  Objective  Right Breast: Full body skin examination- no atypical moles or non mole skin cancers  Objective  Left Forearm - Anterior, Right Forearm - Anterior: Multiple mostly small hornlike pink crusts    All sun exposed areas plus back examined.   Assessment & Plan    History of squamous cell carcinoma of skin Right Upper Back  Yearly skin check  Encounter for screening for malignant neoplasm of skin Right Breast  Encourage self examination twice annually and continued ultraviolet protection  AK (actinic keratosis) (2) Left Forearm - Anterior; Right Forearm - Anterior  Can choose to do 28 applications of the [place] now or wait until next fall.  This can be applied nightly or, after much irritation, every other night.  I prefer not using this in the summer.  Fluorouracil (TOLAK) 4 % CREA - Left Forearm - Anterior, Right Forearm - Anterior      I, Lavonna Monarch, MD, have reviewed all documentation for this visit.  The documentation on 11/25/20 for the exam, diagnosis, procedures, and orders are all accurate and complete.

## 2020-12-10 ENCOUNTER — Other Ambulatory Visit: Payer: Self-pay | Admitting: Cardiovascular Disease

## 2020-12-14 ENCOUNTER — Other Ambulatory Visit: Payer: Self-pay | Admitting: Cardiovascular Disease

## 2020-12-31 ENCOUNTER — Encounter: Payer: Self-pay | Admitting: Cardiovascular Disease

## 2020-12-31 NOTE — Progress Notes (Signed)
[name] Date of Birth  1961/10/05 [place] [address]    [place]   [place], [place]  [address]    [place], [place]  [address] [phone]  Fax  [phone]  [phone]  Fax [phone]  Problem list: 1.  Coronary artery disease-status post CABG - Sept. 2008 2.  Morbid obesity-status post 140 pound weight loss 3. Hyperlipidemia 4. Hypertension     60 year old gentleman with a history of coronary artery disease. He has a history of obesity but has lost quite a bit of weight.  He's done well since I last saw him. He's not had any episodes of chest pain or shortness of breath.  He is exercising regularly.  April 05, 2013:  [name] is doing OK.  He broke his R hip ( fell of the roof if his house)  He has recovered and is doing well.   He is back to exercising without problems.    May 30, 2014:  Doing well.    Oct. 6, 2016:  Doing well . Exercising regularly .  Lipids look great   Oct. 6, 2017:  Is doing ok.  Has slowed down some  exercising well BP is a bit higher Difficult to do his cardio since breaking his hip 4 years ago  Had his CMET and lipids several days go - levels are great   Feb 13, 2017:  [name] is doing ok  BP is still a bit elevated.  Is still eating salt  Is not exercising as much.   Broke his hip 3-4 years ago  ( fell through the roof)   Sept. 13, 2019: [name] is seen today for follow-up of his coronary artery disease and hypertension.  He also has a history of hyperlipidemia. Works out at the [employer]  Has gained back some of his weight .   December 08, 2019:  Doing ok. No cp.   Is not exercsing as much.    Diet is going ok Still eating a moderately low carb diet.   He brought his lab work from [place].  His total cholesterol is 114.  The HDL is 46.  The triglyceride level is 51.  LDL is 58. Glucose is 91.  Creatinine is 1.2.  Sodium is 140.  Potassium is 4.8.  Liver  enzymes are normal.  January 01, 2021: [name] is doing well Hx of CAD No CP , Wt is 210 lbs  Labs from [facility], Chol = 133 HDL = 64  LDL = 54 Trigs = 74   Current Outpatient Medications  Medication Sig Dispense Refill  . acetaminophen (TYLENOL) 500 MG tablet Take 500 mg by mouth every 6 (six) hours as needed (pain).     Marland Kitchen aspirin 81 MG tablet Take 1 tablet (81 mg total) by mouth daily.    Marland Kitchen atorvastatin (LIPITOR) 20 MG tablet TAKE 1 TABLET BY MOUTH DAILY AT 6 PM.INSURANCE ALLOWS 30 DAY 30 tablet 1  . cetirizine (ZYRTEC) 10 MG tablet Take 10 mg by mouth daily.    Marland Kitchen losartan (COZAAR) 25 MG tablet Take 2 tablets (50 mg total) by mouth daily. Overdue for yearly appointment.  Will need appointment for further refills.  Attempt x 1 180 tablet 0  . nitroGLYCERIN (NITROSTAT) 0.4 MG SL tablet Place 1 tablet (0.4 mg total) under the tongue every 5 (five) minutes as needed for chest pain. 25 tablet 3   No  current facility-administered medications for this visit.     Allergies  Allergen Reactions  . Cefaclor Rash    Past Medical History:  Diagnosis Date  . Allergy   . Asthma   . CAD (coronary artery disease)   . Hyperlipidemia   . Hypertension   . Left inguinal hernia   . Postsurgical aortocoronary bypass status   . Right inguinal hernia   . Sleep apnea   . Squamous cell carcinoma of skin 02/26/1999   bowens- right shoulder bak, inf (CX35FU)  . Squamous cell carcinoma of skin 02/26/1999   right shoulder bak, sup (CX35FU)    Past Surgical History:  Procedure Laterality Date  . double coronary artery bypass    . FRACTURE SURGERY    . HERNIA REPAIR     left and right inguinal  . ORIF ACETABULAR FRACTURE  09/08/2012   Procedure: OPEN REDUCTION INTERNAL FIXATION (ORIF) ACETABULAR FRACTURE;  Surgeon: Rozanna Box, MD;  Location: [facility];  Service: Orthopedics;  Laterality: Right;    Social History   Tobacco Use  Smoking Status Never Smoker  Smokeless Tobacco Never Used     Social History   Substance and Sexual Activity  Alcohol Use No    Family History  Problem Relation Age of Onset  . Hypertension Mother   . Hyperlipidemia Mother   . Hypertension Father   . Hyperlipidemia Father     Reviw of Systems:  Reviewed in the HPI.  All other systems are negative.  Physical Exam: Blood pressure 140/84, pulse (!) 50, height 5\' 9"  (1.753 m), weight 210 lb (95.3 kg), SpO2 98 %.  GEN:  Middle age man, NAD   HEENT: Normal NECK: No JVD; No carotid bruits LYMPHATICS: No lymphadenopathy CARDIAC: RRR , no murmurs, rubs, gallops RESPIRATORY:  Clear to auscultation without rales, wheezing or rhonchi  ABDOMEN: Soft, non-tender, non-distended MUSCULOSKELETAL:  No edema; No deformity  SKIN: Warm and dry NEUROLOGIC:  Alert and oriented x 3    ECG:    January 01, 2021:   Sinus brady at 50.  No ST or T wave   Assessment / Plan:   1.  Coronary artery disease-status post CABG - Sept. 2008 -     stable Refill NTG   2.  Morbid obesity-     Has stabilized   3. Hyperlipidemia-  Lipids look good from [facility]   4. Hypertension-BP looks stable at home , typically 115 / 75 Cont meds.   Encourage him to continue to exercise, increase exercise as much as he can    [name], MD  01/01/2021 3:52 PM    [facility] [place],  [name], [place]  [address] Pager [phone] Phone: [phone]; Fax: [phone]

## 2021-01-01 ENCOUNTER — Other Ambulatory Visit: Payer: Self-pay

## 2021-01-01 ENCOUNTER — Encounter: Payer: Self-pay | Admitting: Cardiovascular Disease

## 2021-01-01 ENCOUNTER — Ambulatory Visit (INDEPENDENT_AMBULATORY_CARE_PROVIDER_SITE_OTHER): Payer: 59 | Admitting: Cardiovascular Disease

## 2021-01-01 VITALS — BP 140/84 | HR 50 | Ht 69.0 in | Wt 210.0 lb

## 2021-01-01 DIAGNOSIS — I2510 Atherosclerotic heart disease of native coronary artery without angina pectoris: Secondary | ICD-10-CM

## 2021-01-01 DIAGNOSIS — I10 Essential (primary) hypertension: Secondary | ICD-10-CM

## 2021-01-01 MED ORDER — LOSARTAN POTASSIUM 25 MG PO TABS: 50.0000 mg | ORAL_TABLET | Freq: Every day | ORAL | 3 refills | Status: DC

## 2021-01-01 MED ORDER — NITROGLYCERIN 0.4 MG SL SUBL: 0.4000 mg | SUBLINGUAL_TABLET | SUBLINGUAL | 3 refills | Status: DC | PRN

## 2021-01-01 MED ORDER — ATORVASTATIN CALCIUM 20 MG PO TABS: 20.0000 mg | ORAL_TABLET | Freq: Every day | ORAL | 3 refills | Status: DC

## 2021-01-01 NOTE — Patient Instructions (Signed)
Medication Instructions:  Your physician recommends that you continue on your current medications as directed. Please refer to the Current Medication list given to you today.  *If you need a refill on your cardiac medications before your next appointment, please call your pharmacy*   Lab Work: none If you have labs (blood work) drawn today and your tests are completely normal, you will receive your results only by: Marland Kitchen MyChart Message (if you have MyChart) OR . A paper copy in the mail If you have any lab test that is abnormal or we need to change your treatment, we will call you to review the results.   Testing/Procedures: none   Follow-Up: At [facility], you and your health needs are our priority.  As part of our continuing mission to provide you with exceptional heart care, we have created designated Provider Care Teams.  These Care Teams include your primary Cardiologist (physician) and Advanced Practice Providers (APPs -  Physician Assistants and Nurse Practitioners) who all work together to provide you with the care you need, when you need it.    Your next appointment:   1 year(s)  The format for your next appointment:   In Person  Provider:   [name], MD

## 2021-11-13 ENCOUNTER — Ambulatory Visit: Payer: 59 | Admitting: Dermatology

## 2022-01-15 ENCOUNTER — Ambulatory Visit (INDEPENDENT_AMBULATORY_CARE_PROVIDER_SITE_OTHER): Payer: 59 | Admitting: Dermatology

## 2022-01-15 ENCOUNTER — Encounter: Payer: Self-pay | Admitting: Dermatology

## 2022-01-15 DIAGNOSIS — Z85828 Personal history of other malignant neoplasm of skin: Secondary | ICD-10-CM | POA: Diagnosis not present

## 2022-01-15 DIAGNOSIS — Z8589 Personal history of malignant neoplasm of other organs and systems: Secondary | ICD-10-CM

## 2022-01-15 DIAGNOSIS — Z1283 Encounter for screening for malignant neoplasm of skin: Secondary | ICD-10-CM | POA: Diagnosis not present

## 2022-01-15 DIAGNOSIS — L570 Actinic keratosis: Secondary | ICD-10-CM

## 2022-01-15 DIAGNOSIS — D1801 Hemangioma of skin and subcutaneous tissue: Secondary | ICD-10-CM | POA: Diagnosis not present

## 2022-01-15 NOTE — Patient Instructions (Addendum)
Seborrheic Keratosis A seborrheic keratosis is a common, noncancerous (benign) skin growth. These growths are velvety, waxy, rough, tan, brown, or black spots that appear on the skin. These skin growths can be flat or raised, andscaly. What are the causes? The cause of this condition is not known. What increases the risk? You are more likely to develop this condition if you: Have a family history of seborrheic keratosis. Are 50 or older. Are pregnant. Have had estrogen replacement therapy. What are the signs or symptoms? Symptoms of this condition include growths on the face, chest, shoulders, back, or other areas. These growths: Are usually painless, but may become irritated and itchy. Can be yellow, brown, black, or other colors. Are slightly raised or have a flat surface. Are sometimes rough or wart-like in texture. Are often velvety or waxy on the surface. Are round or oval-shaped. Often occur in groups, but may occur as a single growth. How is this diagnosed? This condition is diagnosed with a medical history and physical exam. A sample of the growth may be tested (skin biopsy). You may need to see a skin specialist (dermatologist). How is this treated? Treatment is not usually needed for this condition, unless the growths are irritated or bleed often. You may also choose to have the growths removed if you do not like their appearance. Most commonly, these growths are treated with a procedure in which liquid nitrogen is applied to "freeze" off the growth (cryosurgery). They may also be burned off with electricity (electrocautery) or removed by scraping (curettage). Follow these instructions at home: Watch your growth for any changes. Keep all follow-up visits as told by your health care provider. This is important. Do not scratch or pick at the growth or growths. This can cause them to become irritated or infected. Contact a health care provider if: You suddenly have many new  growths. Your growth bleeds, itches, or hurts. Your growth suddenly becomes larger or changes color. Summary A seborrheic keratosis is a common, noncancerous (benign) skin growth. Treatment is not usually needed for this condition, unless the growths are irritated or bleed often. Watch your growth for any changes. Contact a health care provider if you suddenly have many new growths or your growth suddenly becomes larger or changes color. Keep all follow-up visits as told by your health care provider. This is important. This information is not intended to replace advice given to you by your health care provider. Make sure you discuss any questions you have with your healthcare provider. Document Revised: 02/05/2018 Document Reviewed: 02/05/2018 Elsevier Patient Education  2022 [employer]  

## 2022-01-18 ENCOUNTER — Ambulatory Visit (INDEPENDENT_AMBULATORY_CARE_PROVIDER_SITE_OTHER): Payer: 59 | Admitting: Cardiovascular Disease

## 2022-01-18 ENCOUNTER — Encounter: Payer: Self-pay | Admitting: Cardiovascular Disease

## 2022-01-18 VITALS — BP 132/86 | HR 60 | Ht 69.0 in | Wt 210.8 lb

## 2022-01-18 DIAGNOSIS — I2510 Atherosclerotic heart disease of native coronary artery without angina pectoris: Secondary | ICD-10-CM | POA: Diagnosis not present

## 2022-01-18 DIAGNOSIS — E782 Mixed hyperlipidemia: Secondary | ICD-10-CM | POA: Diagnosis not present

## 2022-01-18 LAB — BASIC METABOLIC PANEL
BUN/Creatinine Ratio: 14 (ref 10–24)
BUN: 15 mg/dL (ref 8–27)
CO2: 25 mmol/L (ref 20–29)
Calcium: 8.7 mg/dL (ref 8.6–10.2)
Chloride: 103 mmol/L (ref 96–106)
Creatinine, Ser: 1.1 mg/dL (ref 0.76–1.27)
Glucose: 88 mg/dL (ref 70–99)
Potassium: 3.9 mmol/L (ref 3.5–5.2)
Sodium: 139 mmol/L (ref 134–144)
eGFR: 76 mL/min/{1.73_m2} (ref >59)

## 2022-01-18 LAB — LIPID PANEL
Chol/HDL Ratio: 2.2 ratio (ref 0.0–5.0)
Cholesterol, Total: 125 mg/dL (ref 100–199)
HDL: 57 mg/dL (ref >39)
LDL Chol Calc (NIH): 56 mg/dL (ref 0–99)
Triglycerides: 56 mg/dL (ref 0–149)
VLDL Cholesterol Cal: 12 mg/dL (ref 5–40)

## 2022-01-18 LAB — ALT: ALT: 20 IU/L (ref 0–44)

## 2022-01-18 NOTE — Progress Notes (Signed)
? ? ?[name] ?Date of Birth  04/16/1961 ?Press photographer     [employer] ? ?[address] ?[place], [place]  [address]    [place], [place]  [address] ?[phone]  Fax  [phone]  [phone]  Fax [phone] ? ?Problem list: ?1.  Coronary artery disease-status post CABG - Sept. 2008 ?2.  Morbid obesity-status post 140 pound weight loss ?3. Hyperlipidemia ?4. Hypertension ? ?  ? ?61 year old gentleman with a history of coronary artery disease. He has a history of obesity but has lost quite a bit of weight. ? ?He's done well since I last saw him. He's not had any episodes of chest pain or shortness of breath.  He is exercising regularly. ? ?April 05, 2013: ? ?[name] is doing OK.  He broke his R hip ( fell of the roof if his house)  He has recovered and is doing well.   He is back to exercising without problems.   ? ?May 30, 2014: ? ?Doing well.   ? ?Oct. 6, 2016: ? ?Doing well . Exercising regularly .  Lipids look great  ? ?Oct. 6, 2017: ? ?Is doing ok.  Has slowed down some  ?exercising well ?BP is a bit higher ?Difficult to do his cardio since breaking his hip 4 years ago  ?Had his CMET and lipids several days go - levels are great  ? ?Feb 13, 2017: ? ?[name] is doing ok  ?BP is still a bit elevated.  ?Is still eating salt  ?Is not exercising as much.   Broke his hip 3-4 years ago  ( fell through the roof)  ? ?Sept. 13, 2019: ?[name] is seen today for follow-up of his coronary artery disease and hypertension.  He also has a history of hyperlipidemia. ?Works out at the [employer]  ?Has gained back some of his weight .  ? ?December 08, 2019: ? ?Doing ok. ?No cp.   Is not exercsing as much.    ?Diet is going ok ?Still eating a moderately low carb diet.   ?He brought his lab work from [place].  His total cholesterol is 114.  The HDL is 46.  The triglyceride level is 51.  LDL is 58. ?Glucose is 91.  Creatinine is 1.2.  Sodium is 140.  Potassium is 4.8.  Liver  enzymes are normal. ? ?January 01, 2021: ?[name] is doing well ?Hx of CAD ?No CP , ?Wt is 210 lbs  ?Labs from [place], ?Chol = 133 ?HDL = 64  ?LDL = 54 ?Trigs = 74 ? ? ? ?January 18, 2022 ? ?[name] is doing well ?Is working out at [employer]  ? ? ?Current Outpatient Medications  ?Medication Sig Dispense Refill  ? acetaminophen (TYLENOL) 500 MG tablet Take 500 mg by mouth every 6 (six) hours as needed (pain).     ? aspirin 81 MG tablet Take 1 tablet (81 mg total) by mouth daily.    ? atorvastatin (LIPITOR) 20 MG tablet TAKE 1 TABLET BY MOUTH DAILY AT 6 PM.INSURANCE ALLOWS 30 DAY 30 tablet 1  ? cetirizine (ZYRTEC) 10 MG tablet Take 10 mg by mouth daily.    ? losartan (COZAAR) 25 MG tablet Take 2 tablets (50 mg total) by mouth daily. Overdue for yearly appointment.  Will need appointment for further refills.  Attempt x 1 180 tablet 0  ? nitroGLYCERIN (NITROSTAT) 0.4 MG SL tablet Place 1 tablet (0.4 mg total) under  the tongue every 5 (five) minutes as needed for chest pain. 25 tablet 3  ? ?No current facility-administered medications for this visit.  ? ? ? ?No Active Allergies ? ? ?Past Medical History:  ?Diagnosis Date  ? Allergy   ? Asthma   ? CAD (coronary artery disease)   ? Hyperlipidemia   ? Hypertension   ? Left inguinal hernia   ? Postsurgical aortocoronary bypass status   ? Right inguinal hernia   ? Sleep apnea   ? Squamous cell carcinoma of skin 02/26/1999  ? bowens- right shoulder bak, inf (CX35FU)  ? Squamous cell carcinoma of skin 02/26/1999  ? right shoulder bak, sup (CX35FU)  ? ? ?Past Surgical History:  ?Procedure Laterality Date  ? double coronary artery bypass    ? FRACTURE SURGERY    ? HERNIA REPAIR    ? left and right inguinal  ? ORIF ACETABULAR FRACTURE  09/08/2012  ? Procedure: OPEN REDUCTION INTERNAL FIXATION (ORIF) ACETABULAR FRACTURE;  Surgeon: Rozanna Box, MD;  Location: [facility];  Service: Orthopedics;  Laterality: Right;  ? ? ?Social History  ? ?Tobacco Use  ?Smoking Status Never  ?Smokeless Tobacco  Never  ? ? ?Social History  ? ?Substance and Sexual Activity  ?Alcohol Use No  ? ? ?Family History  ?Problem Relation Age of Onset  ? Hypertension Mother   ? Hyperlipidemia Mother   ? Hypertension Father   ? Hyperlipidemia Father   ? ? ?Reviw of Systems:  ?Reviewed in the HPI.  All other systems are negative. ? ?Physical Exam: ?Blood pressure 132/86, pulse 60, height '5\' 9"'$  (1.753 m), weight 210 lb 12.8 oz (95.6 kg), SpO2 97 %. ? ?GEN:  Well nourished, well developed in no acute distress ?HEENT: Normal ?NECK: No JVD; No carotid bruits ?LYMPHATICS: No lymphadenopathy ?CARDIAC: RRR , soft systolic murmur  ? ?RESPIRATORY:  Clear to auscultation without rales, wheezing or rhonchi  ?ABDOMEN: Soft, non-tender, non-distended ?MUSCULOSKELETAL:  No edema; No deformity  ?SKIN: Warm and dry ?NEUROLOGIC:  Alert and oriented x 3 ? ? ?ECG:    January 18, 2022  ?NSR at 60 .  No ST or T wave changes.  ?  ? ?Assessment / Plan:  ? ?1.  Coronary artery disease-status post CABG - Sept. 2008 -   ?No angina . ?He is exercising regularly.  He is not having any symptoms with exercise.  I encouraged him to continue to exercise on a regular basis. ? ? ?2.  Morbid obesity-      ? ?3. Hyperlipidemia-     check labs today  ? ?4. Hypertension-blood pressure is well controlled.  Continue current medications. ? ?5.  Systolic murmur:   we may get an echo at some point in the future.  His murmur sounds fairly benign .  ?He anticipates changing insurance next year.  He may want to get an echocardiogram prior to losing his current insurance in June.  He will give [place] a call if he wants to go and get the echocardiogram or weight.  Told him that I do not think that it is an emergency that he necessarily get an echo yet. ? ?[name], MD  ?01/18/2022 8:00 AM    ?[facility] ?[address],  [address] ?[place], [place]  [address] ?Pager [phone] ?Phone: [phone]; Fax: [phone]  ? ? ? ? ?

## 2022-01-18 NOTE — Patient Instructions (Signed)
Medication Instructions:  ?Your physician recommends that you continue on your current medications as directed. Please refer to the Current Medication list given to you today. ? ?*If you need a refill on your cardiac medications before your next appointment, please call your pharmacy* ? ?Lab Work: ?TODAY: Lipids, ALT, BMP ?If you have labs (blood work) drawn today and your tests are completely normal, you will receive your results only by: ?MyChart Message (if you have MyChart) OR ?A paper copy in the mail ?If you have any lab test that is abnormal or we need to change your treatment, we will call you to review the results. ? ?Testing/Procedures: ?NONE ? ?Follow-Up: ?At [facility], you and your health needs are our priority.  As part of our continuing mission to provide you with exceptional heart care, we have created designated Provider Care Teams.  These Care Teams include your primary Cardiologist (physician) and Advanced Practice Providers (APPs -  Physician Assistants and Nurse Practitioners) who all work together to provide you with the care you need, when you need it. ? ?Your next appointment:   ?1 year(s) ? ?The format for your next appointment:   ?In Person ? ?Provider:   ?[name], MD  or Robbie Lis, PA-C, Christen Bame, NP, or Richardson Dopp, PA-C      ? ?Other Instructions ? ?Important Information About Sugar ? ? ? ? ?  ?

## 2022-01-22 ENCOUNTER — Encounter: Payer: Self-pay | Admitting: Cardiovascular Disease

## 2022-02-02 ENCOUNTER — Encounter: Payer: Self-pay | Admitting: Dermatology

## 2022-02-02 NOTE — Progress Notes (Signed)
? ?  Follow-Up Visit ?  ?Subjective  ?[name] is a 61 y.o. male who presents for the following: Annual Exam (Annual exam. Lesion left forehead x years. Personal history of scc. ). ? ?General skin check, crust over eyebrow ?Location:  ?Duration:  ?Quality:  ?Associated Signs/Symptoms: ?Modifying Factors:  ?Severity:  ?Timing: ?Context:  ? ?Objective  ?Well appearing patient in no apparent distress; mood and affect are within normal limits. ?Annual skin examination: No atypical pigmented lesions (all checked with dermoscopy), nonmelanoma skin cancer. ? ?Left Abdomen (side) - Upper, Left Upper Back ?Multiple 1 mm smooth red dermal papules ? ?Right Upper Back ?Scar clear ? ?Left Forehead ?8 mm gritty pink crust ? ? ? ?A full examination was performed including scalp, head, eyes, ears, nose, lips, neck, chest, axillae, abdomen, back, buttocks, bilateral upper extremities, bilateral lower extremities, hands, feet, fingers, toes, fingernails, and toenails. All findings within normal limits unless otherwise noted below. ? ? ?Assessment & Plan  ? ? ?Encounter for screening for malignant neoplasm of skin ? ?Annual skin examination, encouraged to self examine with spouse twice annually.  Continued ultraviolet protection. ? ?Cherry angioma (2) ?Left Abdomen (side) - Upper; Left Upper Back ? ?No intervention necessary ? ?History of squamous cell carcinoma ?Right Upper Back ? ?Check.  As needed change ? ?AK (actinic keratosis) ?Left Forehead ? ?Will return for biopsy if freezing fails ? ?Destruction of lesion - Left Forehead ?Complexity: simple   ?Destruction method: cryotherapy   ?Informed consent: discussed and consent obtained   ?Timeout:  patient name, date of birth, surgical site, and procedure verified ?Lesion destroyed using liquid nitrogen: Yes   ?Cryotherapy cycles:  3 ?Outcome: patient tolerated procedure well with no complications   ?Post-procedure details: wound care instructions given   ? ?Related  Medications ?Fluorouracil (TOLAK) 4 % CREA ?Apply to affected area qhs x 51-76 applications ? ? ? ? ? ?I, Lavonna Monarch, MD, have reviewed all documentation for this visit.  The documentation on 02/02/22 for the exam, diagnosis, procedures, and orders are all accurate and complete. ?

## 2022-03-01 ENCOUNTER — Other Ambulatory Visit: Payer: Self-pay | Admitting: Cardiovascular Disease

## 2022-04-12 ENCOUNTER — Other Ambulatory Visit: Payer: Self-pay | Admitting: Cardiovascular Disease

## 2023-01-12 ENCOUNTER — Other Ambulatory Visit: Payer: Self-pay | Admitting: Cardiovascular Disease

## 2023-01-20 ENCOUNTER — Ambulatory Visit: Payer: 59 | Admitting: Dermatology

## 2023-01-20 ENCOUNTER — Telehealth: Payer: Self-pay

## 2023-01-20 NOTE — Telephone Encounter (Signed)
   Name: [name]  DOB: Jan 11, 1961  MRN: [id number]  Primary Cardiologist: Kristeen Miss, MD  Chart reviewed as part of pre-operative protocol coverage. Because of [name]'s past medical history and time since last visit, he will require a follow-up in-office visit in order to better assess preoperative cardiovascular risk.  Pre-op covering staff: - Please schedule appointment and call patient to inform them. If patient already had an upcoming appointment within acceptable timeframe, please add "pre-op clearance" to the appointment notes so provider is aware. - Please contact requesting surgeon's office via preferred method (i.e, phone, fax) to inform them of need for appointment prior to surgery.  Aspirin hold can be addressed at the time of his in office appointment.   Sharlene Dory, PA-C  01/20/2023, 3:46 PM

## 2023-01-20 NOTE — Telephone Encounter (Signed)
   Pre-operative Risk Assessment    Patient Name: [name]  DOB: 23-Aug-1961 MRN: [id number]      Request for Surgical Clearance    Procedure:   RIGHT KNEE SCOPE, SYNOVECTOMY  Date of Surgery:  Clearance 03/17/23                                 Surgeon:  Dr. Ramond Marrow Surgeon's Group or Practice Name:  [facility]  Phone number:  [phone] attn: Silvestre Mesi Fax number:  [phone]   Type of Clearance Requested:   - Medical  - Pharmacy:  Hold Aspirin needs instructions when to hold     Type of Anesthesia:  General    Additional requests/questions:    SignedMichaelle Copas   01/20/2023, 1:00 PM

## 2023-01-20 NOTE — Telephone Encounter (Signed)
Pt has an appointment 03/07/23, clearance will be addressed at that time.  Will route to requesting surgeon's office to make them aware.

## 2023-02-05 ENCOUNTER — Other Ambulatory Visit: Payer: Self-pay | Admitting: Cardiovascular Disease

## 2023-02-07 ENCOUNTER — Other Ambulatory Visit: Payer: Self-pay | Admitting: Cardiovascular Disease

## 2023-02-20 LAB — LAB REPORT - SCANNED: EGFR: 73

## 2023-03-06 NOTE — Progress Notes (Signed)
Cardiology Office Note:    Date:  03/07/2023   ID:  [name], DOB 1961/03/28, MRN [id number]  PCP:  Jarrett Soho, PA-C   [facility] Providers Cardiologist:  Kristeen Miss, MD     Referring MD: Jarrett Soho, PA-C   Chief Complaint: preoperative cardiac evaluation  History of Present Illness:    [name] is a very pleasant 62 y.o. male with a hx of CAD s/p CABG 2008, morbid obesity, HLD, and HTN.   He established with cardiology following admission in 2008 where he was found to have 100% occlusion proximal LAD.  It was made to stent but wire could not cross the lesion and he underwent CABG on 07/02/2007 with LIMA to LAD and SVG to diagonal. Following surgery he lost > 100 lb.   He has not had any recent cardiac testing.   He has maintained consistent follow-up and was last seen in cardiology clinic by Dr. Elease Hashimoto on 01/18/22 at which time he reported he was exercising consistently at [facility].  Noted to have systolic murmur on exam which sounded fairly benign per Dr. Elease Hashimoto.  He was encouraged to get an echocardiogram but elected to wait due to insurance concerns.  Today, he is here for preoperative cardiac evaluation for upcoming knee surgery. Has been feeling well, no specific concerns. Goes to the gym 3-4 days per week. Was running for awhile then stopped. When he resumed running, he tore meniscus in right knee. He continues to lift weights. Home BP 135 highest over 80s. Recalls diagnosis of heart murmur as a child. Reports chest pain present since bypass surgery in 2008 that is felt to be musculoskeletal in nature. He denies shortness of breath, lower extremity edema, fatigue, palpitations, melena, hematuria, hemoptysis, diaphoresis, weakness, presyncope, syncope, orthopnea, and PND. He asked if he has a stent placed prior to bypass, recalls that there was difficulty getting good perfusion. I was able to review the records with him and advise no stent.   Past  Medical History:  Diagnosis Date   Allergy    Asthma    CAD (coronary artery disease)    Hyperlipidemia    Hypertension    Left inguinal hernia    Postsurgical aortocoronary bypass status    Right inguinal hernia    Sleep apnea    Squamous cell carcinoma of skin 02/26/1999   bowens- right shoulder bak, inf (CX35FU)   Squamous cell carcinoma of skin 02/26/1999   right shoulder bak, sup (CX35FU)    Past Surgical History:  Procedure Laterality Date   double coronary artery bypass     FRACTURE SURGERY     HERNIA REPAIR     left and right inguinal   ORIF ACETABULAR FRACTURE  09/08/2012   Procedure: OPEN REDUCTION INTERNAL FIXATION (ORIF) ACETABULAR FRACTURE;  Surgeon: Budd Palmer, MD;  [facility];  Service: Orthopedics;  Laterality: Right;    Current Medications: Current Meds  Medication Sig   acetaminophen (TYLENOL) 500 MG tablet Take 500 mg by mouth every 6 (six) hours as needed (pain).    aspirin 81 MG tablet Take 1 tablet (81 mg total) by mouth daily.   atorvastatin (LIPITOR) 20 MG tablet TAKE 1 TABLET BY MOUTH EVERY DAY   cetirizine (ZYRTEC) 10 MG tablet Take 10 mg by mouth daily.   losartan (COZAAR) 25 MG tablet TAKE 2 TABLETS BY MOUTH EVERY DAY   montelukast (SINGULAIR) 10 MG tablet Take 10 mg by mouth daily.   nitroGLYCERIN (NITROSTAT)  0.4 MG SL tablet Place 1 tablet (0.4 mg total) under the tongue every 5 (five) minutes as needed for chest pain.   [DISCONTINUED] Fluorouracil (TOLAK) 4 % CREA Apply to affected area qhs x 25-28 applications     Allergies:   Patient has no active allergies.   Social History   Socioeconomic History   Marital status: Married    Spouse name: Not on file   Number of children: Not on file   Years of education: Not on file   Highest education level: Not on file  Occupational History   Not on file  Tobacco Use   Smoking status: Never   Smokeless tobacco: Never  Vaping Use   Vaping Use: Never used  Substance and Sexual  Activity   Alcohol use: No   Drug use: No   Sexual activity: Not on file  Other Topics Concern   Not on file  Social History Narrative   Not on file   Social Determinants of Health   Financial Resource Strain: Not on file  Food Insecurity: Not on file  Transportation Needs: Not on file  Physical Activity: Not on file  Stress: Not on file  Social Connections: Not on file     Family History: The patient's family history includes Hyperlipidemia in his father and mother; Hypertension in his father and mother.  ROS:   Please see the history of present illness.   All other systems reviewed and are negative.  Labs/Other Studies Reviewed:    The following studies were reviewed today:   Recent Labs: SCr 1.13, GFR 73, sodium 140, K+ 4.7 ALT 22, AST 20 Recent Lipid Panel LDL 46 HDL 54 Triglycerides 53 Total cholesterol 112   Risk Assessment/Calculations:           Physical Exam:    VS:  BP 134/80   Pulse (!) 50   Ht 5\' 9"  (1.753 m)   Wt 208 lb (94.3 kg)   SpO2 98%   BMI 30.72 kg/m     Wt Readings from Last 3 Encounters:  03/07/23 208 lb (94.3 kg)  01/18/22 210 lb 12.8 oz (95.6 kg)  01/01/21 210 lb (95.3 kg)     GEN:  Well nourished, well developed in no acute distress HEENT: Normal NECK: No JVD; No carotid bruits CARDIAC: RRR, no murmurs, rubs, gallops RESPIRATORY:  Clear to auscultation without rales, wheezing or rhonchi  ABDOMEN: Soft, non-tender, non-distended MUSCULOSKELETAL:  No edema; No deformity. 2+ pedal pulses, equal bilaterally SKIN: Warm and dry NEUROLOGIC:  Alert and oriented x 3 PSYCHIATRIC:  Normal affect   EKG:  EKG is ordered today.  The ekg ordered today demonstrates sinus bradycardia at 50 bpm, no ST abnormality      Diagnoses:    1. Preoperative cardiovascular examination   2. Coronary artery disease involving native coronary artery of native heart without angina pectoris   3. Hyperlipidemia, unspecified hyperlipidemia type    4. Class 1 obesity with serious comorbidity and body mass index (BMI) of 30.0 to 30.9 in adult, unspecified obesity type   5. Sinus bradycardia   6. S/P CABG x 2    Assessment and Plan:     Preoperative cardiac evaluation: According to the Revised Cardiac Risk Index (RCRI), his Perioperative Risk of Major Cardiac Event is (%): 0.9. His Functional Capacity in METs is: 8.23 according to the Duke Activity Status Index (DASI). The patient is doing well from a cardiac perspective. Therefore, based on ACC/AHA guidelines, the patient would  be at acceptable risk for the planned procedure without further cardiovascular testing. He may hold aspirin for 1 week prior to surgery if indicated and should resume as soon as possible postoperative.   CAD without angina: S/p CABG x 2 2008 with LIMA-LAD and SVG-diagonal. We reviewed his cath report from 2008. He denies chest pain, dyspnea, or other symptoms concerning for angina.  No indication for further ischemic evaluation at this time. Lengthy discussion about diet and secondary prevention. Continue regular moderate intensity exercise and heart healthy diet. LDL is well controlled.   Hyperlipidemia LDL goal < 55: Labs from primary care. LDL 46, Triggs 53 on 02/20/2023. At goal. Continue atorvastatin.   Hypertension: BP is well controlled.  Renal function is stable on recent labs.   Sinus bradycardia: HR is 50 on EKG today. This has been present for many years. He is asymptomatic. Feels that HR increases appropriately with exercise.   Obesity: Lengthy discussion about plant based diet. He eats pretty well but admits to eating more simple carbohydrates and nighttime snacking. By his BMI, he is in obese category, however overall his weight is well distributed and he has good muscle tone. Advised no way for [place] to measure visceral fat. Offered Dr. Riley Kill book, Prevent and Reverse Heart Disease, as a potential resource.      Disposition: 1 year with Dr. Elease Hashimoto  or me  Medication Adjustments/Labs and Tests Ordered: Current medicines are reviewed at length with the patient today.  Concerns regarding medicines are outlined above.  Orders Placed This Encounter  Procedures   EKG 12-Lead   No orders of the defined types were placed in this encounter.   Patient Instructions  Medication Instructions:   Your physician recommends that you continue on your current medications as directed. Please refer to the Current Medication list given to you today.   *If you need a refill on your cardiac medications before your next appointment, please call your pharmacy*   Lab Work:  None ordered.  If you have labs (blood work) drawn today and your tests are completely normal, you will receive your results only by: MyChart Message (if you have MyChart) OR A paper copy in the mail If you have any lab test that is abnormal or we need to change your treatment, we will call you to review the results.   Testing/Procedures:  None ordered.   Follow-Up: At [facility], you and your health needs are our priority.  As part of our continuing mission to provide you with exceptional heart care, we have created designated Provider Care Teams.  These Care Teams include your primary Cardiologist (physician) and Advanced Practice Providers (APPs -  Physician Assistants and Nurse Practitioners) who all work together to provide you with the care you need, when you need it.  We recommend signing up for the patient portal called "MyChart".  Sign up information is provided on this After Visit Summary.  MyChart is used to connect with patients for Virtual Visits (Telemedicine).  Patients are able to view lab/test results, encounter notes, upcoming appointments, etc.  Non-urgent messages can be sent to your provider as well.   To learn more about what you can do with MyChart, go to [email or web address].    Your next appointment:   1 year(s)  Provider:   Kristeen Miss, MD     Other Instructions  Your physician wants you to follow-up in: 1 year with Dr. Elease Hashimoto.  You will receive a reminder letter in the mail  two months in advance. If you don't receive a letter, please call our office to schedule the follow-up appointment.     Signed, Levi Aland, NP  03/07/2023 12:56 PM    [facility]

## 2023-03-07 ENCOUNTER — Encounter: Payer: Self-pay | Admitting: Nurse Practitioner

## 2023-03-07 ENCOUNTER — Ambulatory Visit: Payer: 59 | Attending: Nurse Practitioner | Admitting: Nurse Practitioner

## 2023-03-07 VITALS — BP 134/80 | HR 50 | Ht 69.0 in | Wt 208.0 lb

## 2023-03-07 DIAGNOSIS — Z01810 Encounter for preprocedural cardiovascular examination: Secondary | ICD-10-CM | POA: Diagnosis not present

## 2023-03-07 DIAGNOSIS — Z6830 Body mass index (BMI) 30.0-30.9, adult: Secondary | ICD-10-CM

## 2023-03-07 DIAGNOSIS — R001 Bradycardia, unspecified: Secondary | ICD-10-CM

## 2023-03-07 DIAGNOSIS — I2510 Atherosclerotic heart disease of native coronary artery without angina pectoris: Secondary | ICD-10-CM | POA: Diagnosis not present

## 2023-03-07 DIAGNOSIS — E669 Obesity, unspecified: Secondary | ICD-10-CM | POA: Diagnosis not present

## 2023-03-07 DIAGNOSIS — E785 Hyperlipidemia, unspecified: Secondary | ICD-10-CM

## 2023-03-07 DIAGNOSIS — Z951 Presence of aortocoronary bypass graft: Secondary | ICD-10-CM

## 2023-03-07 NOTE — Patient Instructions (Signed)
Medication Instructions:   Your physician recommends that you continue on your current medications as directed. Please refer to the Current Medication list given to you today.   *If you need a refill on your cardiac medications before your next appointment, please call your pharmacy*   Lab Work:  None ordered.  If you have labs (blood work) drawn today and your tests are completely normal, you will receive your results only by: MyChart Message (if you have MyChart) OR A paper copy in the mail If you have any lab test that is abnormal or we need to change your treatment, we will call you to review the results.   Testing/Procedures:  None ordered.   Follow-Up: At  [facility], you and your health needs are our priority.  As part of our continuing mission to provide you with exceptional heart care, we have created designated Provider Care Teams.  These Care Teams include your primary Cardiologist (physician) and Advanced Practice Providers (APPs -  Physician Assistants and Nurse Practitioners) who all work together to provide you with the care you need, when you need it.  We recommend signing up for the patient portal called "MyChart".  Sign up information is provided on this After Visit Summary.  MyChart is used to connect with patients for Virtual Visits (Telemedicine).  Patients are able to view lab/test results, encounter notes, upcoming appointments, etc.  Non-urgent messages can be sent to your provider as well.   To learn more about what you can do with MyChart, go to [email or web address].    Your next appointment:   1 year(s)  Provider:   Philip Nahser, MD     Other Instructions  Your physician wants you to follow-up in: 1 year with Dr. Nahser.  You will receive a reminder letter in the mail two months in advance. If you don't receive a letter, please call our office to schedule the follow-up appointment.   

## 2023-03-14 ENCOUNTER — Other Ambulatory Visit: Payer: Self-pay | Admitting: Cardiovascular Disease

## 2023-05-04 ENCOUNTER — Other Ambulatory Visit: Payer: Self-pay | Admitting: Cardiovascular Disease

## 2023-06-04 DIAGNOSIS — H9313 Tinnitus, bilateral: Secondary | ICD-10-CM | POA: Diagnosis not present

## 2023-06-04 DIAGNOSIS — H903 Sensorineural hearing loss, bilateral: Secondary | ICD-10-CM | POA: Diagnosis not present

## 2023-06-30 DIAGNOSIS — J0100 Acute maxillary sinusitis, unspecified: Secondary | ICD-10-CM | POA: Diagnosis not present

## 2023-06-30 DIAGNOSIS — R051 Acute cough: Secondary | ICD-10-CM | POA: Diagnosis not present

## 2023-06-30 DIAGNOSIS — Z6830 Body mass index (BMI) 30.0-30.9, adult: Secondary | ICD-10-CM | POA: Diagnosis not present

## 2023-09-06 ENCOUNTER — Other Ambulatory Visit: Payer: Self-pay | Admitting: Cardiovascular Disease

## 2023-10-22 DIAGNOSIS — R208 Other disturbances of skin sensation: Secondary | ICD-10-CM | POA: Diagnosis not present

## 2023-10-22 DIAGNOSIS — Z789 Other specified health status: Secondary | ICD-10-CM | POA: Diagnosis not present

## 2023-10-22 DIAGNOSIS — L538 Other specified erythematous conditions: Secondary | ICD-10-CM | POA: Diagnosis not present

## 2023-10-22 DIAGNOSIS — L2989 Other pruritus: Secondary | ICD-10-CM | POA: Diagnosis not present

## 2023-10-22 DIAGNOSIS — L820 Inflamed seborrheic keratosis: Secondary | ICD-10-CM | POA: Diagnosis not present

## 2024-01-08 DIAGNOSIS — J0100 Acute maxillary sinusitis, unspecified: Secondary | ICD-10-CM | POA: Diagnosis not present

## 2024-01-08 DIAGNOSIS — J029 Acute pharyngitis, unspecified: Secondary | ICD-10-CM | POA: Diagnosis not present

## 2024-01-08 DIAGNOSIS — Z20822 Contact with and (suspected) exposure to covid-19: Secondary | ICD-10-CM | POA: Diagnosis not present

## 2024-01-28 DIAGNOSIS — L821 Other seborrheic keratosis: Secondary | ICD-10-CM | POA: Diagnosis not present

## 2024-01-28 DIAGNOSIS — D225 Melanocytic nevi of trunk: Secondary | ICD-10-CM | POA: Diagnosis not present

## 2024-01-28 DIAGNOSIS — L578 Other skin changes due to chronic exposure to nonionizing radiation: Secondary | ICD-10-CM | POA: Diagnosis not present

## 2024-01-28 DIAGNOSIS — L570 Actinic keratosis: Secondary | ICD-10-CM | POA: Diagnosis not present

## 2024-01-28 DIAGNOSIS — L814 Other melanin hyperpigmentation: Secondary | ICD-10-CM | POA: Diagnosis not present

## 2024-02-20 DIAGNOSIS — J3089 Other allergic rhinitis: Secondary | ICD-10-CM | POA: Diagnosis not present

## 2024-02-20 DIAGNOSIS — R052 Subacute cough: Secondary | ICD-10-CM | POA: Diagnosis not present

## 2024-02-20 DIAGNOSIS — H1045 Other chronic allergic conjunctivitis: Secondary | ICD-10-CM | POA: Diagnosis not present

## 2024-02-20 DIAGNOSIS — J301 Allergic rhinitis due to pollen: Secondary | ICD-10-CM | POA: Diagnosis not present

## 2024-03-04 DIAGNOSIS — J301 Allergic rhinitis due to pollen: Secondary | ICD-10-CM | POA: Diagnosis not present

## 2024-03-04 DIAGNOSIS — J3089 Other allergic rhinitis: Secondary | ICD-10-CM | POA: Diagnosis not present

## 2024-03-05 DIAGNOSIS — J301 Allergic rhinitis due to pollen: Secondary | ICD-10-CM | POA: Diagnosis not present

## 2024-03-05 DIAGNOSIS — J3081 Allergic rhinitis due to animal (cat) (dog) hair and dander: Secondary | ICD-10-CM | POA: Diagnosis not present

## 2024-03-05 DIAGNOSIS — J3089 Other allergic rhinitis: Secondary | ICD-10-CM | POA: Diagnosis not present

## 2024-03-07 ENCOUNTER — Encounter: Payer: Self-pay | Admitting: Cardiovascular Disease

## 2024-03-07 NOTE — Progress Notes (Unsigned)
 [name] Date of Birth  Mar 20, 1961 [facility]     [place] [facility]  [address]    [address]   [address] [place], [place]  [address]    [place], [place]  [address] [phone]  Fax  [phone]  [phone]  Fax [phone]  Problem list: 1.  Coronary artery disease-status post CABG - Sept. 2008 2.  Morbid obesity-status post 140 pound weight loss 3. Hyperlipidemia 4. Hypertension     63 year old gentleman with a history of coronary artery disease. He has a history of obesity but has lost quite a bit of weight.  He's done well since I last saw him. He's not had any episodes of chest pain or shortness of breath.  He is exercising regularly.  April 05, 2013:  [name] is doing OK.  He broke his R hip ( fell of the roof if his house)  He has recovered and is doing well.   He is back to exercising without problems.    May 30, 2014:  Doing well.    Oct. 6, 2016:  Doing well . Exercising regularly .  Lipids look great   Oct. 6, 2017:  Is doing ok.  Has slowed down some  exercising well BP is a bit higher Difficult to do his cardio since breaking his hip 4 years ago  Had his CMET and lipids several days go - levels are great   Feb 13, 2017:  [name] is doing ok  BP is still a bit elevated.  Is still eating salt  Is not exercising as much.   Broke his hip 3-4 years ago  ( fell through the roof)   Sept. 13, 2019: [name] is seen today for follow-up of his coronary artery disease and hypertension.  He also has a history of hyperlipidemia. Works out at the [employer]  Has gained back some of his weight .   December 08, 2019:  Doing ok. No cp.   Is not exercsing as much.    Diet is going ok Still eating a moderately low carb diet.   He brought his lab work from [facility].  His total cholesterol is 114.  The HDL is 46.  The triglyceride level is 51.  LDL is 58. Glucose is 91.  Creatinine is 1.2.  Sodium is 140.  Potassium is 4.8.  Liver  enzymes are normal.  January 01, 2021: [name] is doing well Hx of CAD No CP , Wt is 210 lbs  Labs from [place], Chol = 133 HDL = 64  LDL = 54 Trigs = 74    January 18, 2022  [name] is doing well Is working out at [facility]   March 08, 2024 [name] is seen for follow up of his CAD, HLD, obesity  Is still relatively active   Labs were drawn this morning  BP was elevated this morning     Current Outpatient Medications  Medication Sig Dispense Refill   acetaminophen  (TYLENOL ) 500 MG tablet Take 500 mg by mouth every 6 (six) hours as needed (pain).      aspirin  81 MG tablet Take 1 tablet (81 mg total) by mouth daily.     atorvastatin  (LIPITOR) 20 MG tablet TAKE 1 TABLET BY MOUTH DAILY AT 6 PM.INSURANCE ALLOWS 30 DAY 30 tablet 1   cetirizine (ZYRTEC) 10 MG tablet Take 10 mg by mouth daily.     losartan  (COZAAR ) 25 MG tablet Take 2 tablets (50 mg total) by  mouth daily. Overdue for yearly appointment.  Will need appointment for further refills.  Attempt x 1 180 tablet 0   nitroGLYCERIN  (NITROSTAT ) 0.4 MG SL tablet Place 1 tablet (0.4 mg total) under the tongue every 5 (five) minutes as needed for chest pain. 25 tablet 3   No current facility-administered medications for this visit.     No Known Allergies   Past Medical History:  Diagnosis Date   Allergy    Asthma    CAD (coronary artery disease)    Hyperlipidemia    Hypertension    Left inguinal hernia    Postsurgical aortocoronary bypass status    Right inguinal hernia    Sleep apnea    Squamous cell carcinoma of skin 02/26/1999   bowens- right shoulder bak, inf (CX35FU)   Squamous cell carcinoma of skin 02/26/1999   right shoulder bak, sup (CX35FU)    Past Surgical History:  Procedure Laterality Date   double coronary artery bypass     FRACTURE SURGERY     HERNIA REPAIR     left and right inguinal   ORIF ACETABULAR FRACTURE  09/08/2012   Procedure: OPEN REDUCTION INTERNAL FIXATION (ORIF) ACETABULAR FRACTURE;  Surgeon:  Arlette Lagos, MD;  Location: [facility] OR;  Service: Orthopedics;  Laterality: Right;    Social History   Tobacco Use  Smoking Status Never  Smokeless Tobacco Never    Social History   Substance and Sexual Activity  Alcohol Use No    Family History  Problem Relation Age of Onset   Hypertension Mother    Hyperlipidemia Mother    Hypertension Father    Hyperlipidemia Father     Reviw of Systems:  Reviewed in the HPI.  All other systems are negative.   Physical Exam: Blood pressure 124/82, pulse (!) 52, height 5\' 9"  (1.753 m), weight 218 lb 3.2 oz (99 kg), SpO2 98%.       GEN:  Well nourished, well developed in no acute distress HEENT: Normal NECK: No JVD; No carotid bruits LYMPHATICS: No lymphadenopathy CARDIAC: RRR , no murmurs, rubs, gallops RESPIRATORY:  Clear to auscultation without rales, wheezing or rhonchi  ABDOMEN: Soft, non-tender, non-distended MUSCULOSKELETAL:  No edema; No deformity  SKIN: Warm and dry NEUROLOGIC:  Alert and oriented x 3   ECG:      EKG Interpretation Date/Time:  Monday March 08 2024 14:46:17 EDT Ventricular Rate:  52 PR Interval:  178 QRS Duration:  98 QT Interval:  424 QTC Calculation: 394 R Axis:   107  Text Interpretation: Sinus bradycardia Rightward axis Cannot rule out Anterior infarct (cited on or before 04-Mar-2018) When compared with ECG of 04-Mar-2018 06:34, Abberant conduction is no longer Present Vent. rate has decreased BY  28 BPM T wave amplitude has decreased in Inferior leads Nonspecific T wave abnormality no longer evident in Lateral leads Confirmed by Ahmad Alert ([id number]) on 03/08/2024 3:14:48 PM       Assessment / Plan:   1.  Coronary artery disease-status post CABG - Sept. 2008 -    No angina .  Lipids looked great last year.   Labs checked this am at his primary MD's office    2.  Morbid obesity-      lost 140 lbs.   Has regained some of his weight .   Advised him to aim for a wt of 190 -200 lbs.  Current wt  is 218 lbs   3. Hyperlipidemia-       4. Hypertension-  BP has been a bit elevated.  He took an extra Losartan  50 mg this am ( total of 100 mg today )  Will increase his prescribed dose to Losartan  100 mg a day , check bmp in 2-3 weeks    5.  Systolic murmur:      Ahmad Alert, MD  03/08/2024 2:56 PM    [facility] [address] [place],  [address] [place], [place]  [address] Pager [phone] Phone: [phone]; Fax: [phone]

## 2024-03-08 ENCOUNTER — Ambulatory Visit: Payer: Self-pay | Attending: Cardiovascular Disease | Admitting: Cardiovascular Disease

## 2024-03-08 ENCOUNTER — Encounter: Payer: Self-pay | Admitting: Cardiovascular Disease

## 2024-03-08 VITALS — BP 124/82 | HR 52 | Ht 69.0 in | Wt 218.2 lb

## 2024-03-08 DIAGNOSIS — Z125 Encounter for screening for malignant neoplasm of prostate: Secondary | ICD-10-CM | POA: Diagnosis not present

## 2024-03-08 DIAGNOSIS — Z79899 Other long term (current) drug therapy: Secondary | ICD-10-CM

## 2024-03-08 DIAGNOSIS — I10 Essential (primary) hypertension: Secondary | ICD-10-CM | POA: Diagnosis not present

## 2024-03-08 DIAGNOSIS — E7800 Pure hypercholesterolemia, unspecified: Secondary | ICD-10-CM | POA: Diagnosis not present

## 2024-03-08 DIAGNOSIS — J3089 Other allergic rhinitis: Secondary | ICD-10-CM | POA: Diagnosis not present

## 2024-03-08 DIAGNOSIS — J3081 Allergic rhinitis due to animal (cat) (dog) hair and dander: Secondary | ICD-10-CM | POA: Diagnosis not present

## 2024-03-08 DIAGNOSIS — I2510 Atherosclerotic heart disease of native coronary artery without angina pectoris: Secondary | ICD-10-CM | POA: Diagnosis not present

## 2024-03-08 DIAGNOSIS — J301 Allergic rhinitis due to pollen: Secondary | ICD-10-CM | POA: Diagnosis not present

## 2024-03-08 MED ORDER — LOSARTAN POTASSIUM 100 MG PO TABS: 100.0000 mg | ORAL_TABLET | Freq: Every day | ORAL | 3 refills | Status: AC

## 2024-03-08 NOTE — Patient Instructions (Signed)
 Medication Instructions:  INCREASE Losartan  100mg  daily *If you need a refill on your cardiac medications before your next appointment, please call your pharmacy*  Lab Work: BMET in 2-3 weeks If you have labs (blood work) drawn today and your tests are completely normal, you will receive your results only by: MyChart Message (if you have MyChart) OR A paper copy in the mail If you have any lab test that is abnormal or we need to change your treatment, we will call you to review the results.  Follow-Up: At [facility]: [facility], you and your health needs are our priority.  As part of our continuing mission to provide you with exceptional heart care, our providers are all part of one team.  This team includes your primary Cardiologist (physician) and Advanced Practice Providers or APPs (Physician Assistants and Nurse Practitioners) who all work together to provide you with the care you need, when you need it.  Your next appointment:   6 month(s)  Provider:   Ahmad Alert, MD Willia Harries

## 2024-03-10 DIAGNOSIS — J301 Allergic rhinitis due to pollen: Secondary | ICD-10-CM | POA: Diagnosis not present

## 2024-03-10 DIAGNOSIS — J3089 Other allergic rhinitis: Secondary | ICD-10-CM | POA: Diagnosis not present

## 2024-03-10 DIAGNOSIS — J3081 Allergic rhinitis due to animal (cat) (dog) hair and dander: Secondary | ICD-10-CM | POA: Diagnosis not present

## 2024-03-12 DIAGNOSIS — J301 Allergic rhinitis due to pollen: Secondary | ICD-10-CM | POA: Diagnosis not present

## 2024-03-12 DIAGNOSIS — J3081 Allergic rhinitis due to animal (cat) (dog) hair and dander: Secondary | ICD-10-CM | POA: Diagnosis not present

## 2024-03-12 DIAGNOSIS — J3089 Other allergic rhinitis: Secondary | ICD-10-CM | POA: Diagnosis not present

## 2024-03-15 DIAGNOSIS — J301 Allergic rhinitis due to pollen: Secondary | ICD-10-CM | POA: Diagnosis not present

## 2024-03-15 DIAGNOSIS — J3081 Allergic rhinitis due to animal (cat) (dog) hair and dander: Secondary | ICD-10-CM | POA: Diagnosis not present

## 2024-03-15 DIAGNOSIS — J3089 Other allergic rhinitis: Secondary | ICD-10-CM | POA: Diagnosis not present

## 2024-03-17 DIAGNOSIS — J3089 Other allergic rhinitis: Secondary | ICD-10-CM | POA: Diagnosis not present

## 2024-03-17 DIAGNOSIS — J301 Allergic rhinitis due to pollen: Secondary | ICD-10-CM | POA: Diagnosis not present

## 2024-03-17 DIAGNOSIS — J3081 Allergic rhinitis due to animal (cat) (dog) hair and dander: Secondary | ICD-10-CM | POA: Diagnosis not present

## 2024-03-19 DIAGNOSIS — J3089 Other allergic rhinitis: Secondary | ICD-10-CM | POA: Diagnosis not present

## 2024-03-19 DIAGNOSIS — J3081 Allergic rhinitis due to animal (cat) (dog) hair and dander: Secondary | ICD-10-CM | POA: Diagnosis not present

## 2024-03-19 DIAGNOSIS — J301 Allergic rhinitis due to pollen: Secondary | ICD-10-CM | POA: Diagnosis not present

## 2024-03-22 DIAGNOSIS — J301 Allergic rhinitis due to pollen: Secondary | ICD-10-CM | POA: Diagnosis not present

## 2024-03-22 DIAGNOSIS — J3081 Allergic rhinitis due to animal (cat) (dog) hair and dander: Secondary | ICD-10-CM | POA: Diagnosis not present

## 2024-03-22 DIAGNOSIS — J3089 Other allergic rhinitis: Secondary | ICD-10-CM | POA: Diagnosis not present

## 2024-03-24 DIAGNOSIS — J301 Allergic rhinitis due to pollen: Secondary | ICD-10-CM | POA: Diagnosis not present

## 2024-03-24 DIAGNOSIS — J3089 Other allergic rhinitis: Secondary | ICD-10-CM | POA: Diagnosis not present

## 2024-03-24 DIAGNOSIS — J3081 Allergic rhinitis due to animal (cat) (dog) hair and dander: Secondary | ICD-10-CM | POA: Diagnosis not present

## 2024-03-26 DIAGNOSIS — J301 Allergic rhinitis due to pollen: Secondary | ICD-10-CM | POA: Diagnosis not present

## 2024-03-26 DIAGNOSIS — J3081 Allergic rhinitis due to animal (cat) (dog) hair and dander: Secondary | ICD-10-CM | POA: Diagnosis not present

## 2024-03-26 DIAGNOSIS — J3089 Other allergic rhinitis: Secondary | ICD-10-CM | POA: Diagnosis not present

## 2024-03-29 DIAGNOSIS — J3089 Other allergic rhinitis: Secondary | ICD-10-CM | POA: Diagnosis not present

## 2024-03-29 DIAGNOSIS — J301 Allergic rhinitis due to pollen: Secondary | ICD-10-CM | POA: Diagnosis not present

## 2024-03-29 DIAGNOSIS — J3081 Allergic rhinitis due to animal (cat) (dog) hair and dander: Secondary | ICD-10-CM | POA: Diagnosis not present

## 2024-03-31 DIAGNOSIS — Z79899 Other long term (current) drug therapy: Secondary | ICD-10-CM | POA: Diagnosis not present

## 2024-03-31 DIAGNOSIS — J3089 Other allergic rhinitis: Secondary | ICD-10-CM | POA: Diagnosis not present

## 2024-03-31 DIAGNOSIS — J3081 Allergic rhinitis due to animal (cat) (dog) hair and dander: Secondary | ICD-10-CM | POA: Diagnosis not present

## 2024-03-31 DIAGNOSIS — J301 Allergic rhinitis due to pollen: Secondary | ICD-10-CM | POA: Diagnosis not present

## 2024-04-01 ENCOUNTER — Ambulatory Visit: Payer: Self-pay | Admitting: Cardiovascular Disease

## 2024-04-01 LAB — BASIC METABOLIC PANEL WITH GFR
BUN/Creatinine Ratio: 12 (ref 10–24)
BUN: 14 mg/dL (ref 8–27)
CO2: 20 mmol/L (ref 20–29)
Calcium: 9.5 mg/dL (ref 8.6–10.2)
Chloride: 102 mmol/L (ref 96–106)
Creatinine, Ser: 1.16 mg/dL (ref 0.76–1.27)
Glucose: 84 mg/dL (ref 70–99)
Potassium: 4.4 mmol/L (ref 3.5–5.2)
Sodium: 140 mmol/L (ref 134–144)
eGFR: 70 mL/min/{1.73_m2} (ref >59)

## 2024-04-02 DIAGNOSIS — J301 Allergic rhinitis due to pollen: Secondary | ICD-10-CM | POA: Diagnosis not present

## 2024-04-02 DIAGNOSIS — J3089 Other allergic rhinitis: Secondary | ICD-10-CM | POA: Diagnosis not present

## 2024-04-02 DIAGNOSIS — J3081 Allergic rhinitis due to animal (cat) (dog) hair and dander: Secondary | ICD-10-CM | POA: Diagnosis not present

## 2024-04-05 DIAGNOSIS — J3081 Allergic rhinitis due to animal (cat) (dog) hair and dander: Secondary | ICD-10-CM | POA: Diagnosis not present

## 2024-04-05 DIAGNOSIS — J3089 Other allergic rhinitis: Secondary | ICD-10-CM | POA: Diagnosis not present

## 2024-04-05 DIAGNOSIS — J301 Allergic rhinitis due to pollen: Secondary | ICD-10-CM | POA: Diagnosis not present

## 2024-04-08 DIAGNOSIS — J3081 Allergic rhinitis due to animal (cat) (dog) hair and dander: Secondary | ICD-10-CM | POA: Diagnosis not present

## 2024-04-08 DIAGNOSIS — J301 Allergic rhinitis due to pollen: Secondary | ICD-10-CM | POA: Diagnosis not present

## 2024-04-08 DIAGNOSIS — J3089 Other allergic rhinitis: Secondary | ICD-10-CM | POA: Diagnosis not present

## 2024-04-12 DIAGNOSIS — J3089 Other allergic rhinitis: Secondary | ICD-10-CM | POA: Diagnosis not present

## 2024-04-12 DIAGNOSIS — J301 Allergic rhinitis due to pollen: Secondary | ICD-10-CM | POA: Diagnosis not present

## 2024-04-12 DIAGNOSIS — J3081 Allergic rhinitis due to animal (cat) (dog) hair and dander: Secondary | ICD-10-CM | POA: Diagnosis not present

## 2024-04-15 DIAGNOSIS — J301 Allergic rhinitis due to pollen: Secondary | ICD-10-CM | POA: Diagnosis not present

## 2024-04-15 DIAGNOSIS — J3089 Other allergic rhinitis: Secondary | ICD-10-CM | POA: Diagnosis not present

## 2024-04-15 DIAGNOSIS — J3081 Allergic rhinitis due to animal (cat) (dog) hair and dander: Secondary | ICD-10-CM | POA: Diagnosis not present

## 2024-04-20 DIAGNOSIS — J3081 Allergic rhinitis due to animal (cat) (dog) hair and dander: Secondary | ICD-10-CM | POA: Diagnosis not present

## 2024-04-20 DIAGNOSIS — J301 Allergic rhinitis due to pollen: Secondary | ICD-10-CM | POA: Diagnosis not present

## 2024-04-20 DIAGNOSIS — J3089 Other allergic rhinitis: Secondary | ICD-10-CM | POA: Diagnosis not present

## 2024-04-26 DIAGNOSIS — J3081 Allergic rhinitis due to animal (cat) (dog) hair and dander: Secondary | ICD-10-CM | POA: Diagnosis not present

## 2024-04-26 DIAGNOSIS — J3089 Other allergic rhinitis: Secondary | ICD-10-CM | POA: Diagnosis not present

## 2024-04-26 DIAGNOSIS — J301 Allergic rhinitis due to pollen: Secondary | ICD-10-CM | POA: Diagnosis not present

## 2024-05-03 DIAGNOSIS — J3089 Other allergic rhinitis: Secondary | ICD-10-CM | POA: Diagnosis not present

## 2024-05-03 DIAGNOSIS — J3081 Allergic rhinitis due to animal (cat) (dog) hair and dander: Secondary | ICD-10-CM | POA: Diagnosis not present

## 2024-05-03 DIAGNOSIS — J301 Allergic rhinitis due to pollen: Secondary | ICD-10-CM | POA: Diagnosis not present

## 2024-05-10 DIAGNOSIS — J301 Allergic rhinitis due to pollen: Secondary | ICD-10-CM | POA: Diagnosis not present

## 2024-05-10 DIAGNOSIS — J3089 Other allergic rhinitis: Secondary | ICD-10-CM | POA: Diagnosis not present

## 2024-05-10 DIAGNOSIS — J3081 Allergic rhinitis due to animal (cat) (dog) hair and dander: Secondary | ICD-10-CM | POA: Diagnosis not present

## 2024-05-17 DIAGNOSIS — J3089 Other allergic rhinitis: Secondary | ICD-10-CM | POA: Diagnosis not present

## 2024-05-17 DIAGNOSIS — J301 Allergic rhinitis due to pollen: Secondary | ICD-10-CM | POA: Diagnosis not present

## 2024-05-17 DIAGNOSIS — J3081 Allergic rhinitis due to animal (cat) (dog) hair and dander: Secondary | ICD-10-CM | POA: Diagnosis not present

## 2024-05-24 DIAGNOSIS — J3089 Other allergic rhinitis: Secondary | ICD-10-CM | POA: Diagnosis not present

## 2024-05-24 DIAGNOSIS — J301 Allergic rhinitis due to pollen: Secondary | ICD-10-CM | POA: Diagnosis not present

## 2024-05-24 DIAGNOSIS — J3081 Allergic rhinitis due to animal (cat) (dog) hair and dander: Secondary | ICD-10-CM | POA: Diagnosis not present

## 2024-05-31 DIAGNOSIS — J3089 Other allergic rhinitis: Secondary | ICD-10-CM | POA: Diagnosis not present

## 2024-05-31 DIAGNOSIS — J301 Allergic rhinitis due to pollen: Secondary | ICD-10-CM | POA: Diagnosis not present

## 2024-05-31 DIAGNOSIS — J3081 Allergic rhinitis due to animal (cat) (dog) hair and dander: Secondary | ICD-10-CM | POA: Diagnosis not present

## 2024-06-09 DIAGNOSIS — J301 Allergic rhinitis due to pollen: Secondary | ICD-10-CM | POA: Diagnosis not present

## 2024-06-09 DIAGNOSIS — J3089 Other allergic rhinitis: Secondary | ICD-10-CM | POA: Diagnosis not present

## 2024-06-09 DIAGNOSIS — J3081 Allergic rhinitis due to animal (cat) (dog) hair and dander: Secondary | ICD-10-CM | POA: Diagnosis not present

## 2024-06-14 DIAGNOSIS — J3089 Other allergic rhinitis: Secondary | ICD-10-CM | POA: Diagnosis not present

## 2024-06-14 DIAGNOSIS — J3081 Allergic rhinitis due to animal (cat) (dog) hair and dander: Secondary | ICD-10-CM | POA: Diagnosis not present

## 2024-06-14 DIAGNOSIS — J301 Allergic rhinitis due to pollen: Secondary | ICD-10-CM | POA: Diagnosis not present

## 2024-06-21 ENCOUNTER — Telehealth: Payer: Self-pay | Admitting: Cardiovascular Disease

## 2024-06-21 DIAGNOSIS — J301 Allergic rhinitis due to pollen: Secondary | ICD-10-CM | POA: Diagnosis not present

## 2024-06-21 DIAGNOSIS — J3089 Other allergic rhinitis: Secondary | ICD-10-CM | POA: Diagnosis not present

## 2024-06-21 DIAGNOSIS — J3081 Allergic rhinitis due to animal (cat) (dog) hair and dander: Secondary | ICD-10-CM | POA: Diagnosis not present

## 2024-06-21 MED ORDER — ATORVASTATIN CALCIUM 20 MG PO TABS: 20.0000 mg | ORAL_TABLET | Freq: Every day | ORAL | 3 refills | Status: AC

## 2024-06-21 NOTE — Telephone Encounter (Signed)
 Patient aware medication refilled and e-sent to pharmacy

## 2024-06-21 NOTE — Telephone Encounter (Signed)
 *  STAT* If patient is at the pharmacy, call can be transferred to refill team.   1. Which medications need to be refilled? (please list name of each medication and dose if known)   atorvastatin  (LIPITOR) 20 MG tablet   2. Which pharmacy/location (including street and city if local pharmacy) is medication to be sent to?  [id number] - [place], [place] - [address]    3. Do they need a 30 day or 90 day supply? 90

## 2024-06-28 DIAGNOSIS — J3089 Other allergic rhinitis: Secondary | ICD-10-CM | POA: Diagnosis not present

## 2024-06-28 DIAGNOSIS — J301 Allergic rhinitis due to pollen: Secondary | ICD-10-CM | POA: Diagnosis not present

## 2024-06-28 DIAGNOSIS — J3081 Allergic rhinitis due to animal (cat) (dog) hair and dander: Secondary | ICD-10-CM | POA: Diagnosis not present

## 2024-07-05 DIAGNOSIS — J3081 Allergic rhinitis due to animal (cat) (dog) hair and dander: Secondary | ICD-10-CM | POA: Diagnosis not present

## 2024-07-05 DIAGNOSIS — J301 Allergic rhinitis due to pollen: Secondary | ICD-10-CM | POA: Diagnosis not present

## 2024-07-05 DIAGNOSIS — J3089 Other allergic rhinitis: Secondary | ICD-10-CM | POA: Diagnosis not present

## 2024-07-12 DIAGNOSIS — J3089 Other allergic rhinitis: Secondary | ICD-10-CM | POA: Diagnosis not present

## 2024-07-12 DIAGNOSIS — J301 Allergic rhinitis due to pollen: Secondary | ICD-10-CM | POA: Diagnosis not present

## 2024-07-12 DIAGNOSIS — J3081 Allergic rhinitis due to animal (cat) (dog) hair and dander: Secondary | ICD-10-CM | POA: Diagnosis not present

## 2024-07-14 DIAGNOSIS — J301 Allergic rhinitis due to pollen: Secondary | ICD-10-CM | POA: Diagnosis not present

## 2024-07-19 DIAGNOSIS — J3089 Other allergic rhinitis: Secondary | ICD-10-CM | POA: Diagnosis not present

## 2024-07-19 DIAGNOSIS — J3081 Allergic rhinitis due to animal (cat) (dog) hair and dander: Secondary | ICD-10-CM | POA: Diagnosis not present

## 2024-07-19 DIAGNOSIS — J301 Allergic rhinitis due to pollen: Secondary | ICD-10-CM | POA: Diagnosis not present

## 2024-07-26 DIAGNOSIS — J3089 Other allergic rhinitis: Secondary | ICD-10-CM | POA: Diagnosis not present

## 2024-07-26 DIAGNOSIS — J301 Allergic rhinitis due to pollen: Secondary | ICD-10-CM | POA: Diagnosis not present

## 2024-07-26 DIAGNOSIS — J3081 Allergic rhinitis due to animal (cat) (dog) hair and dander: Secondary | ICD-10-CM | POA: Diagnosis not present

## 2024-08-02 DIAGNOSIS — J3089 Other allergic rhinitis: Secondary | ICD-10-CM | POA: Diagnosis not present

## 2024-08-02 DIAGNOSIS — J301 Allergic rhinitis due to pollen: Secondary | ICD-10-CM | POA: Diagnosis not present

## 2024-08-02 DIAGNOSIS — J3081 Allergic rhinitis due to animal (cat) (dog) hair and dander: Secondary | ICD-10-CM | POA: Diagnosis not present

## 2024-08-23 DIAGNOSIS — J301 Allergic rhinitis due to pollen: Secondary | ICD-10-CM | POA: Diagnosis not present

## 2024-08-23 DIAGNOSIS — J3089 Other allergic rhinitis: Secondary | ICD-10-CM | POA: Diagnosis not present

## 2024-08-23 DIAGNOSIS — J3081 Allergic rhinitis due to animal (cat) (dog) hair and dander: Secondary | ICD-10-CM | POA: Diagnosis not present

## 2024-08-27 DIAGNOSIS — J301 Allergic rhinitis due to pollen: Secondary | ICD-10-CM | POA: Diagnosis not present

## 2024-08-27 DIAGNOSIS — J4520 Mild intermittent asthma, uncomplicated: Secondary | ICD-10-CM | POA: Diagnosis not present

## 2024-08-27 DIAGNOSIS — H1045 Other chronic allergic conjunctivitis: Secondary | ICD-10-CM | POA: Diagnosis not present

## 2024-08-27 DIAGNOSIS — J3089 Other allergic rhinitis: Secondary | ICD-10-CM | POA: Diagnosis not present

## 2024-08-30 DIAGNOSIS — J301 Allergic rhinitis due to pollen: Secondary | ICD-10-CM | POA: Diagnosis not present

## 2024-08-30 DIAGNOSIS — J3081 Allergic rhinitis due to animal (cat) (dog) hair and dander: Secondary | ICD-10-CM | POA: Diagnosis not present

## 2024-08-30 DIAGNOSIS — J3089 Other allergic rhinitis: Secondary | ICD-10-CM | POA: Diagnosis not present

## 2024-09-01 DIAGNOSIS — J019 Acute sinusitis, unspecified: Secondary | ICD-10-CM | POA: Diagnosis not present

## 2024-09-01 DIAGNOSIS — R051 Acute cough: Secondary | ICD-10-CM | POA: Diagnosis not present

## 2024-09-01 DIAGNOSIS — R0981 Nasal congestion: Secondary | ICD-10-CM | POA: Diagnosis not present

## 2024-09-09 DIAGNOSIS — J3081 Allergic rhinitis due to animal (cat) (dog) hair and dander: Secondary | ICD-10-CM | POA: Diagnosis not present

## 2024-09-09 DIAGNOSIS — J3089 Other allergic rhinitis: Secondary | ICD-10-CM | POA: Diagnosis not present

## 2024-09-09 DIAGNOSIS — J301 Allergic rhinitis due to pollen: Secondary | ICD-10-CM | POA: Diagnosis not present

## 2024-09-12 NOTE — Progress Notes (Signed)
  Cardiology Office Note:   Date:  09/12/2024  ID:  [name], DOB [birth year], MRN [id number] PCP: Katina Pfeiffer, PA-C  [facility] Providers Cardiologist:  None { Chief Complaint: No chief complaint on file.     History of Present Illness:   [name] is a 63 y.o. male with a PMH of CAD s/p CABG (2008), HTN, HLD, obesity who presents for follow up.  Last seen by cardiology 1 year ago.   Past Medical History:  Diagnosis Date   Allergy    Asthma    CAD (coronary artery disease)    Hyperlipidemia    Hypertension    Left inguinal hernia    Postsurgical aortocoronary bypass status    Right inguinal hernia    Sleep apnea    Squamous cell carcinoma of skin 02/26/1999   bowens- right shoulder bak, inf (CX35FU)   Squamous cell carcinoma of skin 02/26/1999   right shoulder bak, sup (CX35FU)     Studies Reviewed:    EKG: ***           Risk Assessment/Calculations:   {Does this patient have ATRIAL FIBRILLATION?:[id number]} No BP recorded.  {Refresh Note OR Click here to enter BP  :1}***        Physical Exam:     VS:  There were no vitals taken for this visit. ***    Wt Readings from Last 3 Encounters:  03/08/24 218 lb 3.2 oz (99 kg)  03/07/23 208 lb (94.3 kg)  01/18/22 210 lb 12.8 oz (95.6 kg)     GEN: Well nourished, well developed, in no acute distress NECK: No JVD; No carotid bruits CARDIAC: ***RRR, no murmurs, rubs, gallops RESPIRATORY:  Clear to auscultation without rales, wheezing or rhonchi  ABDOMEN: Soft, non-tender, non-distended, normal bowel sounds EXTREMITIES:  Warm and well perfused, no edema; No deformity, 2+ radial pulses PSYCH: Normal mood and affect   Assessment & Plan Coronary artery disease involving coronary bypass graft of native heart without angina pectoris  Primary hypertension  Mixed hyperlipidemia       {Are you ordering a CV Procedure (e.g. stress test, cath, DCCV, TEE, etc)?   Press F2         :[id number]}   This note was written with the assistance of a dictation microphone or AI dictation software. Please excuse any typos or grammatical errors.   Signed, Gary Archer, MD 09/12/2024 8:07 PM     [facility]

## 2024-09-13 ENCOUNTER — Ambulatory Visit
Attending: Student in an Organized Health Care Education/Training Program | Admitting: Student in an Organized Health Care Education/Training Program

## 2024-09-13 ENCOUNTER — Encounter: Payer: Self-pay | Admitting: Student in an Organized Health Care Education/Training Program

## 2024-09-13 VITALS — BP 110/76 | HR 77 | Ht 69.0 in | Wt 215.2 lb

## 2024-09-13 DIAGNOSIS — E782 Mixed hyperlipidemia: Secondary | ICD-10-CM

## 2024-09-13 DIAGNOSIS — I10 Essential (primary) hypertension: Secondary | ICD-10-CM

## 2024-09-13 DIAGNOSIS — Z131 Encounter for screening for diabetes mellitus: Secondary | ICD-10-CM | POA: Diagnosis not present

## 2024-09-13 DIAGNOSIS — I25810 Atherosclerosis of coronary artery bypass graft(s) without angina pectoris: Secondary | ICD-10-CM | POA: Diagnosis not present

## 2024-09-13 LAB — LIPID PANEL
Chol/HDL Ratio: 2.1 ratio (ref 0.0–5.0)
Cholesterol, Total: 126 mg/dL (ref 100–199)
HDL: 59 mg/dL (ref >39)
LDL Chol Calc (NIH): 53 mg/dL (ref 0–99)
Triglycerides: 64 mg/dL (ref 0–149)
VLDL Cholesterol Cal: 14 mg/dL (ref 5–40)

## 2024-09-13 LAB — BASIC METABOLIC PANEL WITH GFR
BUN/Creatinine Ratio: 14 (ref 10–24)
BUN: 17 mg/dL (ref 8–27)
CO2: 26 mmol/L (ref 20–29)
Calcium: 9.6 mg/dL (ref 8.6–10.2)
Chloride: 104 mmol/L (ref 96–106)
Creatinine, Ser: 1.24 mg/dL (ref 0.76–1.27)
Glucose: 92 mg/dL (ref 70–99)
Potassium: 5 mmol/L (ref 3.5–5.2)
Sodium: 141 mmol/L (ref 134–144)
eGFR: 65 mL/min/1.73 (ref >59)

## 2024-09-13 LAB — HEMOGLOBIN A1C
Est. average glucose Bld gHb Est-mCnc: 111 mg/dL
Hgb A1c MFr Bld: 5.5 % (ref 4.8–5.6)

## 2024-09-13 MED ORDER — NITROGLYCERIN 0.4 MG SL SUBL: 0.4000 mg | SUBLINGUAL_TABLET | SUBLINGUAL | 3 refills | Status: AC | PRN

## 2024-09-13 NOTE — Assessment & Plan Note (Signed)
-   Doing well without symptoms.  Remains very active.  No changes. Continue aspirin  81 mg indefinitely Refilling sublingual nitroglycerin  tablets Follow-up in 12 months

## 2024-09-13 NOTE — Patient Instructions (Signed)
  Lab Work: LIPID PANEL BMP HGB A1C   If you have labs (blood work) drawn today and your tests are completely normal, you will receive your results only by: MyChart Message (if you have MyChart) OR A paper copy in the mail If you have any lab test that is abnormal or we need to change your treatment, we will call you to review the results.   Follow-Up: At [facility], you and your health needs are our priority.  As part of our continuing mission to provide you with exceptional heart care, our providers are all part of one team.  This team includes your primary Cardiologist (physician) and Advanced Practice Providers or APPs (Physician Assistants and Nurse Practitioners) who all work together to provide you with the care you need, when you need it.  Your next appointment:   1 year(s)  Provider:   Georganna Archer, MD

## 2024-09-13 NOTE — Assessment & Plan Note (Signed)
-   BP is at goal.  No changes. Continue losartan  100 mg daily Check BMP

## 2024-09-13 NOTE — Assessment & Plan Note (Signed)
 Lipid panel

## 2024-09-14 ENCOUNTER — Ambulatory Visit: Payer: Self-pay | Admitting: Student in an Organized Health Care Education/Training Program

## 2024-09-16 DIAGNOSIS — J301 Allergic rhinitis due to pollen: Secondary | ICD-10-CM | POA: Diagnosis not present

## 2024-09-16 DIAGNOSIS — J3089 Other allergic rhinitis: Secondary | ICD-10-CM | POA: Diagnosis not present

## 2024-09-16 DIAGNOSIS — J3081 Allergic rhinitis due to animal (cat) (dog) hair and dander: Secondary | ICD-10-CM | POA: Diagnosis not present

## 2024-09-23 DIAGNOSIS — J301 Allergic rhinitis due to pollen: Secondary | ICD-10-CM | POA: Diagnosis not present

## 2024-09-23 DIAGNOSIS — J3089 Other allergic rhinitis: Secondary | ICD-10-CM | POA: Diagnosis not present

## 2024-09-23 DIAGNOSIS — J3081 Allergic rhinitis due to animal (cat) (dog) hair and dander: Secondary | ICD-10-CM | POA: Diagnosis not present

## 2024-10-01 DIAGNOSIS — J3089 Other allergic rhinitis: Secondary | ICD-10-CM | POA: Diagnosis not present

## 2024-10-01 DIAGNOSIS — J301 Allergic rhinitis due to pollen: Secondary | ICD-10-CM | POA: Diagnosis not present

## 2024-10-01 DIAGNOSIS — J3081 Allergic rhinitis due to animal (cat) (dog) hair and dander: Secondary | ICD-10-CM | POA: Diagnosis not present
